# Patient Record
Sex: Male | Born: 1955 | Race: Black or African American | Hispanic: No | Marital: Single | Smoking: Never smoker
Health system: Southern US, Community
[De-identification: ages and names within clinical notes are randomized; demographics above are authoritative.]

## PROBLEM LIST (undated history)

## (undated) DIAGNOSIS — I10 Essential (primary) hypertension: Secondary | ICD-10-CM

## (undated) DIAGNOSIS — I499 Cardiac arrhythmia, unspecified: Secondary | ICD-10-CM

## (undated) DIAGNOSIS — K922 Gastrointestinal hemorrhage, unspecified: Secondary | ICD-10-CM

## (undated) DIAGNOSIS — G4730 Sleep apnea, unspecified: Secondary | ICD-10-CM

## (undated) DIAGNOSIS — M109 Gout, unspecified: Secondary | ICD-10-CM

## (undated) DIAGNOSIS — I509 Heart failure, unspecified: Secondary | ICD-10-CM

## (undated) DIAGNOSIS — M1990 Unspecified osteoarthritis, unspecified site: Secondary | ICD-10-CM

## (undated) DIAGNOSIS — M75101 Unspecified rotator cuff tear or rupture of right shoulder, not specified as traumatic: Secondary | ICD-10-CM

## (undated) HISTORY — PX: JOINT REPLACEMENT: SHX530

## (undated) HISTORY — PX: ORIF ANKLE FRACTURE: SHX5408

## (undated) HISTORY — PX: FRACTURE SURGERY: SHX138

## (undated) HISTORY — PX: FOOT SURGERY: SHX648

---

## 2007-02-09 ENCOUNTER — Emergency Department (HOSPITAL_COMMUNITY): Admission: EM | Admit: 2007-02-09 | Discharge: 2007-02-09 | Payer: Self-pay | Admitting: Emergency Medicine

## 2007-02-13 ENCOUNTER — Emergency Department (HOSPITAL_COMMUNITY): Admission: EM | Admit: 2007-02-13 | Discharge: 2007-02-13 | Payer: Self-pay | Admitting: Emergency Medicine

## 2009-12-12 ENCOUNTER — Emergency Department (HOSPITAL_COMMUNITY): Admission: EM | Admit: 2009-12-12 | Discharge: 2009-12-12 | Payer: Self-pay | Admitting: Emergency Medicine

## 2010-10-16 LAB — DIFFERENTIAL
Basophils Absolute: 0 10*3/uL (ref 0.0–0.1)
Eosinophils Absolute: 0.1 10*3/uL (ref 0.0–0.7)
Lymphocytes Relative: 17 % (ref 12–46)
Lymphs Abs: 1 10*3/uL (ref 0.7–4.0)
Monocytes Absolute: 0.5 10*3/uL (ref 0.1–1.0)
Monocytes Relative: 9 % (ref 3–12)

## 2010-10-16 LAB — POCT I-STAT, CHEM 8
BUN: 16 mg/dL (ref 6–23)
Calcium, Ion: 1.1 mmol/L — ABNORMAL LOW (ref 1.12–1.32)
Chloride: 101 mEq/L (ref 96–112)
Creatinine, Ser: 1.3 mg/dL (ref 0.4–1.5)
Hemoglobin: 12.6 g/dL — ABNORMAL LOW (ref 13.0–17.0)
Potassium: 3.4 mEq/L — ABNORMAL LOW (ref 3.5–5.1)
Sodium: 138 mEq/L (ref 135–145)

## 2010-10-16 LAB — CBC: WBC: 6.1 10*3/uL (ref 4.0–10.5)

## 2010-10-18 ENCOUNTER — Emergency Department (HOSPITAL_COMMUNITY)
Admission: EM | Admit: 2010-10-18 | Discharge: 2010-10-18 | Disposition: A | Discharge status: Home / Self Care | Payer: Medicare Other | Attending: Emergency Medicine | Admitting: Emergency Medicine

## 2010-10-18 ENCOUNTER — Emergency Department (HOSPITAL_COMMUNITY): Payer: Medicare Other

## 2010-10-18 DIAGNOSIS — S93409A Sprain of unspecified ligament of unspecified ankle, initial encounter: Secondary | ICD-10-CM | POA: Insufficient documentation

## 2010-10-18 DIAGNOSIS — M7989 Other specified soft tissue disorders: Secondary | ICD-10-CM | POA: Insufficient documentation

## 2010-10-18 DIAGNOSIS — M25579 Pain in unspecified ankle and joints of unspecified foot: Secondary | ICD-10-CM | POA: Insufficient documentation

## 2010-10-18 DIAGNOSIS — I10 Essential (primary) hypertension: Secondary | ICD-10-CM | POA: Insufficient documentation

## 2010-10-18 DIAGNOSIS — X500XXA Overexertion from strenuous movement or load, initial encounter: Secondary | ICD-10-CM | POA: Insufficient documentation

## 2010-11-24 ENCOUNTER — Other Ambulatory Visit: Payer: Self-pay | Admitting: Orthopedic Surgery

## 2010-11-24 DIAGNOSIS — M25571 Pain in right ankle and joints of right foot: Secondary | ICD-10-CM

## 2010-11-26 ENCOUNTER — Ambulatory Visit
Admission: RE | Admit: 2010-11-26 | Discharge: 2010-11-26 | Disposition: A | Discharge status: Home / Self Care | Payer: Medicaid Other | Source: Ambulatory Visit | Attending: Orthopedic Surgery | Admitting: Orthopedic Surgery

## 2010-11-26 DIAGNOSIS — M25571 Pain in right ankle and joints of right foot: Secondary | ICD-10-CM

## 2010-11-29 ENCOUNTER — Observation Stay (HOSPITAL_COMMUNITY)
Admission: RE | Admit: 2010-11-29 | Discharge: 2010-11-30 | Disposition: A | Discharge status: Home / Self Care | Payer: Medicare Other | Source: Ambulatory Visit | Attending: Orthopedic Surgery | Admitting: Orthopedic Surgery

## 2010-11-29 ENCOUNTER — Ambulatory Visit (HOSPITAL_COMMUNITY): Payer: Medicare Other

## 2010-11-29 DIAGNOSIS — Z01818 Encounter for other preprocedural examination: Secondary | ICD-10-CM | POA: Insufficient documentation

## 2010-11-29 DIAGNOSIS — Z79899 Other long term (current) drug therapy: Secondary | ICD-10-CM | POA: Insufficient documentation

## 2010-11-29 DIAGNOSIS — M109 Gout, unspecified: Secondary | ICD-10-CM | POA: Insufficient documentation

## 2010-11-29 DIAGNOSIS — Z01812 Encounter for preprocedural laboratory examination: Secondary | ICD-10-CM | POA: Insufficient documentation

## 2010-11-29 DIAGNOSIS — I10 Essential (primary) hypertension: Secondary | ICD-10-CM | POA: Insufficient documentation

## 2010-11-29 DIAGNOSIS — E669 Obesity, unspecified: Secondary | ICD-10-CM | POA: Insufficient documentation

## 2010-11-29 DIAGNOSIS — G4733 Obstructive sleep apnea (adult) (pediatric): Secondary | ICD-10-CM | POA: Insufficient documentation

## 2010-11-29 DIAGNOSIS — M129 Arthropathy, unspecified: Secondary | ICD-10-CM | POA: Insufficient documentation

## 2010-11-29 DIAGNOSIS — M66369 Spontaneous rupture of flexor tendons, unspecified lower leg: Principal | ICD-10-CM | POA: Insufficient documentation

## 2010-11-29 DIAGNOSIS — Z01810 Encounter for preprocedural cardiovascular examination: Secondary | ICD-10-CM | POA: Insufficient documentation

## 2010-11-29 LAB — CBC
Hemoglobin: 14.6 g/dL (ref 13.0–17.0)
MCHC: 34.8 g/dL (ref 30.0–36.0)
Platelets: 158 10*3/uL (ref 150–400)
RBC: 4.88 MIL/uL (ref 4.22–5.81)
WBC: 4.7 10*3/uL (ref 4.0–10.5)

## 2010-11-29 LAB — COMPREHENSIVE METABOLIC PANEL
AST: 18 U/L (ref 0–37)
Albumin: 3.6 g/dL (ref 3.5–5.2)
CO2: 28 mEq/L (ref 19–32)
Calcium: 9.6 mg/dL (ref 8.4–10.5)
Glucose, Bld: 104 mg/dL — ABNORMAL HIGH (ref 70–99)
Sodium: 138 mEq/L (ref 135–145)
Total Bilirubin: 1.1 mg/dL (ref 0.3–1.2)

## 2010-11-29 LAB — PROTIME-INR
INR: 1.03 (ref 0.00–1.49)
Prothrombin Time: 13.7 seconds (ref 11.6–15.2)

## 2010-11-29 LAB — APTT: aPTT: 32 seconds (ref 24–37)

## 2010-12-12 NOTE — Op Note (Signed)
[name], Leon Thomas                  ACCOUNT NO.:  [id number]  MEDICAL RECORD NO.:  [id number]           PATIENT TYPE:  O  LOCATION:  5004                         FACILITY:  [facility]  PHYSICIAN:  Nadara Mustard, MD     DATE OF BIRTH:  [birth year]  DATE OF PROCEDURE:  11/29/2010 DATE OF DISCHARGE:                              OPERATIVE REPORT   PREOPERATIVE DIAGNOSIS:  Chronic right Achilles tendon rupture.  POSTOPERATIVE DIAGNOSIS:  Chronic right Achilles tendon rupture.  PROCEDURE:  Right Achilles tendon reconstruction with reinforcement with ACell Xenograft tissue.  SURGEON.:  Nadara Mustard, MD  ANESTHESIA:  General plus popliteal block.  ESTIMATED BLOOD LOSS:  Minimal.  ANTIBIOTICS:  2 g of Kefzol.  DRAINS:  None.  COMPLICATIONS:  None.  TOURNIQUET TIME:  None.  DISPOSITION:  To PACU in stable condition.  INDICATIONS FOR PROCEDURE:  The patient is a 55 year old gentleman who has had a chronic Achilles tendon rupture.  MRI scan confirms complete rupture of the tendon.  Due to his pain with activities of daily living, the patient wished to proceed with surgical intervention.  Risks and benefits were discussed including infection, neurovascular injury, nonhealing of the wound, need for additional surgery.  The patient states he understands and wished to proceed at this time.  DESCRIPTION OF PROCEDURE:  The patient was brought to OR room 10 after undergone a popliteal block.  He then underwent a general anesthetic. After adequate level of anesthesia was obtained, the patient's right lower extremity was prepped using DuraPrep and draped into a sterile field.  A posterior medial incision was made.  This was carried down to the peritenon which was incised, reflected.  The patient had a chronic tear of the Achilles.  He underwent debridement and excision of the chronic fibrinous tissue.  After debridement, the wound edges and tissue edges were debrided.  The patient had good  apposition of the tendon using a crack out suture technique with #2 FiberWire.  Two FiberWires were sutured using crack out technique in the proximal and distal stumps.  This provided all 4 sutures exiting the proximal and distal stumps.  The foot was placed in relaxed plantar flexion and the Achilles was repaired with no tension on the Achilles with the foot in relaxed position.  The ACell graft was soaked on the back table.  This was fenestrated and then was wrapped around the tendon reconstruction and sutured in place with 3-0 Monocryl.  Subcu was closed using 2-0 Vicryl and the skin was closed using 2-0 nylon with a modified Allgower-Donati suture technique with no sutures crossing the skin.  The wound edges closed well.  The patient did have a significant edema in his leg.  A compressive wrap was applied with Adaptic, 4x4s, Webril, Coban and then a posterior and sugar-tong splint was applied.  The ACell graft which was a 7 x 10 cm was also utilized with the 500 g of the ACell powder and this was placed against the repair beneath the ACell tissue graft.  The patient was extubated and taken to PACU in stable condition.  Plan  for overnight observation due to his sleep apnea and use of the CPAP machine.  Plan to follow up in the office in 2 weeks.     Nadara Mustard, MD     MVD/MEDQ  D:  11/29/2010  T:  11/30/2010  Job:  [id number]  Electronically Signed by Aldean Baker MD on 12/12/2010 10:14:23 AM

## 2011-05-15 LAB — URINALYSIS, ROUTINE W REFLEX MICROSCOPIC
Bilirubin Urine: NEGATIVE
Glucose, UA: NEGATIVE
Hgb urine dipstick: NEGATIVE
Specific Gravity, Urine: 1.019
pH: 5.5

## 2011-05-15 LAB — DIFFERENTIAL
Basophils Absolute: 0
Basophils Relative: 0
Eosinophils Absolute: 0.1
Eosinophils Relative: 1
Lymphocytes Relative: 11 — ABNORMAL LOW
Lymphs Abs: 1
Neutrophils Relative %: 84 — ABNORMAL HIGH

## 2011-05-15 LAB — CBC
HCT: 40.1
MCHC: 35.2
MCV: 84.7
Platelets: 171
WBC: 9.2

## 2011-05-15 LAB — BASIC METABOLIC PANEL
GFR calc non Af Amer: >60
Glucose, Bld: 129 — ABNORMAL HIGH
Potassium: 3.4 — ABNORMAL LOW
Sodium: 140

## 2011-05-15 LAB — URINE MICROSCOPIC-ADD ON

## 2012-10-09 ENCOUNTER — Encounter (HOSPITAL_COMMUNITY): Payer: Self-pay | Admitting: Cardiology

## 2012-10-09 ENCOUNTER — Emergency Department (HOSPITAL_COMMUNITY)
Admission: EM | Admit: 2012-10-09 | Discharge: 2012-10-09 | Disposition: A | Discharge status: Home / Self Care | Payer: Medicare Other | Attending: Emergency Medicine | Admitting: Emergency Medicine

## 2012-10-09 DIAGNOSIS — Z79899 Other long term (current) drug therapy: Secondary | ICD-10-CM | POA: Insufficient documentation

## 2012-10-09 DIAGNOSIS — H539 Unspecified visual disturbance: Secondary | ICD-10-CM | POA: Insufficient documentation

## 2012-10-09 DIAGNOSIS — Z7982 Long term (current) use of aspirin: Secondary | ICD-10-CM | POA: Insufficient documentation

## 2012-10-09 DIAGNOSIS — I10 Essential (primary) hypertension: Secondary | ICD-10-CM

## 2012-10-09 DIAGNOSIS — M109 Gout, unspecified: Secondary | ICD-10-CM | POA: Insufficient documentation

## 2012-10-09 DIAGNOSIS — M129 Arthropathy, unspecified: Secondary | ICD-10-CM | POA: Insufficient documentation

## 2012-10-09 HISTORY — DX: Unspecified osteoarthritis, unspecified site: M19.90

## 2012-10-09 HISTORY — DX: Essential (primary) hypertension: I10

## 2012-10-09 HISTORY — DX: Gout, unspecified: M10.9

## 2012-10-09 NOTE — ED Notes (Signed)
Pt reports his BP has been high since Monday night. States he has been taking his medication but seems to not be working. States he has had some spots in his vision over the past couple of days. No neuro deficits noted. Pt A&Ox4. No distress noted. Also reports left knee pain and swelling.

## 2012-10-09 NOTE — ED Notes (Signed)
Patient states he has not taken his BP he just knows his pressure has been running high. States even with taking his medication his BP at the DR. Office last visit was 190's.

## 2012-10-15 NOTE — ED Provider Notes (Signed)
History     CSN: [id number]  Arrival date & time 10/09/12  1509   First MD Initiated Contact with Patient 10/09/12 1906      Chief Complaint  Patient presents with  . Hypertension    (Consider location/radiation/quality/duration/timing/severity/associated sxs/prior treatment) Patient is a 57 y.o. male presenting with hypertension. The history is provided by the patient.  Hypertension Pertinent negatives include no chest pain, no abdominal pain, no headaches and no shortness of breath.   patient with long-standing history of hypertension. Patient blood pressure that high since Monday night. Has been taking his medications as directed. Blood pressure does remain high. No cystic symptoms. Patient denies headache chest pain shortness of breath or any strokelike symptoms. He has had some 2 visual spots for the past couple days but none now.  Past Medical History  Diagnosis Date  . Hypertension   . Gout   . Arthritis     History reviewed. No pertinent past surgical history.  History reviewed. No pertinent family history.  History  Substance Use Topics  . Smoking status: Never Smoker   . Smokeless tobacco: Not on file  . Alcohol Use: Yes      Review of Systems  Constitutional: Negative for fever.  HENT: Negative for congestion.   Eyes: Positive for visual disturbance.  Respiratory: Negative for shortness of breath.   Cardiovascular: Negative for chest pain.  Gastrointestinal: Negative for nausea, vomiting and abdominal pain.  Genitourinary: Negative for dysuria.  Musculoskeletal: Negative for back pain.  Skin: Negative for rash.  Neurological: Negative for syncope, weakness, numbness and headaches.  Psychiatric/Behavioral: Negative for confusion.    Allergies  Review of patient's allergies indicates no known allergies.  Home Medications   Current Outpatient Rx  Name  Route  Sig  Dispense  Refill  . allopurinol (ZYLOPRIM) 300 MG tablet   Oral   Take 300 mg by  mouth daily.         Marland Kitchen aspirin EC 325 MG tablet   Oral   Take 325 mg by mouth daily.         Marland Kitchen atenolol (TENORMIN) 100 MG tablet   Oral   Take 100 mg by mouth daily.         . furosemide (LASIX) 80 MG tablet   Oral   Take 80 mg by mouth daily.         . hydrALAZINE (APRESOLINE) 100 MG tablet   Oral   Take 100 mg by mouth 3 (three) times daily.         Marland Kitchen HYDROcodone-acetaminophen (NORCO) 10-325 MG per tablet   Oral   Take 1 tablet by mouth every 6 (six) hours as needed for pain. For pain         . polyethylene glycol (MIRALAX / GLYCOLAX) packet   Oral   Take 17 g by mouth daily as needed. For constipation           BP 158/108  Pulse 70  Temp(Src) 98 F (36.7 C) (Oral)  Resp 18  SpO2 99%  Physical Exam  Nursing note and vitals reviewed. Constitutional: He is oriented to person, place, and time. He appears well-developed and well-nourished. No distress.  HENT:  Head: Normocephalic and atraumatic.  Mouth/Throat: Oropharynx is clear and moist.  Eyes: Conjunctivae and EOM are normal. Pupils are equal, round, and reactive to light. Right eye exhibits no discharge. Left eye exhibits no discharge. No scleral icterus.  Neck: Normal range of motion. Neck supple.  Cardiovascular:  Normal rate, regular rhythm, normal heart sounds and intact distal pulses.   No murmur heard. Pulmonary/Chest: Effort normal and breath sounds normal.  Abdominal: Soft. Bowel sounds are normal. There is no tenderness.  Musculoskeletal: Normal range of motion.  Neurological: He is alert and oriented to person, place, and time. No cranial nerve deficit. He exhibits normal muscle tone. Coordination normal.  Skin: Skin is warm. No rash noted.    ED Course  Procedures (including critical care time)  Labs Reviewed - No data to display No results found. Results for orders placed during the hospital encounter of 11/29/10  SURGICAL PCR SCREEN      Result Value Range   MRSA, PCR POSITIVE  (*) NEGATIVE   Staphylococcus aureus   (*) NEGATIVE   Value: POSITIVE            The Xpert SA Assay (FDA     approved for NASAL specimens     only), is one component of     a comprehensive surveillance     program.  It is not intended     to diagnose infection nor to     guide or monitor treatment.  CBC      Result Value Range   WBC 4.7  4.0 - 10.5 K/uL   RBC 4.88  4.22 - 5.81 MIL/uL   Hemoglobin 14.6  13.0 - 17.0 g/dL   HCT 40.9  81.1 - 91.4 %   MCV 85.9  78.0 - 100.0 fL   MCH 29.9  26.0 - 34.0 pg   MCHC 34.8  30.0 - 36.0 g/dL   RDW 78.2  95.6 - 21.3 %   Platelets 158  150 - 400 K/uL  PROTIME-INR      Result Value Range   Prothrombin Time 13.7  11.6 - 15.2 seconds   INR 1.03  0.00 - 1.49  APTT      Result Value Range   aPTT 32  24 - 37 seconds  COMPREHENSIVE METABOLIC PANEL      Result Value Range   Sodium 138  135 - 145 mEq/L   Potassium 3.9  3.5 - 5.1 mEq/L   Chloride 102  96 - 112 mEq/L   CO2 28  19 - 32 mEq/L   Glucose, Bld 104 (*) 70 - 99 mg/dL   BUN 13  6 - 23 mg/dL   Creatinine, Ser 0.86  0.4 - 1.5 mg/dL   Calcium 9.6  8.4 - 57.8 mg/dL   Total Protein 7.0  6.0 - 8.3 g/dL   Albumin 3.6  3.5 - 5.2 g/dL   AST 18  0 - 37 U/L   ALT 12  0 - 53 U/L   Alkaline Phosphatase 57  39 - 117 U/L   Total Bilirubin 1.1  0.3 - 1.2 mg/dL   GFR calc non Af Amer >60  >60 mL/min   GFR calc Af Amer    >60 mL/min   Value: >60            The eGFR has been calculated     using the MDRD equation.     This calculation has not been     validated in all clinical     situations.     eGFR's persistently     <60 mL/min signify     possible Chronic Kidney Disease.     1. Hypertension       MDM   Patient with a history of hypertension. He has been  taking his hypertensive medication which includes Lasix atenolol and hydralazine. Patient does have a primary care Dr. to followup with. Patient's blood pressures been high since Monday night. Patient not having any endorgan type  symptoms. No chest pain no headache no shortness of breath. No focal deficits consistent with a stroke. Patient did mention a few spots in his vision over the past couple days but there is none now. Patient can be discharged home with followup with her primary care Dr.        Shelda Jakes, MD 10/15/12 0900

## 2013-02-21 IMAGING — CR DG FOOT COMPLETE 3+V*R*
3 series · 3 of 3 positions shown · non-contrast
Comparison: Right ankle radiographs 10/18/2010

CLINICAL DATA: Trauma.  Fell and slipped on [REDACTED].  Swelling.

RIGHT FOOT COMPLETE - 3+ VIEW

[t foot ap right]
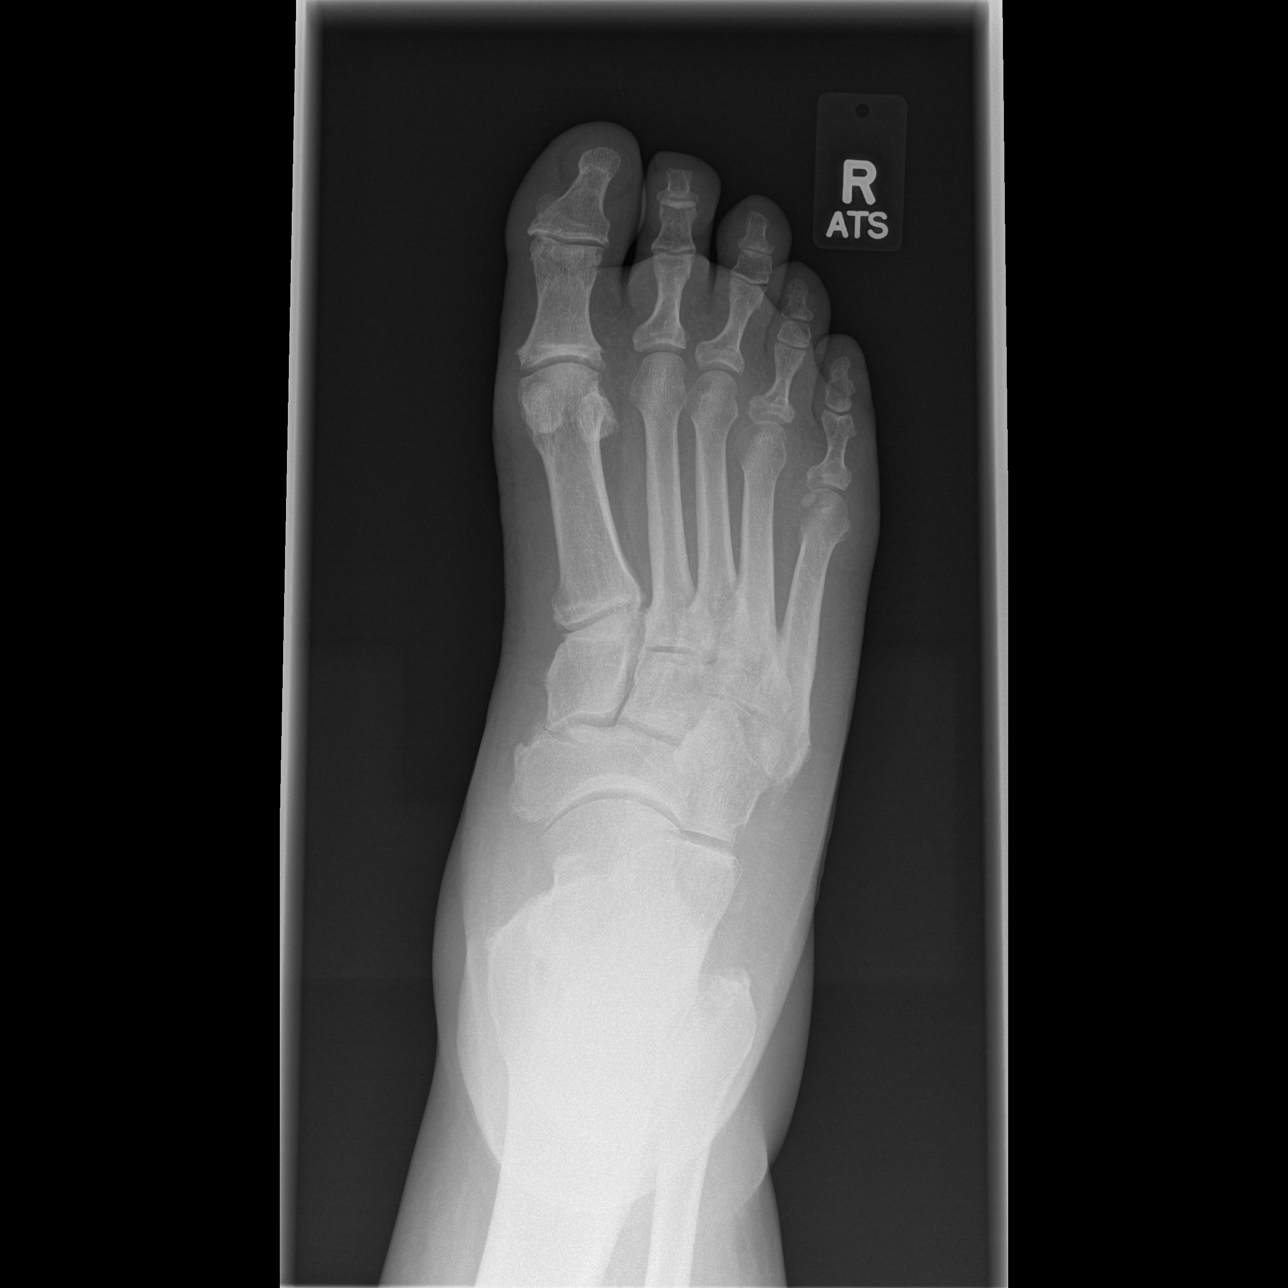

[t foot oblique right]
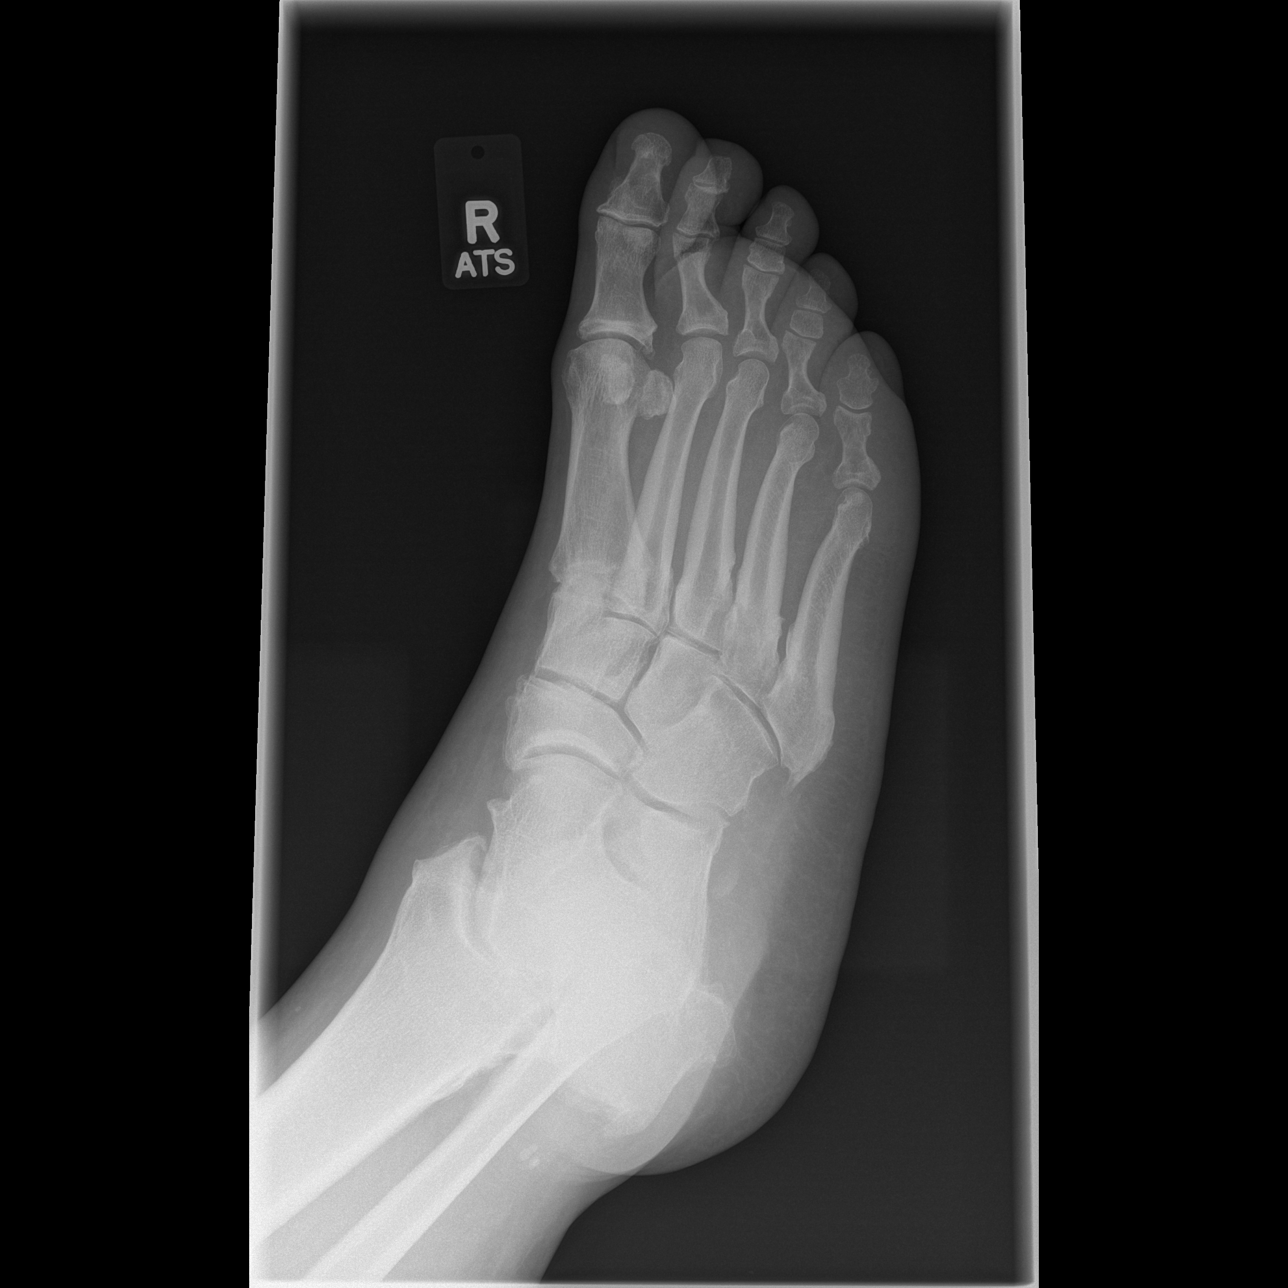

[t foot lat right]
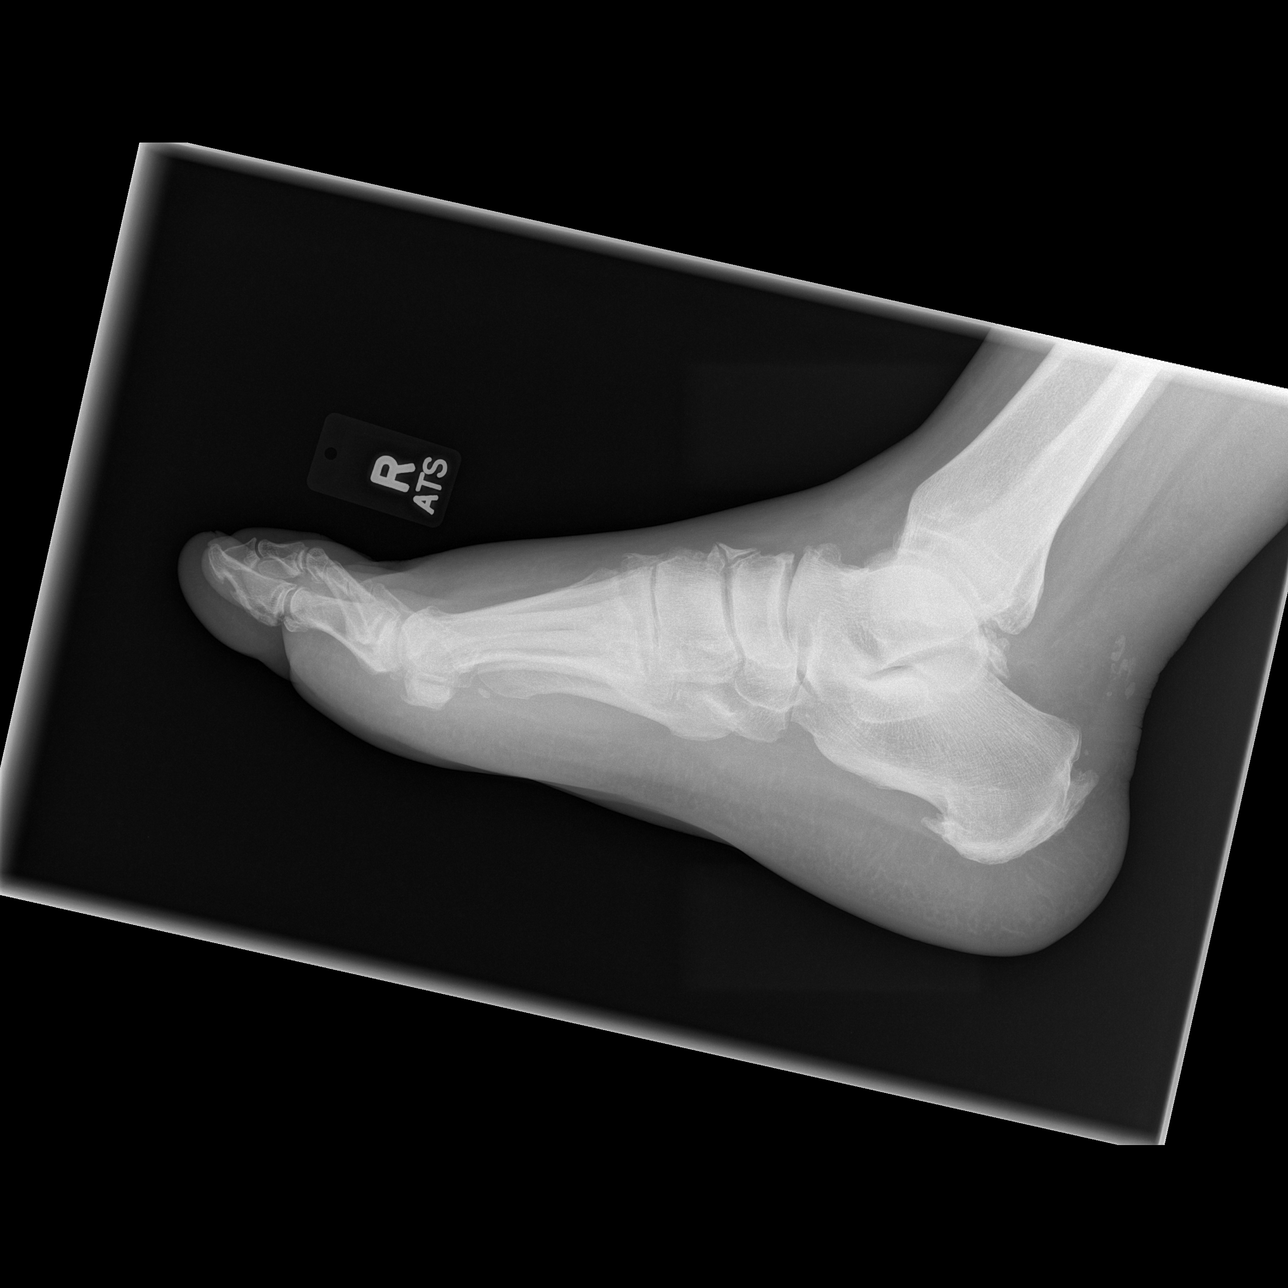

[3 of 3 positions shown; findings below may reference images not displayed]

FINDINGS: Joints of the foot are aligned.  There are degenerative
changes of the midfoot.  Small plantar calcaneal spur noted.
Enthesopathic changes at the Achilles tendon insertion site on the
calcaneus.  No fracture or suspicious bony abnormality.

Mild diffuse soft tissue of tissue swelling about the proximal
foot.
IMPRESSION: No acute bony abnormality identified. Midfoot degenerative change.

Soft tissue swelling about the proximal foot.

## 2013-04-21 ENCOUNTER — Ambulatory Visit (HOSPITAL_COMMUNITY)
Admission: RE | Admit: 2013-04-21 | Discharge: 2013-04-21 | Disposition: A | Discharge status: Home / Self Care | Payer: Medicare Other | Source: Ambulatory Visit | Attending: Cardiology | Admitting: Cardiology

## 2013-04-21 ENCOUNTER — Ambulatory Visit (HOSPITAL_COMMUNITY): Payer: Medicare Other | Admitting: *Deleted

## 2013-04-21 ENCOUNTER — Encounter (HOSPITAL_COMMUNITY): Payer: Self-pay | Admitting: *Deleted

## 2013-04-21 ENCOUNTER — Encounter (HOSPITAL_COMMUNITY)
Admission: RE | Disposition: A | Discharge status: Home / Self Care | Payer: Medicare Other | Source: Ambulatory Visit | Attending: Cardiology

## 2013-04-21 DIAGNOSIS — I4891 Unspecified atrial fibrillation: Secondary | ICD-10-CM | POA: Insufficient documentation

## 2013-04-21 HISTORY — PX: CARDIOVERSION: SHX1299

## 2013-04-21 SURGERY — CARDIOVERSION
Anesthesia: General

## 2013-04-21 MED ORDER — SODIUM CHLORIDE 0.9 % IV SOLN: INTRAVENOUS | Status: DC

## 2013-04-21 MED ORDER — SODIUM CHLORIDE 0.9 % IV SOLN: 250.0000 mL | INTRAVENOUS | Status: DC | PRN

## 2013-04-21 MED ORDER — LIDOCAINE HCL (CARDIAC) 20 MG/ML IV SOLN
INTRAVENOUS | Status: DC | PRN
  Administered 2013-04-21: 40 mg via INTRAVENOUS

## 2013-04-21 MED ORDER — SODIUM CHLORIDE 0.9 % IJ SOLN: 3.0000 mL | Freq: Two times a day (BID) | INTRAMUSCULAR | Status: DC

## 2013-04-21 MED ORDER — ASPIRIN 81 MG PO CHEW: 324.0000 mg | CHEWABLE_TABLET | ORAL | Status: DC

## 2013-04-21 MED ORDER — PROPOFOL 10 MG/ML IV BOLUS
INTRAVENOUS | Status: DC | PRN
  Administered 2013-04-21: 60 mg via INTRAVENOUS

## 2013-04-21 MED ORDER — SODIUM CHLORIDE 0.9 % IJ SOLN: 3.0000 mL | INTRAMUSCULAR | Status: DC | PRN

## 2013-04-21 NOTE — Interval H&P Note (Signed)
History and Physical Interval Note:  04/21/2013 9:02 AM  Leon Thomas  has presented today for surgery, with the diagnosis of AFIB  The various methods of treatment have been discussed with the patient and family. After consideration of risks, benefits and other options for treatment, the patient has consented to  Procedure(s) with comments: CARDIOVERSION (N/A) - h&p in file- HW as a surgical intervention .  The patient's history has been reviewed, patient examined, no change in status, stable for surgery.  I have reviewed the patient's chart and labs.  Questions were answered to the patient's satisfaction.     [name]

## 2013-04-21 NOTE — H&P (Signed)
  Please see office visit notes for complete details of HPI.  

## 2013-04-21 NOTE — Anesthesia Preprocedure Evaluation (Addendum)
Anesthesia Evaluation  Patient identified by MRN, date of birth, ID band Patient awake    Reviewed: Allergy & Precautions, H&P , NPO status , Patient's Chart, lab work & pertinent test results, reviewed documented beta blocker date and time   Airway Mallampati: II TM Distance: >3 FB Neck ROM: Full    Dental  (+) Teeth Intact and Dental Advisory Given   Pulmonary          Cardiovascular hypertension, Pt. on medications + dysrhythmias Atrial Fibrillation     Neuro/Psych    GI/Hepatic   Endo/Other    Renal/GU      Musculoskeletal   Abdominal   Peds  Hematology   Anesthesia Other Findings   Reproductive/Obstetrics                          Anesthesia Physical Anesthesia Plan  ASA: II  Anesthesia Plan: General   Post-op Pain Management:    Induction: Intravenous  Airway Management Planned: Mask  Additional Equipment:   Intra-op Plan:   Post-operative Plan:   Informed Consent: I have reviewed the patients History and Physical, chart, labs and discussed the procedure including the risks, benefits and alternatives for the proposed anesthesia with the patient or authorized representative who has indicated his/her understanding and acceptance.   Dental advisory given  Plan Discussed with: CRNA, Anesthesiologist and Surgeon  Anesthesia Plan Comments:        Anesthesia Quick Evaluation

## 2013-04-21 NOTE — Anesthesia Postprocedure Evaluation (Signed)
  Anesthesia Post-op Note  Patient: Leon Thomas  Procedure(s) Performed: Procedure(s) with comments: CARDIOVERSION (N/A) - h&p in file- HW  Patient Location: Endoscopy Unit  Anesthesia Type:MAC  Level of Consciousness: awake and alert   Airway and Oxygen Therapy: Patient Spontanous Breathing and Patient connected to nasal cannula oxygen  Post-op Pain: none  Post-op Assessment: Post-op Vital signs reviewed and Patient's Cardiovascular Status Stable  Post-op Vital Signs: Reviewed and stable  Complications: No apparent anesthesia complications

## 2013-04-21 NOTE — Preoperative (Signed)
Beta Blockers   Reason not to administer Beta Blockers:Not Applicable 

## 2013-04-21 NOTE — Transfer of Care (Signed)
Immediate Anesthesia Transfer of Care Note  Patient: Leon Thomas  Procedure(s) Performed: Procedure(s) with comments: CARDIOVERSION (N/A) - h&p in file- HW  Patient Location: PACU  Anesthesia Type:MAC  Level of Consciousness: awake  Airway & Oxygen Therapy: Patient Spontanous Breathing and Patient connected to nasal cannula oxygen  Post-op Assessment: Report given to PACU RN and Post -op Vital signs reviewed and stable  Post vital signs: Reviewed  Complications: No apparent anesthesia complications

## 2013-04-21 NOTE — CV Procedure (Signed)
Direct current cardioversion:  Indication symptomatic A. Fibrillation.  Procedure: Using 100 mg of IV Propofol and 40 mg lidocaine for achieving deep (Moderate sedation), synchronized direct current cardioversion performed. Patient was delivered with 200 Joules of electricity X 1 with success to NSR. Patient tolerated the procedure well. No immediate complication noted.

## 2013-04-22 ENCOUNTER — Encounter (HOSPITAL_COMMUNITY): Payer: Self-pay | Admitting: Cardiology

## 2013-08-21 ENCOUNTER — Encounter (HOSPITAL_COMMUNITY): Payer: Self-pay | Admitting: *Deleted

## 2013-08-24 ENCOUNTER — Encounter (HOSPITAL_COMMUNITY): Payer: Self-pay | Admitting: Pharmacy Technician

## 2013-09-02 ENCOUNTER — Other Ambulatory Visit: Payer: Self-pay | Admitting: Gastroenterology

## 2013-09-02 NOTE — Addendum Note (Signed)
Addended by: Samuel GermanyEDWARDS, Mikena Masoner JR. on: 09/02/2013 09:45 AM   Modules accepted: Orders

## 2013-09-04 ENCOUNTER — Encounter (HOSPITAL_COMMUNITY): Payer: Self-pay | Admitting: Gastroenterology

## 2013-09-04 ENCOUNTER — Encounter (HOSPITAL_COMMUNITY)
Admission: RE | Disposition: A | Discharge status: Home / Self Care | Payer: Self-pay | Source: Ambulatory Visit | Attending: Gastroenterology

## 2013-09-04 ENCOUNTER — Ambulatory Visit (HOSPITAL_COMMUNITY)
Admission: RE | Admit: 2013-09-04 | Discharge: 2013-09-04 | Disposition: A | Discharge status: Home / Self Care | Payer: Medicare Other | Source: Ambulatory Visit | Attending: Gastroenterology | Admitting: Gastroenterology

## 2013-09-04 ENCOUNTER — Ambulatory Visit (HOSPITAL_COMMUNITY): Payer: Medicare Other | Admitting: Anesthesiology

## 2013-09-04 ENCOUNTER — Encounter (HOSPITAL_COMMUNITY): Payer: Medicare Other | Admitting: Anesthesiology

## 2013-09-04 DIAGNOSIS — Z7901 Long term (current) use of anticoagulants: Secondary | ICD-10-CM | POA: Insufficient documentation

## 2013-09-04 DIAGNOSIS — Z860100 Personal history of colon polyps, unspecified: Secondary | ICD-10-CM | POA: Insufficient documentation

## 2013-09-04 DIAGNOSIS — G4730 Sleep apnea, unspecified: Secondary | ICD-10-CM | POA: Insufficient documentation

## 2013-09-04 DIAGNOSIS — E119 Type 2 diabetes mellitus without complications: Secondary | ICD-10-CM | POA: Insufficient documentation

## 2013-09-04 DIAGNOSIS — Z862 Personal history of diseases of the blood and blood-forming organs and certain disorders involving the immune mechanism: Secondary | ICD-10-CM | POA: Insufficient documentation

## 2013-09-04 DIAGNOSIS — Z09 Encounter for follow-up examination after completed treatment for conditions other than malignant neoplasm: Secondary | ICD-10-CM | POA: Insufficient documentation

## 2013-09-04 DIAGNOSIS — I10 Essential (primary) hypertension: Secondary | ICD-10-CM | POA: Insufficient documentation

## 2013-09-04 DIAGNOSIS — K5730 Diverticulosis of large intestine without perforation or abscess without bleeding: Secondary | ICD-10-CM | POA: Insufficient documentation

## 2013-09-04 DIAGNOSIS — M1710 Unilateral primary osteoarthritis, unspecified knee: Secondary | ICD-10-CM | POA: Insufficient documentation

## 2013-09-04 DIAGNOSIS — Z8639 Personal history of other endocrine, nutritional and metabolic disease: Secondary | ICD-10-CM | POA: Insufficient documentation

## 2013-09-04 DIAGNOSIS — Z79899 Other long term (current) drug therapy: Secondary | ICD-10-CM | POA: Insufficient documentation

## 2013-09-04 DIAGNOSIS — I4891 Unspecified atrial fibrillation: Secondary | ICD-10-CM | POA: Insufficient documentation

## 2013-09-04 DIAGNOSIS — K648 Other hemorrhoids: Secondary | ICD-10-CM | POA: Insufficient documentation

## 2013-09-04 HISTORY — DX: Sleep apnea, unspecified: G47.30

## 2013-09-04 HISTORY — PX: COLONOSCOPY WITH PROPOFOL: SHX5780

## 2013-09-04 HISTORY — DX: Cardiac arrhythmia, unspecified: I49.9

## 2013-09-04 SURGERY — COLONOSCOPY WITH PROPOFOL
Anesthesia: Monitor Anesthesia Care

## 2013-09-04 MED ORDER — FENTANYL CITRATE 0.05 MG/ML IJ SOLN: 25.0000 ug | INTRAMUSCULAR | Status: DC | PRN

## 2013-09-04 MED ORDER — PROPOFOL 10 MG/ML IV EMUL
INTRAVENOUS | Status: DC | PRN
  Administered 2013-09-04: 40 mg via INTRAVENOUS

## 2013-09-04 MED ORDER — ONDANSETRON HCL 4 MG/2ML IJ SOLN
INTRAMUSCULAR | Status: DC | PRN
  Administered 2013-09-04: 4 mg via INTRAVENOUS

## 2013-09-04 MED ORDER — PROPOFOL 10 MG/ML IV BOLUS
INTRAVENOUS | Status: AC
  Filled 2013-09-04: qty 20

## 2013-09-04 MED ORDER — ONDANSETRON HCL 4 MG/2ML IJ SOLN
INTRAMUSCULAR | Status: AC
  Filled 2013-09-04: qty 2

## 2013-09-04 MED ORDER — PROPOFOL INFUSION 10 MG/ML OPTIME
INTRAVENOUS | Status: DC | PRN
  Administered 2013-09-04: 100 ug/kg/min via INTRAVENOUS

## 2013-09-04 MED ORDER — LACTATED RINGERS IV SOLN
INTRAVENOUS | Status: DC | PRN
  Administered 2013-09-04: 11:00:00 via INTRAVENOUS

## 2013-09-04 MED ORDER — LACTATED RINGERS IV SOLN: INTRAVENOUS | Status: DC

## 2013-09-04 MED ORDER — SODIUM CHLORIDE 0.9 % IV SOLN: INTRAVENOUS | Status: DC

## 2013-09-04 MED ORDER — KETAMINE HCL 10 MG/ML IJ SOLN
INTRAMUSCULAR | Status: DC | PRN
  Administered 2013-09-04: 20 mg via INTRAVENOUS

## 2013-09-04 MED ORDER — LIDOCAINE HCL (CARDIAC) 20 MG/ML IV SOLN
INTRAVENOUS | Status: AC
  Filled 2013-09-04: qty 5

## 2013-09-04 SURGICAL SUPPLY — 21 items

## 2013-09-04 NOTE — Anesthesia Preprocedure Evaluation (Addendum)
Anesthesia Evaluation  Patient identified by MRN, date of birth, ID band Patient awake    Reviewed: Allergy & Precautions, H&P , NPO status , Patient's Chart, lab work & pertinent test results  Airway Mallampati: II TM Distance: >3 FB Neck ROM: full    Dental no notable dental hx. (+) Teeth Intact and Dental Advisory Given   Pulmonary neg pulmonary ROS, sleep apnea and Continuous Positive Airway Pressure Ventilation ,  breath sounds clear to auscultation  Pulmonary exam normal       Cardiovascular Exercise Tolerance: Good hypertension, Pt. on medications negative cardio ROS  + dysrhythmias Rhythm:regular Rate:Normal     Neuro/Psych negative neurological ROS  negative psych ROS   GI/Hepatic negative GI ROS, Neg liver ROS,   Endo/Other  negative endocrine ROSMorbid obesity  Renal/GU negative Renal ROS  negative genitourinary   Musculoskeletal   Abdominal (+) + obese,   Peds  Hematology negative hematology ROS (+)   Anesthesia Other Findings   Reproductive/Obstetrics negative OB ROS                         Anesthesia Physical Anesthesia Plan  ASA: III  Anesthesia Plan: MAC   Post-op Pain Management:    Induction:   Airway Management Planned: Nasal Cannula  Additional Equipment:   Intra-op Plan:   Post-operative Plan:   Informed Consent: I have reviewed the patients History and Physical, chart, labs and discussed the procedure including the risks, benefits and alternatives for the proposed anesthesia with the patient or authorized representative who has indicated his/her understanding and acceptance.   Dental Advisory Given  Plan Discussed with: CRNA and Surgeon  Anesthesia Plan Comments:         Anesthesia Quick Evaluation

## 2013-09-04 NOTE — Discharge Instructions (Signed)
You should repeat the colonoscopy in the next few years.  Our office will notify you by mail when it is time.  Please call at [phone] for any problems  Resume Xarelto today

## 2013-09-04 NOTE — Op Note (Signed)
[facility] [address] [place], [id number]   COLONOSCOPY PROCEDURE REPORT  PATIENT: Leon Thomas, Emran R.  MR#: [id number] BIRTHDATE: 1955-10-24 , 57  yrs. old GENDER: Male ENDOSCOPIST: Carman ChingJames Nikky Duba, MD REFERRED BY:   Dr. Elinor ParkinsonKunesh PROCEDURE DATE:  09/04/2013 PROCEDURE:     Colonoscopy ASA CLASS:    class III INDICATIONS:  history of previous: polyp removed 5 years ago in [place] MEDICATIONS:   propofol with MAC  DESCRIPTION OF PROCEDURE: The Pentax developed: the scope inserted following digital rectal exam in advance. Prep adequate. Advanced to the cecum. I CV and appendiceal orifice identified. Few diverticula were seen and the sigmoid colon. Minimal internal hemorrhoids and retroflex view. No polyps or masses seen.     COMPLICATIONS: None  ENDOSCOPIC IMPRESSION: 1. History of Colon Polyps. Negative colonoscopy at this time 2. Minimal diverticulosis  RECOMMENDATIONS: recommend repeating procedure in 5 years    _______________________________ eSigned:  Carman ChingJames Nicolet Griffy, MD 09/04/2013 11:48 AM  CC: Dr. Doyce ParaBenjamin Kunesh

## 2013-09-04 NOTE — Transfer of Care (Signed)
Immediate Anesthesia Transfer of Care Note  Patient: Leon HalonJimmy R Thomas  Procedure(s) Performed: Procedure(s) (LRB): COLONOSCOPY WITH PROPOFOL (N/A)  Patient Location: PACU  Anesthesia Type: MAC  Level of Consciousness: sedated, patient cooperative and responds to stimulation  Airway & Oxygen Therapy: Patient Spontanous Breathing and Patient connected to face mask oxgen  Post-op Assessment: Report given to PACU RN and Post -op Vital signs reviewed and stable  Post vital signs: Reviewed and stable  Complications: No apparent anesthesia complications

## 2013-09-04 NOTE — H&P (Signed)
  Subjective:   Patient is a 58 y.o. male presents with a history of previous: polyp removed 5 years ago in another location to for 5 year follow-up colonoscopy. As additional problems of atrial fibrillation and anticoagulation, sleep apnea, diabetes and morbid obesity. His Xarelto has been on hold for 4 days.. Procedure including risks and benefits discussed in office.  There are no active problems to display for this patient.  Past Medical History  Diagnosis Date  . Hypertension   . Gout   . Arthritis     knees  . Sleep apnea     cpap nightly, settings 3  . Dysrhythmia     04[facility] Jacinto HalimGanji. No further problems    Past Surgical History  Procedure Laterality Date  . Cardioversion N/A 04/21/2013    Procedure: CARDIOVERSION;  Surgeon: Pamella PertJagadeesh R Ganji, MD;  [facility];  Service: Cardiovascular;  Laterality: N/A;  h&p in file- HW  . Joint replacement Left   . Foot surgery Left   . Orif ankle fracture Right     Prescriptions prior to admission  Medication Sig Dispense Refill  . allopurinol (ZYLOPRIM) 300 MG tablet Take 300 mg by mouth daily.      . carvedilol (COREG) 12.5 MG tablet Take 12.5 mg by mouth 2 (two) times daily with a meal.      . cloNIDine (CATAPRES - DOSED IN MG/24 HR) 0.2 mg/24hr patch Place 0.2 mg onto the skin every other day.      Marland Kitchen. HYDROcodone-acetaminophen (NORCO) 10-325 MG per tablet Take 1 tablet by mouth every 6 (six) hours as needed for pain. For pain      . losartan (COZAAR) 100 MG tablet Take 100 mg by mouth every morning.       . polyethylene glycol (MIRALAX / GLYCOLAX) packet Take 17 g by mouth daily as needed. For constipation      . Rivaroxaban (XARELTO) 20 MG TABS tablet Take 20 mg by mouth daily with supper.      Marland Kitchen. spironolactone (ALDACTONE) 50 MG tablet Take 50 mg by mouth every morning.       No Known Allergies  History  Substance Use Topics  . Smoking status: Never Smoker   . Smokeless  tobacco: Not on file  . Alcohol Use: 0.6 oz/week    1 Cans of beer per week     Comment: daily 1 beer    History reviewed. No pertinent family history.   Objective:   No data found.        See MD Preop evaluation      Assessment:   1. History of prior colon polyps  Plan:   Will proceed with surveillance colonoscopy. Has been discussed in detail with the patient in the office.

## 2013-09-04 NOTE — Preoperative (Signed)
Beta Blockers   Reason not to administer Beta Blockers:Not Applicable 

## 2013-09-04 NOTE — Anesthesia Postprocedure Evaluation (Signed)
  Anesthesia Post-op Note  Patient: Leon Thomas  Procedure(s) Performed: Procedure(s) (LRB): COLONOSCOPY WITH PROPOFOL (N/A)  Patient Location: PACU  Anesthesia Type: MAC  Level of Consciousness: awake and alert   Airway and Oxygen Therapy: Patient Spontanous Breathing  Post-op Pain: mild  Post-op Assessment: Post-op Vital signs reviewed, Patient's Cardiovascular Status Stable, Respiratory Function Stable, Patent Airway and No signs of Nausea or vomiting  Last Vitals:  Filed Vitals:   09/04/13 1200  BP: 186/113  Pulse:   Temp:   Resp: 14    Post-op Vital Signs: stable   Complications: No apparent anesthesia complications

## 2013-09-07 ENCOUNTER — Encounter (HOSPITAL_COMMUNITY): Payer: Self-pay | Admitting: Gastroenterology

## 2013-11-05 ENCOUNTER — Ambulatory Visit (INDEPENDENT_AMBULATORY_CARE_PROVIDER_SITE_OTHER): Payer: Medicare Other | Admitting: Otolaryngology

## 2013-11-05 ENCOUNTER — Other Ambulatory Visit (INDEPENDENT_AMBULATORY_CARE_PROVIDER_SITE_OTHER): Payer: Self-pay | Admitting: Otolaryngology

## 2013-11-05 DIAGNOSIS — R430 Anosmia: Secondary | ICD-10-CM

## 2013-11-16 ENCOUNTER — Ambulatory Visit
Admission: RE | Admit: 2013-11-16 | Discharge: 2013-11-16 | Disposition: A | Discharge status: Home / Self Care | Payer: Medicare Other | Source: Ambulatory Visit | Attending: Otolaryngology | Admitting: Otolaryngology

## 2013-11-16 DIAGNOSIS — R430 Anosmia: Secondary | ICD-10-CM

## 2016-06-20 DIAGNOSIS — G4733 Obstructive sleep apnea (adult) (pediatric): Secondary | ICD-10-CM | POA: Insufficient documentation

## 2016-06-20 DIAGNOSIS — R430 Anosmia: Secondary | ICD-10-CM | POA: Insufficient documentation

## 2016-08-23 ENCOUNTER — Other Ambulatory Visit: Payer: Self-pay | Admitting: Internal Medicine

## 2016-08-23 DIAGNOSIS — R42 Dizziness and giddiness: Secondary | ICD-10-CM

## 2016-08-27 ENCOUNTER — Ambulatory Visit
Admission: RE | Admit: 2016-08-27 | Discharge: 2016-08-27 | Disposition: A | Discharge status: Home / Self Care | Payer: Medicare Other | Source: Ambulatory Visit | Attending: Internal Medicine | Admitting: Internal Medicine

## 2016-08-27 DIAGNOSIS — R42 Dizziness and giddiness: Secondary | ICD-10-CM

## 2016-08-27 MED ORDER — IOPAMIDOL (ISOVUE-300) INJECTION 61%
75.0000 mL | Freq: Once | INTRAVENOUS | Status: AC | PRN
  Administered 2016-08-27: 75 mL via INTRAVENOUS

## 2016-08-28 ENCOUNTER — Encounter (INDEPENDENT_AMBULATORY_CARE_PROVIDER_SITE_OTHER): Payer: Self-pay | Admitting: Orthopaedic Surgery

## 2016-08-28 ENCOUNTER — Ambulatory Visit (INDEPENDENT_AMBULATORY_CARE_PROVIDER_SITE_OTHER): Payer: Medicare Other | Admitting: Orthopaedic Surgery

## 2016-08-28 ENCOUNTER — Ambulatory Visit (INDEPENDENT_AMBULATORY_CARE_PROVIDER_SITE_OTHER): Payer: Medicare Other

## 2016-08-28 DIAGNOSIS — M25511 Pain in right shoulder: Secondary | ICD-10-CM

## 2016-08-28 DIAGNOSIS — M25522 Pain in left elbow: Secondary | ICD-10-CM

## 2016-08-28 NOTE — Progress Notes (Signed)
Office Visit Note   Patient: Leon Thomas           Date of Birth: [birth year]           MRN: [id number] Visit Date: 08/28/2016              Requested by: No referring provider defined for this encounter. PCP: Default, Provider, MD   Assessment & Plan: Visit Diagnoses:  1. Acute pain of right shoulder   2. Pain in left elbow     Plan: Patient has right shoulder contusion and possible avulsion fracture of left elbow enthesophyte. He has no focal deficits and I do not appreciate any pathology with the triceps tendon itself for the insertion. Recommend symptomatically treatment. Follow-up with me as needed.  Follow-Up Instructions: Return if symptoms worsen or fail to improve.   Orders:  Orders Placed This Encounter  Procedures  . XR Shoulder Right  . XR Elbow 2 Views Left   No orders of the defined types were placed in this encounter.     Procedures: No procedures performed   Clinical Data: No additional findings.   Subjective: Chief Complaint  Patient presents with  . Right Shoulder - Pain  . Left Elbow - Pain    Leon Thomas is a 61 yo male who presents with a chief complaint of right shoulder pain and left elbow pain. He fell off his porch on 08/03/16, landing on his elbow then shoulder. He describes his elbow pain as very tender to the touch and his shoulder pain he describes as sore. Both are 10/10 constant pain. He has associated swelling of both his elbow and his shoulder, but this has improved. His shoulder pain radiates to his mid-humerus. He has no relief with hydrocodone and minimal relief with pain relief cream. Elbow sleeve helps the swelling. Denies numbness, tingling, and weakness.    Review of Systems Complete review of systems negative except for history of present illness  Objective: Vital Signs: There were no vitals taken for this visit.  Physical Exam  Constitutional: He is oriented to person, place, and time. He appears well-developed and  well-nourished.  HENT:  Head: Normocephalic and atraumatic.  Eyes: Pupils are equal, round, and reactive to light.  Neck: Neck supple.  Pulmonary/Chest: Effort normal.  Abdominal: Soft.  Musculoskeletal: Normal range of motion.  Neurological: He is alert and oriented to person, place, and time.  Skin: Skin is warm.  Psychiatric: He has a normal mood and affect. His behavior is normal. Judgment and thought content normal.  Nursing note and vitals reviewed.   Ortho Exam Exam of the right shoulder shows intact rotator cuff testing. No significant impingement signs. Full range of motion. Exam of the left elbow shows good triceps strength. He does have tenderness palpation over the distal triceps insertion. Elbow joint is stable. Full range of motion of the elbow. Specialty Comments:  No specialty comments available.  Imaging: Ct Head W & Wo Contrast  Result Date: 08/27/2016 CLINICAL DATA:  61 y/o M; head injury on 08/02/2016 with confusion and persistent dizziness. EXAM: CT HEAD WITHOUT AND WITH CONTRAST TECHNIQUE: Contiguous axial images were obtained from the base of the skull through the vertex without and with intravenous contrast CONTRAST:  75mL ISOVUE-300 IOPAMIDOL (ISOVUE-300) INJECTION 61% COMPARISON:  02/13/2007 CT head. FINDINGS: Brain: No evidence of acute infarction, hemorrhage, hydrocephalus, extra-axial collection or mass lesion/mass effect. Right parietal lobe wedge of hypoattenuation involving gray and white matter new from the prior study.  No abnormal enhancement of the brain after administration of intravenous contrast. Vascular: Moderate calcific atherosclerosis of the cavernous and paraclinoid internal carotid arteries. Skull: No displaced calvarial fracture. Sinuses/Orbits: Chronic left lamina papyracea fracture. Moderate diffuse paranasal sinus mucosal thickening and trace right mastoid effusion. Other: None. IMPRESSION: 1. No acute intracranial abnormality identified. No  abnormal enhancement of the brain. 2. 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 3. Moderate paranasal sinus disease and trace right mastoid effusion. Electronically Signed   By: Mitzi HansenLance  Furusawa-Stratton M.D.   On: 08/27/2016 19:45   Xr Elbow 2 Views Left  Result Date: 08/28/2016 Olecranon enthesophyte possible avulsion fracture of the part of the enthesophyte  Xr Shoulder Right  Result Date: 08/28/2016 Acromioclavicular joint arthropathy. No acute findings.    PMFS History: There are no active problems to display for this patient.  Past Medical History:  Diagnosis Date  . Arthritis    knees  . Dysrhythmia    04[facility]21-13 [facility]Dr. Jacinto HalimGanji. No further problems  . Gout   . Hypertension   . Sleep apnea    cpap nightly, settings 3    No family history on file.  Past Surgical History:  Procedure Laterality Date  . CARDIOVERSION N/A 04/21/2013   Procedure: CARDIOVERSION;  Surgeon: Pamella PertJagadeesh R Ganji, MD;  [facility];  Service: Cardiovascular;  Laterality: N/A;  h&p in file- HW  . COLONOSCOPY WITH PROPOFOL N/A 09/04/2013   Procedure: COLONOSCOPY WITH PROPOFOL;  Surgeon: Vertell NovakJames L Edwards Jr., MD;  Location: [facility] ENDOSCOPY;  Service: Endoscopy;  Laterality: N/A;  . FOOT SURGERY Left   . JOINT REPLACEMENT Left   . ORIF ANKLE FRACTURE Right    Social History   Occupational History  . Not on file.   Social History Main Topics  . Smoking status: Never Smoker  . Smokeless tobacco: Never Used  . Alcohol use 0.6 oz/week    1 Cans of beer per week     Comment: daily 1 beer  . Drug use: No  . Sexual activity: Not on file

## 2016-09-14 ENCOUNTER — Telehealth (INDEPENDENT_AMBULATORY_CARE_PROVIDER_SITE_OTHER): Payer: Self-pay | Admitting: Orthopaedic Surgery

## 2016-09-14 NOTE — Telephone Encounter (Signed)
[facility] [phone], CALLBACK # [phone]

## 2016-10-31 ENCOUNTER — Ambulatory Visit (INDEPENDENT_AMBULATORY_CARE_PROVIDER_SITE_OTHER): Payer: Medicare Other

## 2016-10-31 ENCOUNTER — Ambulatory Visit (INDEPENDENT_AMBULATORY_CARE_PROVIDER_SITE_OTHER): Payer: Medicare Other | Admitting: Family

## 2016-10-31 ENCOUNTER — Encounter (INDEPENDENT_AMBULATORY_CARE_PROVIDER_SITE_OTHER): Payer: Self-pay | Admitting: Orthopedic Surgery

## 2016-10-31 VITALS — Ht 74.0 in | Wt 351.0 lb

## 2016-10-31 DIAGNOSIS — M25552 Pain in left hip: Secondary | ICD-10-CM

## 2016-10-31 DIAGNOSIS — M5416 Radiculopathy, lumbar region: Secondary | ICD-10-CM

## 2016-10-31 MED ORDER — PREDNISONE 10 MG (21) PO TBPK: ORAL_TABLET | ORAL | 0 refills | Status: DC

## 2016-11-01 NOTE — Progress Notes (Signed)
Office Visit Note   Patient: Leon Thomas           Date of Birth: Feb 12, 1956           MRN: [id number] Visit Date: 10/31/2016              Requested by: No referring provider defined for this encounter. PCP: Default, Provider, MD  Chief Complaint  Patient presents with  . Left Hip - Pain      HPI: The patient is a 61 year old gentleman who presents today for evaluation of left hip/leg pain. Does endorse some groin pain. Points to low back on left. States the pain radiates down his left thigh. The pain has been ongoing since March 16. Feels heaviness in his lower extremity when trying to raise the thigh from a seated position also feels heavy with ambulation. Denies numbness or tingling.   Assessment & Plan: Visit Diagnoses:  1. Lumbar radiculopathy   2. Pain in left hip     Plan: will have him complete a pred taper. Follow up in 4 weeks.   Follow-Up Instructions: Return in about 4 weeks (around 11/28/2016).   Left Hip Exam   Tenderness  The patient is experiencing no tenderness.     Range of Motion  The patient has normal left hip ROM.  Tests  FABER: negative   Back Exam   Tenderness  The patient is experiencing tenderness in the lumbar.  Range of Motion  The patient has normal back ROM.  Muscle Strength  The patient has normal back strength.  Tests  Straight leg raise right: negative Straight leg raise left: negative      Patient is alert, oriented, no adenopathy, well-dressed, normal affect, normal respiratory effort. Antalgic gait. Using a cane for ambulation.   Imaging: No results found.  Labs: No results found for: HGBA1C, ESRSEDRATE, CRP, LABURIC, REPTSTATUS, GRAMSTAIN, CULT, LABORGA  Orders:  Orders Placed This Encounter  Procedures  . XR HIP UNILAT W OR W/O PELVIS 2-3 VIEWS LEFT  . XR Lumbar Spine 2-3 Views   Meds ordered this encounter  Medications  . predniSONE (STERAPRED UNI-PAK 21 TAB) 10 MG (21) TBPK tablet    Sig: Per  packet instructions    Dispense:  21 tablet    Refill:  0     Procedures: No procedures performed  Clinical Data: No additional findings.  ROS:  All other systems negative, except as noted in the HPI. Review of Systems  Constitutional: Negative for chills and fever.  Cardiovascular: Negative for leg swelling.  Musculoskeletal: Positive for back pain and gait problem. Negative for arthralgias.    Objective: Vital Signs: Ht  (1.88 m)   Wt (!) 351 lb (159.2 kg)   BMI 45.07 kg/m   Specialty Comments:  No specialty comments available.  PMFS History: There are no active problems to display for this patient.  Past Medical History:  Diagnosis Date  . Arthritis    knees  . Dysrhythmia    04-21-13 Cardioverted at [facility] for A. Fib-Dr. Jacinto Halim. No further problems  . Gout   . Hypertension   . Sleep apnea    cpap nightly, settings 3    No family history on file.  Past Surgical History:  Procedure Laterality Date  . CARDIOVERSION N/A 04/21/2013   Procedure: CARDIOVERSION;  Surgeon: [name], MD;  Location[facility] ENDOSCOPY;  Service: Cardiovascular;  Laterality: N/A;  h&p in file- HW  . COLONOSCOPY WITH PROPOFOL N/A 09/04/2013  Procedure: COLONOSCOPY WITH PROPOFOL;  Surgeon: Vertell Novak., MD;  Location: [facility] ENDOSCOPY;  Service: Endoscopy;  Laterality: N/A;  . FOOT SURGERY Left   . JOINT REPLACEMENT Left   . ORIF ANKLE FRACTURE Right    Social History   Occupational History  . Not on file.   Social History Main Topics  . Smoking status: Never Smoker  . Smokeless tobacco: Never Used  . Alcohol use 0.6 oz/week    1 Cans of beer per week     Comment: daily 1 beer  . Drug use: No  . Sexual activity: Not on file

## 2016-11-30 ENCOUNTER — Ambulatory Visit (INDEPENDENT_AMBULATORY_CARE_PROVIDER_SITE_OTHER): Payer: Medicare Other | Admitting: Family

## 2016-11-30 ENCOUNTER — Ambulatory Visit (INDEPENDENT_AMBULATORY_CARE_PROVIDER_SITE_OTHER): Payer: Medicare Other | Admitting: Orthopedic Surgery

## 2016-12-03 ENCOUNTER — Encounter (INDEPENDENT_AMBULATORY_CARE_PROVIDER_SITE_OTHER): Payer: Self-pay | Admitting: Orthopedic Surgery

## 2016-12-03 ENCOUNTER — Ambulatory Visit (INDEPENDENT_AMBULATORY_CARE_PROVIDER_SITE_OTHER): Payer: Medicare Other | Admitting: Orthopedic Surgery

## 2016-12-03 VITALS — Ht 74.0 in | Wt 351.0 lb

## 2016-12-03 DIAGNOSIS — G8929 Other chronic pain: Secondary | ICD-10-CM

## 2016-12-03 DIAGNOSIS — M5442 Lumbago with sciatica, left side: Secondary | ICD-10-CM | POA: Diagnosis not present

## 2016-12-03 NOTE — Progress Notes (Signed)
Office Visit Note   Patient: Leon Thomas           Date of Birth: 1956-04-24           MRN: [id number] Visit Date: 12/03/2016              Requested by: No referring provider defined for this encounter. PCP: Default, Provider, MD  Chief Complaint  Patient presents with  . Lower Back - Pain  . Left Hip - Pain      HPI: The patient is a 61 year old gentleman who presents today for evaluation of left hip and left sided low back pain. Does endorse some groin pain. Points to low back on left. States the pain radiates down his left thigh and around hip to groin. The pain has been ongoing since March 16. Feels heaviness in his lower extremity when trying to raise the thigh from a seated position also feels heavy with ambulation. Denies numbness or tingling.  Has completed a prednisone taper with moderate relief. Continued symptoms.  Assessment & Plan: Visit Diagnoses:  1. Chronic left-sided low back pain with left-sided sciatica     Plan: Will proceed with MRI. States needs open MRI due to claustriphobia. Will follow up post mri.   Follow-Up Instructions: Return for p mri.   Left Hip Exam   Tenderness  The patient is experiencing no tenderness.     Range of Motion  The patient has normal left hip ROM.  Tests  FABER: negative   Back Exam   Tenderness  The patient is experiencing tenderness in the lumbar.  Range of Motion  The patient has normal back ROM.  Muscle Strength  The patient has normal back strength.  Tests  Straight leg raise right: negative Straight leg raise left: negative      Patient is alert, oriented, no adenopathy, well-dressed, normal affect, normal respiratory effort. Antalgic gait. No assistive devices today.   Imaging: No results found.  Labs: No results found for: HGBA1C, ESRSEDRATE, CRP, LABURIC, REPTSTATUS, GRAMSTAIN, CULT, LABORGA  Orders:  Orders Placed This Encounter  Procedures  . MR LUMBAR SPINE WO CONTRAST   No  orders of the defined types were placed in this encounter.    Procedures: No procedures performed  Clinical Data: No additional findings.  ROS:  All other systems negative, except as noted in the HPI. Review of Systems  Constitutional: Negative for chills and fever.  Cardiovascular: Negative for leg swelling.  Musculoskeletal: Positive for back pain. Negative for arthralgias.  Neurological: Negative for weakness and numbness.    Objective: Vital Signs: Ht 6\' 2"  (1.88 m)   Wt (!) 351 lb (159.2 kg)   BMI 45.07 kg/m   Specialty Comments:  No specialty comments available.  PMFS History: There are no active problems to display for this patient.  Past Medical History:  Diagnosis Date  . Arthritis    knees  . Dysrhythmia    04-21-13 [facility]Dr. Jacinto HalimGanji. No further problems  . Gout   . Hypertension   . Sleep apnea    cpap nightly, settings 3    No family history on file.  Past Surgical History:  Procedure Laterality Date  . CARDIOVERSION N/A 04/21/2013   Procedure: CARDIOVERSION;  Surgeon: Pamella PertJagadeesh R Ganji, MD;  [facility];  Service: Cardiovascular;  Laterality: N/A;  h&p in file- HW  . COLONOSCOPY WITH PROPOFOL N/A 09/04/2013   Procedure: COLONOSCOPY WITH PROPOFOL;  Surgeon: Tresea MallJames L Edwards  Montez Hageman., MD;  Location: [facility] ENDOSCOPY;  Service: Endoscopy;  Laterality: N/A;  . FOOT SURGERY Left   . JOINT REPLACEMENT Left   . ORIF ANKLE FRACTURE Right    Social History   Occupational History  . Not on file.   Social History Main Topics  . Smoking status: Never Smoker  . Smokeless tobacco: Never Used  . Alcohol use 0.6 oz/week    1 Cans of beer per week     Comment: daily 1 beer  . Drug use: No  . Sexual activity: Not on file

## 2017-01-03 ENCOUNTER — Telehealth (INDEPENDENT_AMBULATORY_CARE_PROVIDER_SITE_OTHER): Payer: Self-pay

## 2017-01-03 NOTE — Telephone Encounter (Signed)
Talked with patient and scheduled appointment with Dr. Lajoyce Cornersuda for MRI results.

## 2017-01-10 ENCOUNTER — Ambulatory Visit (INDEPENDENT_AMBULATORY_CARE_PROVIDER_SITE_OTHER): Payer: Medicare Other | Admitting: Orthopedic Surgery

## 2017-01-10 DIAGNOSIS — M25552 Pain in left hip: Secondary | ICD-10-CM

## 2017-01-10 DIAGNOSIS — G8929 Other chronic pain: Secondary | ICD-10-CM | POA: Diagnosis not present

## 2017-01-10 DIAGNOSIS — M5442 Lumbago with sciatica, left side: Secondary | ICD-10-CM | POA: Diagnosis not present

## 2017-01-10 NOTE — Progress Notes (Signed)
Office Visit Note   Patient: Leon Thomas           Date of Birth: 1956-07-13           MRN: [id number] Visit Date: 01/10/2017              Requested by: No referring provider defined for this encounter. PCP: Default, Provider, MD  Chief Complaint  Patient presents with  . Results    MRI results      HPI: Patient is a 61 year old gentleman who presents in follow-up for left hip pain and radicular pain from his left lumbar spine down the lateral aspect the left leg and into the groin. Patient states that the pain has gotten much better now after his prednisone dose pack. Of note patient states he is previously on prednisone about 4-5 years. Most recently he was placed on Xarelto for A. fib.  Assessment & Plan: Visit Diagnoses:  1. Chronic left-sided low back pain with left-sided sciatica   2. Pain in left hip     Plan: Recommended continue attempts for weight loss. Use heat on his back do not feel that he needs any further prednisone. Discussed that the symptoms get worse we could consider consultation with Dr. Alvester MorinNewton for epidural steroid injection. Recommended weight loss for his hip arthritis.  Follow-Up Instructions: No Follow-up on file.   Ortho Exam  Patient is alert, oriented, no adenopathy, well-dressed, normal affect, normal respiratory effort. Patient has an abductor lurch on the left consistent with arthritis of the left hip. He has a negative straight leg raise bilaterally. He has internal rotation of the left hip of about 20 external rotation about 20. He has a negative straight leg raise in the left with no focal motor weakness. Review of the MRI scan shows multilevel degenerative disc disease of the lumbar spine including small disc bulge at L2-3 With degenerative bulging disc at L3-4  a broad central protrusion to the left at L4-5 and a posterior lateral protrusion L5-S1.  Imaging: No results found.  Labs: No results found for: HGBA1C, ESRSEDRATE, CRP,  LABURIC, REPTSTATUS, GRAMSTAIN, CULT, LABORGA  Orders:  No orders of the defined types were placed in this encounter.  No orders of the defined types were placed in this encounter.    Procedures: No procedures performed  Clinical Data: No additional findings.  ROS:  All other systems negative, except as noted in the HPI. Review of Systems  Objective: Vital Signs: There were no vitals taken for this visit.  Specialty Comments:  No specialty comments available.  PMFS History: There are no active problems to display for this patient.  Past Medical History:  Diagnosis Date  . Arthritis    knees  . Dysrhythmia    04-21-13 [facility]-Dr. Jacinto HalimGanji. No further problems  . Gout   . Hypertension   . Sleep apnea    cpap nightly, settings 3    No family history on file.  Past Surgical History:  Procedure Laterality Date  . CARDIOVERSION N/A 04/21/2013   Procedure: CARDIOVERSION;  Surgeon: Pamella PertJagadeesh R Ganji, MD;  [facility];  Service: Cardiovascular;  Laterality: N/A;  h&p in file- HW  . COLONOSCOPY WITH PROPOFOL N/A 09/04/2013   Procedure: COLONOSCOPY WITH PROPOFOL;  Surgeon: Vertell NovakJames L Edwards Jr., MD;  Location: [facility] ENDOSCOPY;  Service: Endoscopy;  Laterality: N/A;  . FOOT SURGERY Left   . JOINT REPLACEMENT Left   . ORIF ANKLE FRACTURE Right  Social History   Occupational History  . Not on file.   Social History Main Topics  . Smoking status: Never Smoker  . Smokeless tobacco: Never Used  . Alcohol use 0.6 oz/week    1 Cans of beer per week     Comment: daily 1 beer  . Drug use: No  . Sexual activity: Not on file

## 2017-05-13 ENCOUNTER — Encounter: Payer: Self-pay | Admitting: Family Medicine

## 2017-05-13 ENCOUNTER — Ambulatory Visit (INDEPENDENT_AMBULATORY_CARE_PROVIDER_SITE_OTHER): Payer: Medicare Other | Admitting: Family Medicine

## 2017-05-13 VITALS — BP 151/84 | HR 82 | Temp 98.3°F | Resp 16 | Ht 74.0 in | Wt 370.6 lb

## 2017-05-13 DIAGNOSIS — I4891 Unspecified atrial fibrillation: Secondary | ICD-10-CM

## 2017-05-13 DIAGNOSIS — M109 Gout, unspecified: Secondary | ICD-10-CM | POA: Diagnosis not present

## 2017-05-13 DIAGNOSIS — Z5181 Encounter for therapeutic drug level monitoring: Secondary | ICD-10-CM

## 2017-05-13 DIAGNOSIS — Z125 Encounter for screening for malignant neoplasm of prostate: Secondary | ICD-10-CM

## 2017-05-13 DIAGNOSIS — Z96659 Presence of unspecified artificial knee joint: Secondary | ICD-10-CM

## 2017-05-13 DIAGNOSIS — Z7901 Long term (current) use of anticoagulants: Secondary | ICD-10-CM

## 2017-05-13 DIAGNOSIS — Z23 Encounter for immunization: Secondary | ICD-10-CM | POA: Diagnosis not present

## 2017-05-13 DIAGNOSIS — M1712 Unilateral primary osteoarthritis, left knee: Secondary | ICD-10-CM

## 2017-05-13 DIAGNOSIS — Z79891 Long term (current) use of opiate analgesic: Secondary | ICD-10-CM | POA: Insufficient documentation

## 2017-05-13 NOTE — Patient Instructions (Addendum)
IF you received an x-ray today, you will receive an invoice from [facility]. Please contact [facility] at [phone] with questions or concerns regarding your invoice.   IF you received labwork today, you will receive an invoice from [facility]. Please contact [facility] at [phone] with questions or concerns regarding your invoice.   Our billing staff will not be able to assist you with questions regarding bills from these companies.  You will be contacted with the lab results as soon as they are available. The fastest way to get your results is to activate your My Chart account. Instructions are located on the last page of this paperwork. If you have not heard from [place] regarding the results in 2 weeks, please contact this office.     Cancer Screening for Men A cancer screening is a test or exam that checks for cancer. Your health care provider will recommend specific cancer screenings based on your age, personal history, and family history of cancer. Work with your health care provider to create a cancer screening schedule that protects your health. Why is cancer screening done? Cancer screening is done to look for cancer in the very early stages, before it spreads and becomes harder to treat and before you would start to notice symptoms. Finding cancer early improves the chances of successful treatment. It may save your life. Who should be screened for cancer? All men should be screened for colorectal cancer and skin cancer. Your health care provider may recommend screenings for other types of cancer if:  You had cancer before.  You have a family member with cancer.  You have abnormal genes that could increase the risk of cancer.  You have risk factors for certain cancers, such as smoking.  When you should be screened for cancer depends on:  Your age.  Your medical history and your family's medical history.  Certain lifestyle factors, such as  smoking.  Environmental exposure, such as to asbestos.  What are some common cancer screenings? Lung cancer Lung cancer screening is done with a CT scan that looks for abnormal cells in the lungs. Discuss lung cancer screening with your health care provider if you are 75-29 years old and if any of the following apply to you:  You currently smoke.  You used to smoke heavily.  You have had at least a 30-pack-year smoking history.  You have quit smoking within the past 15 years.  If you smoke heavily or if you used to smoke, you may need to be screened every year. Prostate cancer Prostate cancer screening is done with blood tests and an exam in which a health care provider uses a gloved finger to check prostate size (digital rectal exam). You may need to be screened for prostate cancer if:  You have risk factors of prostate cancer, such as being African American or having a close family member with prostate cancer.  You have inherited gene changes or a genetic condition, including BRCA1 or BRCA2 gene mutations or Lynch syndrome.  You have symptoms of prostate cancer, such as problems urinating or erectile dysfunction.  Prostate cancer screening for men with average risk may start at age 30. Men with risk factors may need to be screened earlier at age 48-45. Once you have been screened for prostate cancer, future screening may be recommended based on the results of your blood tests. Colorectal cancer Screening for colorectal cancer is recommended starting at age 36 for most men. If you have a family  history of colon or rectal cancer or other risk factors, you may need to start having screenings earlier. Talk with your health care provider about which screening test is right for you and how often you should be screened. Colorectal cancer screening looks for cancer or for growths called polyps that often form before cancer starts. Tests to look for cancer or polyps include:  Colonoscopy or  flexible sigmoidoscopy. For these procedures, a flexible tube with a small camera is inserted into the rectum.  CT colonography. This test uses X-rays and a contrast dye to check the colon for polyps. If a polyp is found, you may need to have a colonoscopy so the polyp can be located and removed.  Tests to look for cancer in the stool (feces) include:  Guaiac-based fecal occult blood test (FOBT). This test detects blood in stool. It can be done at home with a kit.  Fecal immunochemical test (FIT). This test detects blood in stool. For this test, you will need to collect stool samples at home.  Stool DNA test. This test looks for blood in stool and any changes in DNA that can lead to colon cancer. For this test, you will need to collect a stool sample at home and send it to a lab.  Skin cancer Skin cancer screening is done by checking the skin for unusual moles or spots and any changes in existing moles. Your health care provider should check your skin for signs of skin cancer at every physical exam. You should check your skin every month and tell your health care provider right away if anything looks unusual. Men with a higher-than-normal risk for skin cancer may want to see a skin specialist (dermatologist) for an annual body check. Where to find more information:  [place]: SkinPromotion.no  Centers for Disease Control and Prevention: http://knight-sullivan.biz/  American Cancer Society: https://www.cancer.org/latest-news/4-cancer-screening-tests-for-men.html Contact a health care provider if:  You have concerns about any signs or symptoms of cancer, such as: ? Moles that have an unusual shape or color. ? Changes in existing moles. ? A sore on your skin that does not heal. ? Blood in your urine or stool. ? Fatigue that does not go away. ? Frequent pain or cramping in your abdomen. ? Coughing or trouble  breathing that does not go away. ? Coughing up blood. ? Losing weight without trying. ? Changes in urination habits. ? Painful urination or ejaculation. Summary  Be aware of and watch for signs and symptoms of cancer, especially symptoms of lung cancer, prostate cancer, colorectal cancer, and skin cancer.  Early detection of cancer with cancer screening may save your life.  Talk with your health care provider about your specific cancer risks.  Work together with your health care provider to create a cancer screening plan that is right for you. This information is not intended to replace advice given to you by your health care provider. Make sure you discuss any questions you have with your health care provider. Document Released: 04/12/2016 Document Revised: 04/12/2016 Document Reviewed: 04/12/2016 Elsevier Interactive Patient Education  [employer].

## 2017-05-13 NOTE — Progress Notes (Signed)
Chief Complaint  Patient presents with  . New Patient (Initial Visit)    establish care, concerns about right knee pain, see [facility], hx arthritis    HPI   A. Fib Pt is seeing Dr. Jacinto Halim, Cardiology, for his hypertension and irregular heart beat and had to have his heart shocked back in rhythm He required cardioversion in 04/21/2013 He denies any palpitations now He ate pork at a wedding over the weekend and it raised his bp BP Readings from Last 3 Encounters:  05/13/17 (!) 151/84  09/04/13 (!) 183/102  04/21/13 140/88   Arthritis He reports that he has been on norco since 2002 for his arthritis He is s/p knee replacement  On the left He reports that he has arthritis throughout his shoulders and his toes as well as his left knee He states that he goes to [facility] in [place]  Morbid obesity Wt Readings from Last 3 Encounters:  05/13/17 (!) 370 lb 9.6 oz (168.1 kg)  12/03/16 (!) 351 lb (159.2 kg)  10/31/16 (!) 351 lb (159.2 kg)   He reports that he exercises at the [facility] but has not been there in over six months He denies any history of diabetes He states that Dr. Elinor Parkinson has checked him and he has a strong history of diabetes in his mother, father, brother. He denies any history of kidney disease  Hypertension  Pt reports that he has been taking his bp meds as instructed except the coreg which he only takes 2 tablets a day INSTEAD of the 1.5 tabs in the AM and 2 tabs in the PM. Cardiovascular ROS: negative for - chest pain, dyspnea on exertion, edema, irregular heartbeat, loss of consciousness, murmur, palpitations, rapid heart rate or shortness of breath He has OSA and is compliant with his cpap   Past Medical History:  Diagnosis Date  . Arthritis    knees  . Dysrhythmia    04-21-13 Cardioverted at [facility] for A. Fib-Dr. Jacinto Halim. No further problems  . Gout   . Hypertension   . Sleep apnea    cpap nightly, settings 3    Current Outpatient  Prescriptions  Medication Sig Dispense Refill  . allopurinol (ZYLOPRIM) 300 MG tablet Take 300 mg by mouth daily.    . cloNIDine (CATAPRES - DOSED IN MG/24 HR) 0.2 mg/24hr patch Place 0.2 mg onto the skin every other day.    Marland Kitchen eplerenone (INSPRA) 25 MG tablet     . furosemide (LASIX) 40 MG tablet Take 40 mg by mouth daily.    Marland Kitchen HYDROcodone-acetaminophen (NORCO) 10-325 MG per tablet Take 1 tablet by mouth every 6 (six) hours as needed for pain. For pain    . mometasone (NASONEX) 50 MCG/ACT nasal spray     . olmesartan (BENICAR) 40 MG tablet Take 40 mg by mouth daily.    Marland Kitchen omega-3 acid ethyl esters (LOVAZA) 1 g capsule Take by mouth daily.    . polyethylene glycol (MIRALAX / GLYCOLAX) packet Take 17 g by mouth daily as needed. For constipation    . Rivaroxaban (XARELTO) 20 MG TABS tablet Take 20 mg by mouth daily with supper.    . carvedilol (COREG) 25 MG tablet TAKE 1 1/2 TABLET BY MOUTH TWO TIMES DAILY  2  . predniSONE (STERAPRED UNI-PAK 21 TAB) 10 MG (21) TBPK tablet Per packet instructions (Patient not taking: Reported on 05/13/2017) 21 tablet 0   No current facility-administered medications for this visit.     Allergies: No  Known Allergies  Past Surgical History:  Procedure Laterality Date  . CARDIOVERSION N/A 04/21/2013   Procedure: CARDIOVERSION;  Surgeon: [name], MD;  Location: [facility] ENDOSCOPY;  Service: Cardiovascular;  Laterality: N/A;  h&p in file- HW  . COLONOSCOPY WITH PROPOFOL N/A 09/04/2013   Procedure: COLONOSCOPY WITH PROPOFOL;  Surgeon: Vertell Novak., MD;  Location: [facility] ENDOSCOPY;  Service: Endoscopy;  Laterality: N/A;  . FOOT SURGERY Left   . JOINT REPLACEMENT Left   . ORIF ANKLE FRACTURE Right     Social History   Social History  . Marital status: Single    Spouse name: N/A  . Number of children: N/A  . Years of education: N/A   Social History Main Topics  . Smoking status: Never Smoker  . Smokeless tobacco: Never Used  . Alcohol use 0.6 oz/week      1 Cans of beer per week     Comment: daily 1 beer  . Drug use: No  . Sexual activity: Not Asked   Other Topics Concern  . None   Social History Narrative  . None    Review of Systems  Constitutional: Negative for chills and fever.  Eyes: Negative for blurred vision and double vision.  Respiratory: Negative for cough, hemoptysis, shortness of breath and wheezing.   Cardiovascular: Negative for chest pain, palpitations and leg swelling.  Gastrointestinal: Negative for abdominal pain, nausea and vomiting.  Genitourinary: Negative for dysuria and urgency.  Skin: Negative for itching and rash.  Neurological: Negative for dizziness and headaches.  Psychiatric/Behavioral: Negative for depression. The patient is not nervous/anxious.     Objective: Vitals:   05/13/17 1113  BP: (!) 151/84  Pulse: 82  Resp: 16  Temp: 98.3 F (36.8 C)  TempSrc: Oral  SpO2: 98%  Weight: (!) 370 lb 9.6 oz (168.1 kg)  Height:  (1.88 m)    Physical Exam  Constitutional: He is oriented to person, place, and time. He appears well-developed and well-nourished.  HENT:  Head: Normocephalic and atraumatic.  Eyes: Conjunctivae and EOM are normal.  Cardiovascular: Normal rate, regular rhythm and normal heart sounds.   No murmur heard. Pulmonary/Chest: Effort normal and breath sounds normal. No respiratory distress. He has no wheezes.  Musculoskeletal:  Left knee with scar and OA changes. Right knee with crepitus and tightness that is restricting range of motion  Neurological: He is alert and oriented to person, place, and time.    Assessment and Plan Bashar was seen today for new patient (initial visit).  Diagnoses and all orders for this visit:  Screening for malignant neoplasm of prostate- discussed screening for prostate cancer in African American Male  -     PSA  Atrial fibrillation, unspecified type [facility])- continue Cardiology recommendation -     CBC -     TSH  Gout, arthritis-  usually affects the feet and hands -     Comprehensive metabolic panel  Primary osteoarthritis of left knee- s/p replacement -     Comprehensive metabolic panel  Morbid obesity (HCC)- will screen for diabetes Pt should return to clinic in one month -     Lipid panel -     Hemoglobin A1c  Chronic anticoagulation- continue xarelto  History of knee joint replacement- follow with Orthopedics  Chronic use of opiate drugs therapeutic purposes- pt to get a drug screen for pain mgmt  Encounter for medication monitoring- will check liver and kidney function  -     Comprehensive metabolic  panel  Other orders -     Cancel: Tdap vaccine greater than or equal to 7yo IM -     Cancel: Flu Vaccine QUAD 36+ mos IM   A total of 45 minutes were spent face-to-face with the patient during this encounter and over half of that time was spent on counseling and coordination of care.   Anikin Prosser A Delanee Xin

## 2017-05-14 LAB — COMPREHENSIVE METABOLIC PANEL
ALBUMIN: 4.4 g/dL (ref 3.5–5.5)
ALK PHOS: 50 IU/L (ref 39–117)
ALT: 12 IU/L (ref 0–44)
AST: 17 IU/L (ref 0–40)
Albumin/Globulin Ratio: 1.5 (ref 1.2–2.2)
BILIRUBIN TOTAL: 1.1 mg/dL (ref 0.0–1.2)
BUN/Creatinine Ratio: 16 (ref 9–20)
BUN: 22 mg/dL (ref 6–24)
CHLORIDE: 104 mmol/L (ref 96–106)
CO2: 26 mmol/L (ref 20–29)
CREATININE: 1.41 mg/dL — AB (ref 0.76–1.27)
Calcium: 9.7 mg/dL (ref 8.7–10.2)
GFR calc Af Amer: 63 mL/min/{1.73_m2} (ref >59)
GFR calc non Af Amer: 54 mL/min/{1.73_m2} — ABNORMAL LOW (ref >59)
GLUCOSE: 99 mg/dL (ref 65–99)
Globulin, Total: 2.9 g/dL (ref 1.5–4.5)
Potassium: 4.4 mmol/L (ref 3.5–5.2)
Sodium: 145 mmol/L — ABNORMAL HIGH (ref 134–144)
Total Protein: 7.3 g/dL (ref 6.0–8.5)

## 2017-05-14 LAB — CBC
Hematocrit: 42.8 % (ref 37.5–51.0)
Hemoglobin: 14 g/dL (ref 13.0–17.7)
MCH: 30.4 pg (ref 26.6–33.0)
MCHC: 32.7 g/dL (ref 31.5–35.7)
MCV: 93 fL (ref 79–97)
PLATELETS: 151 10*3/uL (ref 150–379)
RBC: 4.61 x10E6/uL (ref 4.14–5.80)
RDW: 14.3 % (ref 12.3–15.4)
WBC: 4 10*3/uL (ref 3.4–10.8)

## 2017-05-14 LAB — LIPID PANEL
CHOL/HDL RATIO: 1.8 ratio (ref 0.0–5.0)
Cholesterol, Total: 196 mg/dL (ref 100–199)
HDL: 107 mg/dL (ref >39)
LDL Calculated: 80 mg/dL (ref 0–99)
TRIGLYCERIDES: 46 mg/dL (ref 0–149)
VLDL Cholesterol Cal: 9 mg/dL (ref 5–40)

## 2017-05-14 LAB — PSA: PROSTATE SPECIFIC AG, SERUM: 0.7 ng/mL (ref 0.0–4.0)

## 2017-05-14 LAB — TSH: TSH: 4.27 u[IU]/mL (ref 0.450–4.500)

## 2017-05-14 LAB — HEMOGLOBIN A1C
ESTIMATED AVERAGE GLUCOSE: 103 mg/dL
HEMOGLOBIN A1C: 5.2 % (ref 4.8–5.6)

## 2017-06-10 ENCOUNTER — Ambulatory Visit: Payer: Medicare Other

## 2017-06-10 ENCOUNTER — Ambulatory Visit (INDEPENDENT_AMBULATORY_CARE_PROVIDER_SITE_OTHER): Payer: Medicare Other | Admitting: Family Medicine

## 2017-06-10 ENCOUNTER — Encounter: Payer: Self-pay | Admitting: Family Medicine

## 2017-06-10 ENCOUNTER — Other Ambulatory Visit: Payer: Self-pay

## 2017-06-10 VITALS — BP 122/62 | HR 70 | Temp 97.6°F | Resp 18 | Ht 74.0 in | Wt 371.4 lb

## 2017-06-10 DIAGNOSIS — Z0289 Encounter for other administrative examinations: Secondary | ICD-10-CM

## 2017-06-10 DIAGNOSIS — Z5181 Encounter for therapeutic drug level monitoring: Secondary | ICD-10-CM

## 2017-06-10 DIAGNOSIS — N289 Disorder of kidney and ureter, unspecified: Secondary | ICD-10-CM | POA: Diagnosis not present

## 2017-06-10 DIAGNOSIS — Z79891 Long term (current) use of opiate analgesic: Secondary | ICD-10-CM | POA: Diagnosis not present

## 2017-06-10 MED ORDER — HYDROCODONE-ACETAMINOPHEN 10-325 MG PO TABS: 1.0000 | ORAL_TABLET | ORAL | 0 refills | Status: DC | PRN

## 2017-06-10 NOTE — Patient Instructions (Addendum)
Continue follow up with Cardiology recommendations  You are being referred to Pain Management to handle your future Norco refills  You have been referred to Nutrition to discuss ways to lose weight  Follow up with Orthopedics regarding severity of knee pain  Return to clinic in 3 months for repeat labs    IF you received an x-ray today, you will receive an invoice from [facility]. Please contact [facility] at [phone] with questions or concerns regarding your invoice.   IF you received labwork today, you will receive an invoice from [facility]. Please contact [facility] at [phone] with questions or concerns regarding your invoice.   Our billing staff will not be able to assist you with questions regarding bills from these companies.  You will be contacted with the lab results as soon as they are available. The fastest way to get your results is to activate your My Chart account. Instructions are located on the last page of this paperwork. If you have not heard from us regarding the results in 2 weeks, please contact this office.

## 2017-06-10 NOTE — Progress Notes (Signed)
Chief Complaint  Patient presents with  . Follow-up    4 week f/u gout, afib    HPI  Chronic Pain Medication Pt with soreness of the foot in the ball of the foot He reports that the gout has gotten better He also needs a refill of his pain medication for pain  He states that he is taking 180 Norco 10-325 per month and continues to have pain He states that he has severe pain in his knees and was told he had to lose weight before getting surgery. Wt Readings from Last 3 Encounters:  06/10/17 (!) 371 lb 6.4 oz (168.5 kg)  05/13/17 (!) 370 lb 9.6 oz (168.1 kg)  12/03/16 (!) 351 lb (159.2 kg)   Morbid obesity Pt reports that he struggles with weight loss He only eats once meal a day He states that he does not have much of an appetite Due to his knee and joint pain he cannot exercise For dinner yesterday he had crock pot stew made with chicken  He has chronic renal impairment He states that he talked to his Cardiologist, Dr. Jacinto Halim, who told him to use the lasix only for fluid retention as this was depleting his kidneys He reports that he tries to drink more water now He has not required any lasix     Past Medical History:  Diagnosis Date  . Arthritis    knees  . Dysrhythmia    04-21-13 Cardioverted at [facility] for A. Fib-Dr. Jacinto Halim. No further problems  . Gout   . Hypertension   . Sleep apnea    cpap nightly, settings 3    Current Outpatient Medications  Medication Sig Dispense Refill  . allopurinol (ZYLOPRIM) 300 MG tablet Take 300 mg by mouth daily.    . carvedilol (COREG) 25 MG tablet TAKE 1 1/2 TABLET BY MOUTH TWO TIMES DAILY  2  . cloNIDine (CATAPRES - DOSED IN MG/24 HR) 0.2 mg/24hr patch Place 0.2 mg onto the skin every other day.    . furosemide (LASIX) 40 MG tablet Take 40 mg by mouth daily.    Marland Kitchen HYDROcodone-acetaminophen (NORCO) 10-325 MG tablet Take 1 tablet every 4 (four) hours as needed by mouth for severe pain. For pain 180 tablet 0  . mometasone  (NASONEX) 50 MCG/ACT nasal spray     . olmesartan (BENICAR) 40 MG tablet Take 40 mg by mouth daily.    Marland Kitchen omega-3 acid ethyl esters (LOVAZA) 1 g capsule Take by mouth daily.    . polyethylene glycol (MIRALAX / GLYCOLAX) packet Take 17 g by mouth daily as needed. For constipation    . Rivaroxaban (XARELTO) 20 MG TABS tablet Take 20 mg by mouth daily with supper.    Marland Kitchen eplerenone (INSPRA) 25 MG tablet     . predniSONE (STERAPRED UNI-PAK 21 TAB) 10 MG (21) TBPK tablet Per packet instructions (Patient not taking: Reported on 05/13/2017) 21 tablet 0   No current facility-administered medications for this visit.     Allergies: No Known Allergies  Past Surgical History:  Procedure Laterality Date  . FOOT SURGERY Left   . JOINT REPLACEMENT Left   . ORIF ANKLE FRACTURE Right     Social History   Socioeconomic History  . Marital status: Single    Spouse name: None  . Number of children: None  . Years of education: None  . Highest education level: None  Social Needs  . Financial resource strain: None  . Food insecurity - worry: None  .  Food insecurity - inability: None  . Transportation needs - medical: None  . Transportation needs - non-medical: None  Occupational History  . None  Tobacco Use  . Smoking status: Never Smoker  . Smokeless tobacco: Never Used  Substance and Sexual Activity  . Alcohol use: Yes    Alcohol/week: 0.6 oz    Types: 1 Cans of beer per week    Comment: daily 1 beer  . Drug use: No  . Sexual activity: None  Other Topics Concern  . None  Social History Narrative  . None    Family History  Problem Relation Age of Onset  . Diabetes Mother   . Alzheimer's disease Mother   . Diabetes Father   . Diabetes Brother   . Stroke Sister      ROS Review of Systems See HPI Constitution: No fevers or chills No malaise No diaphoresis Skin: No rash or itching Eyes: no blurry vision, no double vision GU: no dysuria or hematuria Neuro: no dizziness or  headaches * all others reviewed and negative   Objective: Vitals:   06/10/17 1139  BP: 122/62  Pulse: 70  Resp: 18  Temp: 97.6 F (36.4 C)  TempSrc: Oral  SpO2: 94%  Weight: (!) 371 lb 6.4 oz (168.5 kg)  Height: 6\' 2"  (1.88 m)    Physical Exam  Constitutional: He is oriented to person, place, and time. He appears well-developed and well-nourished.  Morbidly obese  HENT:  Head: Normocephalic and atraumatic.  Neck: Normal range of motion. Neck supple. No JVD present.  Cardiovascular: Normal rate, regular rhythm and normal heart sounds.  Pulmonary/Chest: Effort normal and breath sounds normal. No stridor. No respiratory distress.  Musculoskeletal:  Osteoarthritis of the right knee, tenderness to palpation of the great toe on the left  Neurological: He is alert and oriented to person, place, and time.  Psychiatric: He has a normal mood and affect. His behavior is normal. Judgment and thought content normal.    Assessment and Plan Chanetta MarshallJimmy was seen today for follow-up.  Diagnoses and all orders for this visit:  Chronic prescription opiate use -     Drug Screen, Urine -     Ambulatory referral to Pain Clinic  Pain management contract agreement- due to EXCESSIVE amounts of Norco per month and patient concurrent obesity and cardiac issues will refer to Pain Clinic ASAP He may need methadone or suboxone or to be weaned Reviewed NCCSRS was checked and his refills have been consistent and appropriate Ordered drug screen to verify that there is no diversion of meds Pain contract completed and scanned Urine drug screen was appropriate with Opiates present -     Drug Screen, Urine -     Ambulatory referral to Pain Clinic  Encounter for medication monitoring -     Comprehensive metabolic panel -     Ambulatory referral to Pain Clinic  Impaired renal function- will continue to monitor Creatinine improved slightly -     Comprehensive metabolic panel  Morbid obesity (HCC)-  referred pt to Nutrition for both advice on gout and weight loss -     Amb ref to Medical Nutrition Therapy-MNT  Other orders -     Cancel: Tdap vaccine greater than or equal to 7yo IM -     Cancel: Flu Vaccine QUAD 36+ mos IM -     HYDROcodone-acetaminophen (NORCO) 10-325 MG tablet; Take 1 tablet every 4 (four) hours as needed by mouth for severe pain. For pain  A total of 28 minutes were spent face-to-face with the patient during this encounter and over half of that time was spent on counseling and coordination of care.   Ran Tullis A Niza Soderholm

## 2017-06-11 LAB — COMPREHENSIVE METABOLIC PANEL
ALBUMIN: 4.2 g/dL (ref 3.5–5.5)
ALK PHOS: 51 IU/L (ref 39–117)
ALT: 12 IU/L (ref 0–44)
AST: 14 IU/L (ref 0–40)
Albumin/Globulin Ratio: 1.5 (ref 1.2–2.2)
BUN / CREAT RATIO: 12 (ref 9–20)
BUN: 16 mg/dL (ref 6–24)
Bilirubin Total: 0.5 mg/dL (ref 0.0–1.2)
CALCIUM: 9 mg/dL (ref 8.7–10.2)
CHLORIDE: 101 mmol/L (ref 96–106)
CO2: 23 mmol/L (ref 20–29)
Creatinine, Ser: 1.3 mg/dL — ABNORMAL HIGH (ref 0.76–1.27)
GFR calc Af Amer: 69 mL/min/{1.73_m2} (ref >59)
GFR calc non Af Amer: 60 mL/min/{1.73_m2} (ref >59)
GLUCOSE: 88 mg/dL (ref 65–99)
Globulin, Total: 2.8 g/dL (ref 1.5–4.5)
Potassium: 4.7 mmol/L (ref 3.5–5.2)
SODIUM: 141 mmol/L (ref 134–144)
TOTAL PROTEIN: 7 g/dL (ref 6.0–8.5)

## 2017-06-11 LAB — DRUG SCREEN, URINE
AMPHETAMINES, URINE: NEGATIVE ng/mL
BARBITURATE SCREEN URINE: NEGATIVE ng/mL
BENZODIAZEPINE QUANT UR: NEGATIVE ng/mL
Cannabinoid Quant, Ur: NEGATIVE ng/mL
Cocaine (Metab.): NEGATIVE ng/mL
Opiate Quant, Ur: POSITIVE ng/mL — AB
PCP Quant, Ur: NEGATIVE ng/mL

## 2017-06-24 NOTE — Telephone Encounter (Signed)
Error   This encounter was created in error - please disregard. 

## 2017-07-04 ENCOUNTER — Ambulatory Visit: Payer: Medicare Other | Admitting: Registered"

## 2017-09-11 ENCOUNTER — Ambulatory Visit: Payer: Medicare Other | Admitting: Family Medicine

## 2017-09-11 ENCOUNTER — Ambulatory Visit: Payer: Self-pay

## 2017-09-12 ENCOUNTER — Ambulatory Visit: Payer: Medicare Other | Admitting: Family Medicine

## 2017-09-12 ENCOUNTER — Other Ambulatory Visit: Payer: Self-pay

## 2017-09-12 ENCOUNTER — Encounter: Payer: Self-pay | Admitting: Family Medicine

## 2017-09-12 ENCOUNTER — Ambulatory Visit (INDEPENDENT_AMBULATORY_CARE_PROVIDER_SITE_OTHER): Payer: Medicare Other

## 2017-09-12 ENCOUNTER — Telehealth: Payer: Self-pay | Admitting: *Deleted

## 2017-09-12 VITALS — BP 146/82 | HR 82 | Ht 74.0 in | Wt 375.5 lb

## 2017-09-12 VITALS — BP 119/78 | HR 60 | Temp 98.3°F | Resp 16 | Ht 74.02 in | Wt 375.4 lb

## 2017-09-12 DIAGNOSIS — N289 Disorder of kidney and ureter, unspecified: Secondary | ICD-10-CM | POA: Diagnosis not present

## 2017-09-12 DIAGNOSIS — Z0000 Encounter for general adult medical examination without abnormal findings: Secondary | ICD-10-CM

## 2017-09-12 DIAGNOSIS — G894 Chronic pain syndrome: Secondary | ICD-10-CM

## 2017-09-12 DIAGNOSIS — Z79891 Long term (current) use of opiate analgesic: Secondary | ICD-10-CM

## 2017-09-12 DIAGNOSIS — Z5948 Other specified lack of adequate food: Secondary | ICD-10-CM

## 2017-09-12 DIAGNOSIS — Z5181 Encounter for therapeutic drug level monitoring: Secondary | ICD-10-CM

## 2017-09-12 DIAGNOSIS — Z594 Lack of adequate food and safe drinking water: Secondary | ICD-10-CM

## 2017-09-12 DIAGNOSIS — T730XXA Starvation, initial encounter: Secondary | ICD-10-CM

## 2017-09-12 DIAGNOSIS — N182 Chronic kidney disease, stage 2 (mild): Secondary | ICD-10-CM

## 2017-09-12 MED ORDER — HYDROCODONE-ACETAMINOPHEN 10-325 MG PO TABS: 1.0000 | ORAL_TABLET | ORAL | 0 refills | Status: DC | PRN

## 2017-09-12 MED ORDER — FUROSEMIDE 40 MG PO TABS: 40.0000 mg | ORAL_TABLET | Freq: Every day | ORAL | 0 refills | Status: DC

## 2017-09-12 MED ORDER — ALLOPURINOL 300 MG PO TABS: 300.0000 mg | ORAL_TABLET | Freq: Every day | ORAL | 1 refills | Status: DC

## 2017-09-12 NOTE — Progress Notes (Signed)
Subjective:   Leon Thomas is a 62 y.o. male who presents for an Initial Medicare Annual Wellness Visit.  Review of Systems  N/A  Cardiac Risk Factors include: advanced age (>2455men, 86>65 women);male gender;obesity (BMI >30kg/m2);sedentary lifestyle    Objective:    Today's Vitals   09/12/17 0953 09/12/17 1001  BP: (!) 146/82   Pulse: 82   SpO2: 94%   Weight: (!) 375 lb 8 oz (170.3 kg)   Height: 6\' 2"  (1.88 m)   PainSc:  9    Body mass index is 48.21 kg/m.  Advanced Directives 09/12/2017 09/04/2013 04/21/2013  Does Patient Have a Medical Advance Directive? No Patient does not have advance directive Patient does not have advance directive  Would patient like information on creating a medical advance directive? No - Patient declined - -    Current Medications (verified) Outpatient Encounter Medications as of 09/12/2017  Medication Sig  . allopurinol (ZYLOPRIM) 300 MG tablet Take 300 mg by mouth daily.  . carvedilol (COREG) 25 MG tablet TAKE 1 1/2 TABLET BY MOUTH TWO TIMES DAILY  . cloNIDine (CATAPRES - DOSED IN MG/24 HR) 0.2 mg/24hr patch Place 0.2 mg onto the skin every other day.  Marland Kitchen. eplerenone (INSPRA) 25 MG tablet   . furosemide (LASIX) 40 MG tablet Take 40 mg by mouth daily.  Marland Kitchen. HYDROcodone-acetaminophen (NORCO) 10-325 MG tablet Take 1 tablet every 4 (four) hours as needed by mouth for severe pain. For pain  . mometasone (NASONEX) 50 MCG/ACT nasal spray   . olmesartan (BENICAR) 40 MG tablet Take 40 mg by mouth daily.  Marland Kitchen. omega-3 acid ethyl esters (LOVAZA) 1 g capsule Take by mouth daily.  . polyethylene glycol (MIRALAX / GLYCOLAX) packet Take 17 g by mouth daily as needed. For constipation  . Rivaroxaban (XARELTO) 20 MG TABS tablet Take 20 mg by mouth daily with supper.  . [DISCONTINUED] predniSONE (STERAPRED UNI-PAK 21 TAB) 10 MG (21) TBPK tablet Per packet instructions (Patient not taking: Reported on 05/13/2017)   No facility-administered encounter medications on file as of  09/12/2017.     Allergies (verified) Patient has no known allergies.   History: Past Medical History:  Diagnosis Date  . Arthritis    knees  . Dysrhythmia    04-21-13 [facility] A. Fib-Dr. Jacinto HalimGanji. No further problems  . Gout   . Hypertension   . Sleep apnea    cpap nightly, settings 3   Past Surgical History:  Procedure Laterality Date  . CARDIOVERSION N/A 04/21/2013   Procedure: CARDIOVERSION;  Surgeon: Pamella PertJagadeesh R Ganji, MD;  [facility];  Service: Cardiovascular;  Laterality: N/A;  h&p in file- HW  . COLONOSCOPY WITH PROPOFOL N/A 09/04/2013   Procedure: COLONOSCOPY WITH PROPOFOL;  Surgeon: Vertell NovakJames L Edwards Jr., MD;  Location: [facility] ENDOSCOPY;  Service: Endoscopy;  Laterality: N/A;  . FOOT SURGERY Left   . JOINT REPLACEMENT Left   . ORIF ANKLE FRACTURE Right    Family History  Problem Relation Age of Onset  . Diabetes Mother   . Alzheimer's disease Mother   . Diabetes Father   . Diabetes Brother   . Stroke Sister    Social History   Socioeconomic History  . Marital status: Single    Spouse name: None  . Number of children: None  . Years of education: None  . Highest education level: 11th grade  Social Needs  . Financial resource strain: Very hard  . Food insecurity - worry: Often true  .  Food insecurity - inability: Often true  . Transportation needs - medical: No  . Transportation needs - non-medical: No  Occupational History  . None  Tobacco Use  . Smoking status: Never Smoker  . Smokeless tobacco: Never Used  Substance and Sexual Activity  . Alcohol use: Yes    Alcohol/week: 0.6 oz    Types: 1 Cans of beer per week    Comment: daily 1 beer  . Drug use: No  . Sexual activity: None  Other Topics Concern  . None  Social History Narrative  . None   Tobacco Counseling Counseling given: Not Answered   Clinical Intake:  Pre-visit preparation completed: Yes  Pain : 0-10 Pain Score: 9  Pain Type: Chronic pain Pain  Location: Knee Pain Orientation: Right Pain Descriptors / Indicators: Aching Pain Onset: More than a month ago Pain Frequency: Constant     Nutritional Status: BMI > 30  Obese Nutritional Risks: None Diabetes: No  How often do you need to have someone help you when you read instructions, pamphlets, or other written materials from your doctor or pharmacy?: 1 - Never What is the last grade level you completed in school?: 11th grade   Interpreter Needed?: No  Information entered by :: Janalyn Shy, LPN  Activities of Daily Living In your present state of health, do you have any difficulty performing the following activities: 09/12/2017  Hearing? N  Vision? N  Difficulty concentrating or making decisions? N  Walking or climbing stairs? Y  Comment Patient has issues climbing stairs due to knee pain   Dressing or bathing? Y  Doing errands, shopping? N  Preparing Food and eating ? N  Using the Toilet? N  In the past six months, have you accidently leaked urine? N  Do you have problems with loss of bowel control? N  Managing your Medications? N  Managing your Finances? N  Housekeeping or managing your Housekeeping? N  Some recent data might be hidden     Immunizations and Health Maintenance Immunization History  Administered Date(s) Administered  . Pneumococcal Polysaccharide-23 05/13/2017   There are no preventive care reminders to display for this patient.  Patient Care Team: Doristine Bosworth, MD as PCP - General (Internal Medicine) Yates Decamp, MD as Consulting Physician (Cardiology)  Indicate any recent Medical Services you may have received from other than [facility] providers in the past year (date may be approximate).    Assessment:   This is a routine wellness examination for Leon Thomas.  Hearing/Vision screen Hearing Screening Comments: Patient had a hearing exam completed about 2-3 years ago Vision Screening Comments: Patient states that he sees his eye doctor at  least one a year for his routine eye exams.   Dietary issues and exercise activities discussed: Current Exercise Habits: The patient does not participate in regular exercise at present, Exercise limited by: None identified  Goals    . Exercise 3x per week (30 min per time)     Patient wants to try to start back exercising on a consistent basis.       Depression Screen PHQ 2/9 Scores 09/12/2017 06/10/2017 05/13/2017  PHQ - 2 Score 1 0 0    Fall Risk Fall Risk  09/12/2017 06/10/2017 05/13/2017  Falls in the past year? Yes No Yes  Number falls in past yr: 2 or more - 1  Injury with Fall? No - Yes  Comment - - right shoulder-see [facility]  Risk for fall due to : Impaired balance/gait - -  Follow up Falls prevention discussed - -    Is the patient's home free of loose throw rugs in walkways, pet beds, electrical cords, etc?   yes      Grab bars in the bathroom? no      Handrails on the stairs?   yes      Adequate lighting?   yes  Timed Get Up and Go performed: yes, completed within 30 seconds   Cognitive Function:     6CIT Screen 09/12/2017  What Year? 0 points  What month? 0 points  What time? 0 points  Count back from 20 0 points  Months in reverse 2 points  Repeat phrase 4 points  Total Score 6    Screening Tests Health Maintenance  Topic Date Due  . INFLUENZA VACCINE  05/15/2018 (Originally 02/27/2017)  . TETANUS/TDAP  09/12/2018 (Originally 10/12/1976)  . Hepatitis C Screening  09/12/2018 (Originally 07/13/1956)  . HIV Screening  09/12/2018 (Originally 10/12/1972)  . COLONOSCOPY  09/05/2023    Qualifies for Shingles Vaccine? Patient declined shingles vaccine   Cancer Screenings: Lung: Low Dose CT Chest recommended if Age 35-80 years, 30 pack-year currently smoking OR have quit w/in 15years. Patient does not qualify. Colorectal: colonoscopy completed 09/04/2013  Additional Screenings:  Hepatitis B/HIV/Syphillis:Patient declined HIV screening, Hep B and  Syphillis not indicated  Hepatitis C Screening: Patient declined screening     Patient declined flu and tetanus vaccines  Plan:   I have personally reviewed and noted the following in the patient's chart:   . Medical and social history . Use of alcohol, tobacco or illicit drugs  . Current medications and supplements . Functional ability and status . Nutritional status . Physical activity . Advanced directives . List of other physicians . Hospitalizations, surgeries, and ER visits in previous 12 months . Vitals . Screenings to include cognitive, depression, and falls . Referrals and appointments  In addition, I have reviewed and discussed with patient certain preventive protocols, quality metrics, and best practice recommendations. A written personalized care plan for preventive services as well as general preventive health recommendations were provided to patient.   1. Lack of adequate food - Ambulatory referral to C3 Care Team  2. Encounter for Medicare annual wellness exam    Janalyn Shy, LPN   9/60/4540

## 2017-09-12 NOTE — Progress Notes (Signed)
Chief Complaint  Patient presents with  . Follow up on medications    Furosemide, Hydrocodone refills    HPI   Chronic opiate use Pt reports that he fell due to his knee giving out He has chronic knee pain His last use of his Norco 10-325 was 07/26/17 He admits that he has been "eating ibuprofen" to help with his pain in the right knee Orthopedics wants him to lose weight before any surgery Wt Readings from Last 3 Encounters:  09/12/17 (!) 375 lb 7.1 oz (170.3 kg)  09/12/17 (!) 375 lb 8 oz (170.3 kg)  06/10/17 (!) 371 lb 6.4 oz (168.5 kg)    Morbid obesity He eats lot of beans and puts bacon or fat back to season it [facility] so bad that he cannot exercise He states that he tries to eat salads and vegetables He would like to get down to 350 He states that he gets what he can for dinner and tries to eat before 7:30pm  Chronic renal impairment. He is not exercising and is not adherent to low salt diet.  Blood pressure is not well controlled at home. Cardiac symptoms none. Patient denies chest pain, fatigue, irregular heart beat, lower extremity edema and palpitations.  Cardiovascular risk factors: advanced age (older than 15 for men, 72 for women), diabetes mellitus, hypertension, male gender and obesity (BMI >= 30 kg/m2). Use of agents associated with hypertension: none. History of target organ damage: none. BP Readings from Last 3 Encounters:  09/12/17 119/78  09/12/17 (!) 146/82  06/10/17 122/62    Past Medical History:  Diagnosis Date  . Arthritis    knees  . Dysrhythmia    04-21-13 Cardioverted at [facility] for Leon. Fib-Dr. Jacinto Halim. No further problems  . Gout   . Hypertension   . Sleep apnea    cpap nightly, settings 3    Current Outpatient Medications  Medication Sig Dispense Refill  . allopurinol (ZYLOPRIM) 300 MG tablet Take 1 tablet (300 mg total) by mouth daily. 90 tablet 1  . carvedilol (COREG) 25 MG tablet TAKE 1 1/2 TABLET BY MOUTH  TWO TIMES DAILY  2  . cloNIDine (CATAPRES - DOSED IN MG/24 HR) 0.2 mg/24hr patch Place 0.2 mg onto the skin every other day.    Marland Kitchen eplerenone (INSPRA) 25 MG tablet     . furosemide (LASIX) 40 MG tablet Take 1 tablet (40 mg total) by mouth daily. 90 tablet 0  . HYDROcodone-acetaminophen (NORCO) 10-325 MG tablet Take 1 tablet by mouth every 4 (four) hours as needed for severe pain. For pain 180 tablet 0  . olmesartan (BENICAR) 40 MG tablet Take 40 mg by mouth daily.    Marland Kitchen omega-3 acid ethyl esters (LOVAZA) 1 g capsule Take by mouth daily.    . polyethylene glycol (MIRALAX / GLYCOLAX) packet Take 17 g by mouth daily as needed. For constipation    . Rivaroxaban (XARELTO) 20 MG TABS tablet Take 20 mg by mouth daily with supper.    . mometasone (NASONEX) 50 MCG/ACT nasal spray      No current facility-administered medications for this visit.     Allergies: No Known Allergies  Past Surgical History:  Procedure Laterality Date  . CARDIOVERSION N/Leon 04/21/2013   Procedure: CARDIOVERSION;  Surgeon: [name], MD;  Location: [facility] ENDOSCOPY;  Service: Cardiovascular;  Laterality: N/Leon;  h&p in file- HW  . COLONOSCOPY WITH PROPOFOL N/Leon 09/04/2013   Procedure: COLONOSCOPY  WITH PROPOFOL;  Surgeon: Vertell Novak., MD;  Location: [facility] ENDOSCOPY;  Service: Endoscopy;  Laterality: N/Leon;  . FOOT SURGERY Left   . JOINT REPLACEMENT Left   . ORIF ANKLE FRACTURE Right     Social History   Socioeconomic History  . Marital status: Single    Spouse name: None  . Number of children: None  . Years of education: None  . Highest education level: 11th grade  Social Needs  . Financial resource strain: Very hard  . Food insecurity - worry: Often true  . Food insecurity - inability: Often true  . Transportation needs - medical: No  . Transportation needs - non-medical: No  Occupational History  . None  Tobacco Use  . Smoking status: Never Smoker  . Smokeless tobacco: Never Used  Substance and Sexual  Activity  . Alcohol use: Yes    Alcohol/week: 0.6 oz    Types: 1 Cans of beer per week    Comment: daily 1 beer  . Drug use: No  . Sexual activity: None  Other Topics Concern  . None  Social History Narrative  . None    Family History  Problem Relation Age of Onset  . Diabetes Mother   . Alzheimer's disease Mother   . Diabetes Father   . Diabetes Brother   . Stroke Sister      ROS Review of Systems See HPI Constitution: No fevers or chills No malaise No diaphoresis Skin: No rash or itching Eyes: no blurry vision, no double vision GU: no dysuria or hematuria Neuro: no dizziness or headaches all others reviewed and negative   Objective: Vitals:   09/12/17 1108  BP: 119/78  Pulse: 60  Resp: 16  Temp: 98.3 F (36.8 C)  SpO2: 93%  Weight: (!) 375 lb 7.1 oz (170.3 kg)  Height: 6' 2.02" (1.88 m)    Physical Exam  Constitutional: He is oriented to person, place, and time. He appears well-developed and well-nourished.  HENT:  Head: Atraumatic.  Cardiovascular: Normal rate, regular rhythm and normal heart sounds.  No murmur heard. Pulmonary/Chest: Effort normal and breath sounds normal. No stridor. No respiratory distress.  Neurological: He is alert and oriented to person, place, and time.  Skin: Skin is warm. Capillary refill takes less than 2 seconds.  Psychiatric: He has Leon normal mood and affect. His behavior is normal. Judgment and thought content normal.       Assessment and Plan Leon Thomas was seen today for follow up on medications.  Diagnoses and all orders for this visit:  Encounter for medication monitoring -     Basic metabolic panel  Chronic prescription opiate use -     HYDROcodone-acetaminophen (NORCO) 10-325 MG tablet; Take 1 tablet by mouth every 4 (four) hours as needed for severe pain. For pain Review of [place] showed that pt is consistent with his prescription He was dispense 180 tabs which is Leon 30 days supply 06/19/2017 He stretches  out his prescription to last him about 6 weeks  Chronic pain syndrome -     HYDROcodone-acetaminophen (NORCO) 10-325 MG tablet; Take 1 tablet by mouth every 4 (four) hours as needed for severe pain. For pain Patient needs weight loss to have knee surgery Pt cannot lose weight without proper diet and exercise  Chronic renal impairment, stage 2 (mild)-  Sent medical record release to Dr. Jacinto Halim Would like to know the exact diagnoses for the furosemide  Wondering if patient has heart failure -  furosemide (LASIX) 40 MG tablet; Take 1 tablet (40 mg total) by mouth daily.  Impaired renal function- will repeat labs to check creatinine today  Morbid obesity (HCC) -  Discussed referral to bariatric program Awaiting cardiology notes to see if pt has contraindication Also not sure if his current insurance will cover cost  Other orders -     allopurinol (ZYLOPRIM) 300 MG tablet; Take 1 tablet (300 mg total) by mouth daily.       Leon Thomas Leon Thomas

## 2017-09-12 NOTE — Telephone Encounter (Signed)
Entered in error

## 2017-09-12 NOTE — Patient Instructions (Addendum)
[name] , Thank you for taking time to come for your Medicare Wellness Visit. I appreciate your ongoing commitment to your health goals. Please review the following plan we discussed and let me know if I can assist you in the future.   Screening recommendations/referrals: Colonoscopy: up to date, next due 09/05/2023 Recommended yearly ophthalmology/optometry visit for glaucoma screening and checkup Recommended yearly dental visit for hygiene and checkup  Vaccinations: Influenza vaccine: declined  Pneumococcal vaccine: up to date Tdap vaccine: declined due to insurance Shingles vaccine: declined     Advanced directives: Advance directive discussed with you today. Even though you declined this today please call our office should you change your mind and we can give you the proper paperwork for you to fill out.   Conditions/risks identified:  Try to start back exercising on a consistent basis.    Next appointment: today @ 11 am with Dr. Creta Thomas, Next AWV in 1 year   Preventive Care 40-64 Years, Male Preventive care refers to lifestyle choices and visits with your health care provider that can promote health and wellness. What does preventive care include?  A yearly physical exam. This is also called an annual well check.  Dental exams once or twice a year.  Routine eye exams. Ask your health care provider how often you should have your eyes checked.  Personal lifestyle choices, including:  Daily care of your teeth and gums.  Regular physical activity.  Eating a healthy diet.  Avoiding tobacco and drug use.  Limiting alcohol use.  Practicing safe sex.  Taking low-dose aspirin every day starting at age 60. What happens during an annual well check? The services and screenings done by your health care provider during your annual well check will depend on your age, overall health, lifestyle risk factors, and family history of disease. Counseling  Your health care provider  may ask you questions about your:  Alcohol use.  Tobacco use.  Drug use.  Emotional well-being.  Home and relationship well-being.  Sexual activity.  Eating habits.  Work and work Astronomerenvironment. Screening  You may have the following tests or measurements:  Height, weight, and BMI.  Blood pressure.  Lipid and cholesterol levels. These may be checked every 5 years, or more frequently if you are over 60 years old.  Skin check.  Lung cancer screening. You may have this screening every year starting at age 60 if you have a 30-pack-year history of smoking and currently smoke or have quit within the past 15 years.  Fecal occult blood test (FOBT) of the stool. You may have this test every year starting at age 60.  Flexible sigmoidoscopy or colonoscopy. You may have a sigmoidoscopy every 5 years or a colonoscopy every 10 years starting at age 60.  Prostate cancer screening. Recommendations will vary depending on your family history and other risks.  Hepatitis C blood test.  Hepatitis B blood test.  Sexually transmitted disease (STD) testing.  Diabetes screening. This is done by checking your blood sugar (glucose) after you have not eaten for a while (fasting). You may have this done every 1-3 years. Discuss your test results, treatment options, and if necessary, the need for more tests with your health care provider. Vaccines  Your health care provider may recommend certain vaccines, such as:  Influenza vaccine. This is recommended every year.  Tetanus, diphtheria, and acellular pertussis (Tdap, Td) vaccine. You may need a Td booster every 10 years.  Zoster vaccine. You may need this after  age 36.  Pneumococcal 13-valent conjugate (PCV13) vaccine. You may need this if you have certain conditions and have not been vaccinated.  Pneumococcal polysaccharide (PPSV23) vaccine. You may need one or two doses if you smoke cigarettes or if you have certain conditions. Talk to your  health care provider about which screenings and vaccines you need and how often you need them. This information is not intended to replace advice given to you by your health care provider. Make sure you discuss any questions you have with your health care provider. Document Released: 08/12/2015 Document Revised: 04/04/2016 Document Reviewed: 05/17/2015 Elsevier Interactive Patient Education  2017 [employer].  Fall Prevention in the Home Falls can cause injuries. They can happen to people of all ages. There are many things you can do to make your home safe and to help prevent falls. What can I do on the outside of my home?  Regularly fix the edges of walkways and driveways and fix any cracks.  Remove anything that might make you trip as you walk through a door, such as a raised step or threshold.  Trim any bushes or trees on the path to your home.  Use bright outdoor lighting.  Clear any walking paths of anything that might make someone trip, such as rocks or tools.  Regularly check to see if handrails are loose or broken. Make sure that both sides of any steps have handrails.  Any raised decks and porches should have guardrails on the edges.  Have any leaves, snow, or ice cleared regularly.  Use sand or salt on walking paths during winter.  Clean up any spills in your garage right away. This includes oil or grease spills. What can I do in the bathroom?  Use night lights.  Install grab bars by the toilet and in the tub and shower. Do not use towel bars as grab bars.  Use non-skid mats or decals in the tub or shower.  If you need to sit down in the shower, use a plastic, non-slip stool.  Keep the floor dry. Clean up any water that spills on the floor as soon as it happens.  Remove soap buildup in the tub or shower regularly.  Attach bath mats securely with double-sided non-slip rug tape.  Do not have throw rugs and other things on the floor that can make you trip. What  can I do in the bedroom?  Use night lights.  Make sure that you have a light by your bed that is easy to reach.  Do not use any sheets or blankets that are too big for your bed. They should not hang down onto the floor.  Have a firm chair that has side arms. You can use this for support while you get dressed.  Do not have throw rugs and other things on the floor that can make you trip. What can I do in the kitchen?  Clean up any spills right away.  Avoid walking on wet floors.  Keep items that you use a lot in easy-to-reach places.  If you need to reach something above you, use a strong step stool that has a grab bar.  Keep electrical cords out of the way.  Do not use floor polish or wax that makes floors slippery. If you must use wax, use non-skid floor wax.  Do not have throw rugs and other things on the floor that can make you trip. What can I do with my stairs?  Do not leave any  items on the stairs.  Make sure that there are handrails on both sides of the stairs and use them. Fix handrails that are broken or loose. Make sure that handrails are as long as the stairways.  Check any carpeting to make sure that it is firmly attached to the stairs. Fix any carpet that is loose or worn.  Avoid having throw rugs at the top or bottom of the stairs. If you do have throw rugs, attach them to the floor with carpet tape.  Make sure that you have a light switch at the top of the stairs and the bottom of the stairs. If you do not have them, ask someone to add them for you. What else can I do to help prevent falls?  Wear shoes that:  Do not have high heels.  Have rubber bottoms.  Are comfortable and fit you well.  Are closed at the toe. Do not wear sandals.  If you use a stepladder:  Make sure that it is fully opened. Do not climb a closed stepladder.  Make sure that both sides of the stepladder are locked into place.  Ask someone to hold it for you, if  possible.  Clearly mark and make sure that you can see:  Any grab bars or handrails.  First and last steps.  Where the edge of each step is.  Use tools that help you move around (mobility aids) if they are needed. These include:  Canes.  Walkers.  Scooters.  Crutches.  Turn on the lights when you go into a dark area. Replace any light bulbs as soon as they burn out.  Set up your furniture so you have a clear path. Avoid moving your furniture around.  If any of your floors are uneven, fix them.  If there are any pets around you, be aware of where they are.  Review your medicines with your doctor. Some medicines can make you feel dizzy. This can increase your chance of falling. Ask your doctor what other things that you can do to help prevent falls. This information is not intended to replace advice given to you by your health care provider. Make sure you discuss any questions you have with your health care provider. Document Released: 05/12/2009 Document Revised: 12/22/2015 Document Reviewed: 08/20/2014 Elsevier Interactive Patient Education  2017 [employer].

## 2017-09-12 NOTE — Telephone Encounter (Signed)
Faxed signed authorization medical release form to Dr Jacinto [facility]anji at [facility] for last 2 office notes, per Dr Creta LevinStallings.

## 2017-09-13 LAB — BASIC METABOLIC PANEL
BUN/Creatinine Ratio: 16 (ref 9–20)
BUN: 16 mg/dL (ref 6–24)
CO2: 23 mmol/L (ref 20–29)
CREATININE: 1.02 mg/dL (ref 0.76–1.27)
Calcium: 9.7 mg/dL (ref 8.7–10.2)
Chloride: 101 mmol/L (ref 96–106)
GFR calc Af Amer: 93 mL/min/{1.73_m2} (ref >59)
GFR, EST NON AFRICAN AMERICAN: 80 mL/min/{1.73_m2} (ref >59)
GLUCOSE: 98 mg/dL (ref 65–99)
Potassium: 4.1 mmol/L (ref 3.5–5.2)
Sodium: 143 mmol/L (ref 134–144)

## 2017-09-16 ENCOUNTER — Telehealth: Payer: Self-pay

## 2017-09-16 NOTE — Telephone Encounter (Signed)
Called pt to discuss community resource referral received from nurse health advisor. Spoke with pt who agreed for me to mail him food resources and he also confirmed his mailing address.    Sherle PoeNicole Dayvian Blixt, B.A.  Care Guide - [facility] Care at [facility] [phone]

## 2017-09-17 ENCOUNTER — Encounter: Payer: Self-pay | Admitting: Family Medicine

## 2017-12-03 ENCOUNTER — Other Ambulatory Visit: Payer: Self-pay | Admitting: Family Medicine

## 2017-12-03 DIAGNOSIS — N182 Chronic kidney disease, stage 2 (mild): Secondary | ICD-10-CM

## 2017-12-10 NOTE — Progress Notes (Signed)
Chief Complaint  Patient presents with  . medication monitoring    3 month f/u    HPI   Chronic Prescription Opiate Use and Chronic Right Knee Pain He was dispensed 180 tabs on 09/12/17 which lasts 90 days because he stretches the medications to make them last His Orthopedic Surgeon advised Bariatric Surgery first He has to lose weight before he can get knee surgery His pain is caused by chronic right knee pain.  He is s/p left knee replacement in 2014.  He states that his left knee is much better. This has limited his ability to exercise due to his right knee pain.  His opiate use helps his pain He denies constipation due to taking miralax, denies sleep disturbance, denies hallucination There is a history of OSA and is compliant with CPAP He is not taking sedative hypnotic or benzos He consumes alcohol 8-9 beers per week He knows that if he loses his medications he has to report them stoles and is aware of his pain mgmt policy   Chronic Gout He states that the beers does not flare his gout His gout trigger is shellfish He states that his last gout attack was in March 2019 after visiting [place] and eating steak He normally gets affected in his fingers and his right foot   Hypertension: Patient here for follow-up of elevated blood pressure. He is not exercising and is not adherent to low salt diet.  Blood pressure is well controlled at home. Cardiac symptoms none. Patient denies chest pain, chest pressure/discomfort, claudication, dyspnea, irregular heart beat, lower extremity edema, near-syncope, orthopnea and palpitations.  Cardiovascular risk factors: dyslipidemia, hypertension, obesity (BMI >= 30 kg/m2) and sedentary lifestyle.   BP Readings from Last 3 Encounters:  12/12/17 (!) 155/89  09/12/17 119/78  09/12/17 (!) 146/82   Lab Results  Component Value Date   CREATININE 1.02 09/12/2017   Morbid Obesity Wt Readings from Last 3 Encounters:  12/12/17 (!) 383 lb 12.8 oz  (174.1 kg)  09/12/17 (!) 375 lb 7.1 oz (170.3 kg)  09/12/17 (!) 375 lb 8 oz (170.3 kg)   Pt reports that he does not eat that much food but has been gaining weight. He states that his exercise is limited.  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 He reports that he eats lots of greens which he enjoys.   Past Medical History:  Diagnosis Date  . Arthritis    knees  . Dysrhythmia    04-21-13 Cardioverted at [facility] for A. Fib-Dr. Jacinto Halim. No further problems  . Gout   . Hypertension   . Sleep apnea    cpap nightly, settings 3    Current Outpatient Medications  Medication Sig Dispense Refill  . allopurinol (ZYLOPRIM) 300 MG tablet Take 1 tablet (300 mg total) by mouth daily. 90 tablet 1  . carvedilol (COREG) 25 MG tablet TAKE 1 1/2 TABLET BY MOUTH TWO TIMES DAILY  2  . cloNIDine (CATAPRES - DOSED IN MG/24 HR) 0.2 mg/24hr patch Place 0.2 mg onto the skin every other day.    Marland Kitchen eplerenone (INSPRA) 25 MG tablet Take 1 tablet (25 mg total) by mouth daily. 90 tablet 1  . furosemide (LASIX) 40 MG tablet TAKE 1 TABLET BY MOUTH EVERY DAY 90 tablet 0  . HYDROcodone-acetaminophen (NORCO) 10-325 MG tablet Take 1 tablet by mouth every 4 (four) hours as needed for severe pain. For pain 180 tablet 0  .  olmesartan (BENICAR) 40 MG tablet Take 40 mg by mouth daily.    Marland Kitchen omega-3 acid ethyl esters (LOVAZA) 1 g capsule Take by mouth daily.    . polyethylene glycol (MIRALAX / GLYCOLAX) packet Take 17 g by mouth daily as needed. For constipation    . Rivaroxaban (XARELTO) 20 MG TABS tablet Take 20 mg by mouth daily with supper.    . cloNIDine (CATAPRES - DOSED IN MG/24 HR) 0.2 mg/24hr patch Place 1 patch (0.2 mg total) onto the skin once a week. 4 patch 3  . mometasone (NASONEX) 50 MCG/ACT nasal spray      No current facility-administered medications for this visit.     Allergies: No Known Allergies  Past Surgical History:  Procedure Laterality Date  .  CARDIOVERSION N/A 04/21/2013   Procedure: CARDIOVERSION;  Surgeon: [name], MD;  Location: [facility] ENDOSCOPY;  Service: Cardiovascular;  Laterality: N/A;  h&p in file- HW  . COLONOSCOPY WITH PROPOFOL N/A 09/04/2013   Procedure: COLONOSCOPY WITH PROPOFOL;  Surgeon: Vertell Novak., MD;  Location: [facility] ENDOSCOPY;  Service: Endoscopy;  Laterality: N/A;  . FOOT SURGERY Left   . JOINT REPLACEMENT Left   . ORIF ANKLE FRACTURE Right     Social History   Socioeconomic History  . Marital status: Single    Spouse name: Not on file  . Number of children: Not on file  . Years of education: Not on file  . Highest education level: 11th grade  Occupational History  . Not on file  Social Needs  . Financial resource strain: Very hard  . Food insecurity:    Worry: Often true    Inability: Often true  . Transportation needs:    Medical: No    Non-medical: No  Tobacco Use  . Smoking status: Never Smoker  . Smokeless tobacco: Never Used  Substance and Sexual Activity  . Alcohol use: Yes    Alcohol/week: 0.6 oz    Types: 1 Cans of beer per week    Comment: daily 1 beer  . Drug use: No  . Sexual activity: Not on file  Lifestyle  . Physical activity:    Days per week: 0 days    Minutes per session: 0 min  . Stress: Very much  Relationships  . Social connections:    Talks on phone: More than three times a week    Gets together: More than three times a week    Attends religious service: Never    Active member of club or organization: No    Attends meetings of clubs or organizations: Never    Relationship status: Never married  Other Topics Concern  . Not on file  Social History Narrative  . Not on file    Family History  Problem Relation Age of Onset  . Diabetes Mother   . Alzheimer's disease Mother   . Diabetes Father   . Diabetes Brother   . Stroke Sister      ROS Review of Systems See HPI Constitution: No fevers or chills No malaise No diaphoresis Skin: No rash or  itching Eyes: no blurry vision, no double vision GU: no dysuria or hematuria Neuro: no dizziness or headaches all others reviewed and negative   Objective: Vitals:   12/12/17 0807  BP: (!) 155/89  Pulse: 73  Resp: 17  Temp: 98.5 F (36.9 C)  TempSrc: Oral  SpO2: 96%  Weight: (!) 383 lb 12.8 oz (174.1 kg)  Height: 6' 2.02" (1.88 m)  Physical Exam  Constitutional: He is oriented to person, place, and time. He appears well-developed and well-nourished.  Morbidly obese  HENT:  Head: Normocephalic and atraumatic.  Eyes: Conjunctivae and EOM are normal.  Cardiovascular: Normal rate, regular rhythm and normal heart sounds.  No murmur heard. Pulmonary/Chest: Effort normal and breath sounds normal. No stridor. No respiratory distress. He has no wheezes. He has no rales. He exhibits no tenderness.  Musculoskeletal: Normal range of motion.  Neurological: He is alert and oriented to person, place, and time.  Skin: Skin is warm. Capillary refill takes less than 2 seconds.  Psychiatric: He has a normal mood and affect. His behavior is normal. Judgment and thought content normal.    Assessment and Plan  Problem List Items Addressed This Visit      Respiratory   OSA (obstructive sleep apnea)    Discussed compliance to CPAP and discussed that if surgery is necessary he will need Cardiac Clearance.        Musculoskeletal and Integument   Gout, arthritis    Discussed compliance to dietary modifications Compliance with allopurinol Will check renal function monitoring      Relevant Medications   allopurinol (ZYLOPRIM) 300 MG tablet   HYDROcodone-acetaminophen (NORCO) 10-325 MG tablet   Primary osteoarthritis of right knee    Referral placed for Orthopedics - Dr. Ernest Pine  Second opinion requested by patient Knee replacement would be advised but emphasized that he would need preop clearance for surgery      Relevant Medications   allopurinol (ZYLOPRIM) 300 MG tablet    HYDROcodone-acetaminophen (NORCO) 10-325 MG tablet   Other Relevant Orders   Ambulatory referral to Orthopedic Surgery     Other   Morbid obesity (HCC)    His morbid obesity is worsening. Discussed that because of his heart condition dietary supplements would not be an options. Bariatric Surgery was advised and continues to be suggested.      Chronic prescription opiate use   Relevant Medications   HYDROcodone-acetaminophen (NORCO) 10-325 MG tablet   Chronic pain syndrome    Refilled pain medications. PMP Aware is appropriate.        Relevant Medications   HYDROcodone-acetaminophen (NORCO) 10-325 MG tablet    Other Visit Diagnoses    Chronic pain of right knee    -  Primary   Relevant Orders   Ambulatory referral to Orthopedic Surgery   Encounter for medication monitoring       Relevant Orders   Comprehensive metabolic panel       Lashone Stauber A Creta Levin

## 2017-12-12 ENCOUNTER — Other Ambulatory Visit: Payer: Self-pay

## 2017-12-12 ENCOUNTER — Ambulatory Visit (INDEPENDENT_AMBULATORY_CARE_PROVIDER_SITE_OTHER): Payer: Medicare Other | Admitting: Family Medicine

## 2017-12-12 ENCOUNTER — Encounter: Payer: Self-pay | Admitting: Family Medicine

## 2017-12-12 VITALS — BP 155/89 | HR 73 | Temp 98.5°F | Resp 17 | Ht 74.02 in | Wt 383.8 lb

## 2017-12-12 DIAGNOSIS — G8929 Other chronic pain: Secondary | ICD-10-CM | POA: Diagnosis not present

## 2017-12-12 DIAGNOSIS — M109 Gout, unspecified: Secondary | ICD-10-CM

## 2017-12-12 DIAGNOSIS — G4733 Obstructive sleep apnea (adult) (pediatric): Secondary | ICD-10-CM | POA: Diagnosis not present

## 2017-12-12 DIAGNOSIS — Z5181 Encounter for therapeutic drug level monitoring: Secondary | ICD-10-CM | POA: Diagnosis not present

## 2017-12-12 DIAGNOSIS — M1711 Unilateral primary osteoarthritis, right knee: Secondary | ICD-10-CM | POA: Diagnosis not present

## 2017-12-12 DIAGNOSIS — Z79891 Long term (current) use of opiate analgesic: Secondary | ICD-10-CM

## 2017-12-12 DIAGNOSIS — M25561 Pain in right knee: Secondary | ICD-10-CM

## 2017-12-12 DIAGNOSIS — G894 Chronic pain syndrome: Secondary | ICD-10-CM | POA: Diagnosis not present

## 2017-12-12 LAB — COMPREHENSIVE METABOLIC PANEL WITH GFR
ALT: 9 [IU]/L (ref 0–44)
AST: 15 [IU]/L (ref 0–40)
Albumin/Globulin Ratio: 1.4 (ref 1.2–2.2)
Albumin: 3.9 g/dL (ref 3.6–4.8)
Alkaline Phosphatase: 46 [IU]/L (ref 39–117)
BUN/Creatinine Ratio: 11 (ref 10–24)
BUN: 13 mg/dL (ref 8–27)
Bilirubin Total: 0.5 mg/dL (ref 0.0–1.2)
CO2: 22 mmol/L (ref 20–29)
Calcium: 8.8 mg/dL (ref 8.6–10.2)
Chloride: 107 mmol/L — ABNORMAL HIGH (ref 96–106)
Creatinine, Ser: 1.17 mg/dL (ref 0.76–1.27)
GFR calc Af Amer: 78 mL/min/{1.73_m2}
GFR calc non Af Amer: 67 mL/min/{1.73_m2}
Globulin, Total: 2.7 g/dL (ref 1.5–4.5)
Glucose: 109 mg/dL — ABNORMAL HIGH (ref 65–99)
Potassium: 4.5 mmol/L (ref 3.5–5.2)
Sodium: 143 mmol/L (ref 134–144)
Total Protein: 6.6 g/dL (ref 6.0–8.5)

## 2017-12-12 MED ORDER — ALLOPURINOL 300 MG PO TABS: 300.0000 mg | ORAL_TABLET | Freq: Every day | ORAL | 1 refills | Status: DC

## 2017-12-12 MED ORDER — HYDROCODONE-ACETAMINOPHEN 10-325 MG PO TABS: 1.0000 | ORAL_TABLET | ORAL | 0 refills | Status: DC | PRN

## 2017-12-12 MED ORDER — CLONIDINE 0.2 MG/24HR TD PTWK: 0.2000 mg | MEDICATED_PATCH | TRANSDERMAL | 3 refills | Status: DC

## 2017-12-12 MED ORDER — EPLERENONE 25 MG PO TABS: 25.0000 mg | ORAL_TABLET | Freq: Every day | ORAL | 1 refills | Status: DC

## 2017-12-12 NOTE — Assessment & Plan Note (Signed)
Discussed compliance to dietary modifications Compliance with allopurinol Will check renal function monitoring

## 2017-12-12 NOTE — Assessment & Plan Note (Signed)
His morbid obesity is worsening. Discussed that because of his heart condition dietary supplements would not be an options. Bariatric Surgery was advised and continues to be suggested.

## 2017-12-12 NOTE — Patient Instructions (Signed)
     IF you received an x-ray today, you will receive an invoice from [facility]. Please contact [facility] at [phone] with questions or concerns regarding your invoice.   IF you received labwork today, you will receive an invoice from [facility]. Please contact [facility] at [phone] with questions or concerns regarding your invoice.   Our billing staff will not be able to assist you with questions regarding bills from these companies.  You will be contacted with the lab results as soon as they are available. The fastest way to get your results is to activate your My Chart account. Instructions are located on the last page of this paperwork. If you have not heard from us regarding the results in 2 weeks, please contact this office.     

## 2017-12-12 NOTE — Assessment & Plan Note (Signed)
Refilled pain medications. PMP Aware is appropriate.

## 2017-12-12 NOTE — Assessment & Plan Note (Signed)
Referral placed for Orthopedics - Dr. Ernest Pine  Second opinion requested by patient Knee replacement would be advised but emphasized that he would need preop clearance for surgery

## 2017-12-12 NOTE — Assessment & Plan Note (Signed)
Discussed compliance to CPAP and discussed that if surgery is necessary he will need Cardiac Clearance.

## 2018-01-07 ENCOUNTER — Encounter (HOSPITAL_COMMUNITY): Payer: Self-pay | Admitting: Family Medicine

## 2018-01-07 ENCOUNTER — Observation Stay (HOSPITAL_COMMUNITY)
Admission: EM | Admit: 2018-01-07 | Discharge: 2018-01-09 | Disposition: A | Discharge status: Home / Self Care | Payer: Medicare Other | Attending: Internal Medicine | Admitting: Internal Medicine

## 2018-01-07 DIAGNOSIS — G4733 Obstructive sleep apnea (adult) (pediatric): Secondary | ICD-10-CM | POA: Diagnosis present

## 2018-01-07 DIAGNOSIS — K269 Duodenal ulcer, unspecified as acute or chronic, without hemorrhage or perforation: Secondary | ICD-10-CM | POA: Insufficient documentation

## 2018-01-07 DIAGNOSIS — Z6841 Body Mass Index (BMI) 40.0 and over, adult: Secondary | ICD-10-CM | POA: Diagnosis not present

## 2018-01-07 DIAGNOSIS — B9681 Helicobacter pylori [H. pylori] as the cause of diseases classified elsewhere: Secondary | ICD-10-CM | POA: Diagnosis not present

## 2018-01-07 DIAGNOSIS — I480 Paroxysmal atrial fibrillation: Secondary | ICD-10-CM | POA: Insufficient documentation

## 2018-01-07 DIAGNOSIS — K2950 Unspecified chronic gastritis without bleeding: Secondary | ICD-10-CM | POA: Diagnosis not present

## 2018-01-07 DIAGNOSIS — Z86010 Personal history of colonic polyps: Secondary | ICD-10-CM | POA: Insufficient documentation

## 2018-01-07 DIAGNOSIS — Z7901 Long term (current) use of anticoagulants: Secondary | ICD-10-CM

## 2018-01-07 DIAGNOSIS — M170 Bilateral primary osteoarthritis of knee: Secondary | ICD-10-CM | POA: Diagnosis not present

## 2018-01-07 DIAGNOSIS — Z9989 Dependence on other enabling machines and devices: Secondary | ICD-10-CM | POA: Diagnosis not present

## 2018-01-07 DIAGNOSIS — I10 Essential (primary) hypertension: Secondary | ICD-10-CM | POA: Diagnosis not present

## 2018-01-07 DIAGNOSIS — K921 Melena: Principal | ICD-10-CM | POA: Insufficient documentation

## 2018-01-07 DIAGNOSIS — Z79899 Other long term (current) drug therapy: Secondary | ICD-10-CM | POA: Diagnosis not present

## 2018-01-07 DIAGNOSIS — G894 Chronic pain syndrome: Secondary | ICD-10-CM | POA: Insufficient documentation

## 2018-01-07 DIAGNOSIS — I4891 Unspecified atrial fibrillation: Secondary | ICD-10-CM | POA: Diagnosis not present

## 2018-01-07 DIAGNOSIS — M109 Gout, unspecified: Secondary | ICD-10-CM | POA: Diagnosis present

## 2018-01-07 DIAGNOSIS — R001 Bradycardia, unspecified: Secondary | ICD-10-CM | POA: Insufficient documentation

## 2018-01-07 DIAGNOSIS — K922 Gastrointestinal hemorrhage, unspecified: Secondary | ICD-10-CM | POA: Diagnosis present

## 2018-01-07 DIAGNOSIS — Z79891 Long term (current) use of opiate analgesic: Secondary | ICD-10-CM | POA: Insufficient documentation

## 2018-01-07 DIAGNOSIS — M1711 Unilateral primary osteoarthritis, right knee: Secondary | ICD-10-CM | POA: Diagnosis present

## 2018-01-07 LAB — CBC
HEMATOCRIT: 39.8 % (ref 39.0–52.0)
HEMOGLOBIN: 13.6 g/dL (ref 13.0–17.0)
MCH: 30.9 pg (ref 26.0–34.0)
MCHC: 34.2 g/dL (ref 30.0–36.0)
MCV: 90.5 fL (ref 78.0–100.0)
Platelets: 153 10*3/uL (ref 150–400)
RBC: 4.4 MIL/uL (ref 4.22–5.81)
RDW: 13.4 % (ref 11.5–15.5)
WBC: 5 10*3/uL (ref 4.0–10.5)

## 2018-01-07 LAB — COMPREHENSIVE METABOLIC PANEL
ALT: 13 U/L — ABNORMAL LOW (ref 17–63)
AST: 16 U/L (ref 15–41)
Albumin: 4 g/dL (ref 3.5–5.0)
Alkaline Phosphatase: 47 U/L (ref 38–126)
Anion gap: 7 (ref 5–15)
BILIRUBIN TOTAL: 1 mg/dL (ref 0.3–1.2)
BUN: 19 mg/dL (ref 6–20)
CO2: 28 mmol/L (ref 22–32)
Calcium: 9.3 mg/dL (ref 8.9–10.3)
Chloride: 103 mmol/L (ref 101–111)
Creatinine, Ser: 1.1 mg/dL (ref 0.61–1.24)
Glucose, Bld: 99 mg/dL (ref 65–99)
POTASSIUM: 4 mmol/L (ref 3.5–5.1)
Sodium: 138 mmol/L (ref 135–145)
TOTAL PROTEIN: 7.7 g/dL (ref 6.5–8.1)

## 2018-01-07 LAB — TYPE AND SCREEN
ABO/RH(D): B POS
ANTIBODY SCREEN: NEGATIVE

## 2018-01-07 LAB — ABO/RH: ABO/RH(D): B POS

## 2018-01-07 LAB — POC OCCULT BLOOD, ED: FECAL OCCULT BLD: POSITIVE — AB

## 2018-01-07 MED ORDER — SODIUM CHLORIDE 0.9 % IV SOLN
8.0000 mg/h | INTRAVENOUS | Status: DC
  Administered 2018-01-07: 8 mg/h via INTRAVENOUS
  Filled 2018-01-07 (×3): qty 80

## 2018-01-07 MED ORDER — SODIUM CHLORIDE 0.9 % IV SOLN
80.0000 mg | Freq: Once | INTRAVENOUS | Status: AC
  Administered 2018-01-07: 80 mg via INTRAVENOUS
  Filled 2018-01-07: qty 80

## 2018-01-07 NOTE — ED Notes (Signed)
Pt states BP needs to be done in right arm

## 2018-01-07 NOTE — ED Triage Notes (Signed)
Patient reports he started experiencing black, tarry stools since Sunday morning. Patient reports he is experiencing intermittent abd pain with no nausea, vomiting or diarrhea. Patient reports last Friday, he did have a episode of being dizzy and blurred vision for about 15 seconds while driving.

## 2018-01-07 NOTE — H&P (Signed)
History and Physical    Leon Thomas:[id number] DOB: 1956-05-08 DOA: 01/07/2018  PCP: Doristine Bosworth, MD   Patient coming from: Home  Chief Complaint: Black tarry stools  HPI: Leon Thomas is a 62 y.o. male with medical history significant for OSA, atrial fibrillation on anticoagulation, chronic pain right knee, HTN, gout, obesity. Patient presented with complaints of black tarry stools started 3 days ago Sunday. Patient reports intermittent abdominal pain mostly LUQ, over the past 3 weeks, described as a "gas pain". Reports almost daueikly NSAID use , 1-2 tablets over the past several weeks for chronic right knee pain which he says needs replacement.  He denies vomiting.  Patient is on Xarelto for atrial fibrillation and compliant.   ED Course: Blood pressure systolic 130s to 811, heart rates 50s to 60s, hemoglobin 13.6, platelets 153, unremarkable BMP creatinine.  EKG showed sinus rhythm.  [facility] was consulted in the ED, EDP awaiting recommendations.  Protonix GTT started in ED. hospitalist was called to admit for GI bleed.  Review of Systems: As per HPI otherwise 10 point review of systems negative.   Past Medical History:  Diagnosis Date  . Arthritis    knees  . Dysrhythmia    04-21-13 Cardioverted at [facility] for A. Fib-Dr. Jacinto Halim. No further problems  . Gout   . Hypertension   . Sleep apnea    cpap nightly, settings 3    Past Surgical History:  Procedure Laterality Date  . CARDIOVERSION N/A 04/21/2013   Procedure: CARDIOVERSION;  Surgeon: [name], MD;  Location: [facility] ENDOSCOPY;  Service: Cardiovascular;  Laterality: N/A;  h&p in file- HW  . COLONOSCOPY WITH PROPOFOL N/A 09/04/2013   Procedure: COLONOSCOPY WITH PROPOFOL;  Surgeon: Vertell Novak., MD;  Location: [facility] ENDOSCOPY;  Service: Endoscopy;  Laterality: N/A;  . FOOT SURGERY Left   . JOINT REPLACEMENT Left   . ORIF ANKLE FRACTURE Right      reports that he has never smoked. He has never used  smokeless tobacco. He reports that he drinks about 0.6 oz of alcohol per week. He reports that he does not use drugs.  No Known Allergies  Family History  Problem Relation Age of Onset  . Diabetes Mother   . Alzheimer's disease Mother   . Diabetes Father   . Diabetes Brother   . Stroke Sister     Prior to Admission medications   Medication Sig Start Date End Date Taking? Authorizing Provider  allopurinol (ZYLOPRIM) 300 MG tablet Take 1 tablet (300 mg total) by mouth daily. 12/12/17  Yes Collie Siad A, MD  carvedilol (COREG) 25 MG tablet TAKE 2 TABLETS BY MOUTH DAILY 03/19/17  Yes [provider]  cloNIDine (CATAPRES - DOSED IN MG/24 HR) 0.2 mg/24hr patch Place 1 patch (0.2 mg total) onto the skin once a week. Patient taking differently: Place 0.2 mg onto the skin as directed. As needed weekly for blood pressure 12/12/17  Yes Stallings, Zoe A, MD  eplerenone (INSPRA) 25 MG tablet Take 1 tablet (25 mg total) by mouth daily. 12/12/17  Yes Stallings, Zoe A, MD  furosemide (LASIX) 40 MG tablet TAKE 1 TABLET BY MOUTH EVERY DAY 12/03/17  Yes Stallings, Zoe A, MD  HYDROcodone-acetaminophen (NORCO) 10-325 MG tablet Take 1 tablet by mouth every 4 (four) hours as needed for severe pain. For pain 12/12/17  Yes Stallings, Zoe A, MD  olmesartan (BENICAR) 40 MG tablet Take 40 mg by mouth daily.   Yes  [provider]  omega-3 acid ethyl esters (LOVAZA) 1 g capsule Take 1 g by mouth daily.    Yes [provider]  Rivaroxaban (XARELTO) 20 MG TABS tablet Take 20 mg by mouth daily.    Yes [provider]  polyethylene glycol (MIRALAX / GLYCOLAX) packet Take 17 g by mouth daily as needed. For constipation    [provider]    Physical Exam: Vitals:   01/07/18 1734 01/07/18 1800 01/07/18 1830 01/07/18 1843  BP: 139/80 130/85 (!) 154/96 (!) 154/96  Pulse: (!) 55 (!) 59 72 72  Resp: 15   15  Temp:      TempSrc:      SpO2: 98% 99% 97% 97%  Weight:      Height:         Constitutional: NAD, calm, comfortable Vitals:   01/07/18 1734 01/07/18 1800 01/07/18 1830 01/07/18 1843  BP: 139/80 130/85 (!) 154/96 (!) 154/96  Pulse: (!) 55 (!) 59 72 72  Resp: 15   15  Temp:      TempSrc:      SpO2: 98% 99% 97% 97%  Weight:      Height:       Eyes: PERRL, lids and conjunctivae normal ENMT: Mucous membranes are moist. Posterior pharynx clear of any exudate or lesions.Normal dentition.  Neck: normal, supple, no masses, no thyromegaly Respiratory: clear to auscultation bilaterally, no wheezing, no crackles. Normal respiratory effort. No accessory muscle use.  Cardiovascular: Regular rate and rhythm, no murmurs / rubs / gallops. No extremity edema. 2+ pedal pulses. No carotid bruits.  Abdomen: obese, no tenderness, no masses palpated. No hepatosplenomegaly. Bowel sounds positive.  Musculoskeletal: no clubbing / cyanosis. No joint deformity upper and lower extremities. Good ROM, no contractures. Normal muscle tone.  Skin: no rashes, lesions, ulcers. No induration Neurologic: CN 2-12 grossly intact. Strength 5/5 in all 4.  Psychiatric: Normal judgment and insight. Alert and oriented x 3. Normal mood.   Labs on Admission: I have personally reviewed following labs and imaging studies  CBC: Recent Labs  Lab 01/07/18 1431  WBC 5.0  HGB 13.6  HCT 39.8  MCV 90.5  PLT 153   Basic Metabolic Panel: Recent Labs  Lab 01/07/18 1431  NA 138  K 4.0  CL 103  CO2 28  GLUCOSE 99  BUN 19  CREATININE 1.10  CALCIUM 9.3   GFR: Estimated Creatinine Clearance: 119.1 mL/min (by C-G formula based on SCr of 1.1 mg/dL). Liver Function Tests: Recent Labs  Lab 01/07/18 1431  AST 16  ALT 13*  ALKPHOS 47  BILITOT 1.0  PROT 7.7  ALBUMIN 4.0   Urine analysis:    Component Value Date/Time   COLORURINE YELLOW 02/09/2007 0341   APPEARANCEUR CLEAR 02/09/2007 0341   LABSPEC 1.019 02/09/2007 0341   PHURINE 5.5 02/09/2007 0341   GLUCOSEU NEGATIVE 02/09/2007 0341    HGBUR NEGATIVE 02/09/2007 0341   BILIRUBINUR NEGATIVE 02/09/2007 0341   KETONESUR NEGATIVE 02/09/2007 0341   PROTEINUR 30 (A) 02/09/2007 0341   UROBILINOGEN 0.2 02/09/2007 0341   NITRITE NEGATIVE 02/09/2007 0341   LEUKOCYTESUR NEGATIVE 02/09/2007 0341    Radiological Exams on Admission: No results found.  EKG: Independently reviewed.  Sinus rhythm.  T wave flattening V5 V6, new compared to last EKG 2014.   Assessment/Plan Active Problems:   GI bleed   GI bleed-likely upper.  Abdominal pain, black tarry stools.  NSAID use history. Hemoglobin-13.6 > 12, baseline ~14.  Colonoscopy 2015-minimal diverticulosis,  repeat recommended in 5 years. -Cont Protonix GTT -Will need to reconsult GI a.m. -CBC q. 12 hourly x 2 -NPO -Hold anticoag with xalrelto  HTN_-blood pressure reassuring, Systolic 130s to 161W170s.  -Continue home clonidine 0.2, patient has a patch, has been compliant, to avoid rebound hypertension. -Hold home Lasix eplerenone and carvedilol, in the setting of GI bleed  Atrial fibrillation-rate controlled.  Heart rates 50s to 60s. -Hold Coreg for now -Hold anticoagulation with Xarelto  OSA - CPAP  HIV as part of routine health screening  DVT prophylaxis: Scds Code Status: Full Family Communication: None at bedside Disposition Plan: Per rounding team Consults called: GI re-consult a.m. Admission status: Obs , tele   Onnie BoerEjiroghene E Zaryah Seckel MD [facility] Pager [phone] From 6PM-2AM.  Otherwise please contact night-coverage [email or web address] Password [email or web address]  01/07/2018, 9:27 PM

## 2018-01-07 NOTE — ED Provider Notes (Addendum)
[name]-EMERGENCY DEPT Provider Note   CSN: [id number] Arrival date & time: 01/07/18  1238     History   Chief Complaint Chief Complaint  Patient presents with  . Melena  . Abdominal Pain    HPI Leon HalonJimmy R Steffey is a 62 y.o. male.  HPI Patient states he has had black tarry stools for the past 2 days.  He has had episodic abdominal pain though currently denies any pain.  Patient is on Xarelto for atrial fibrillation.  He has taken no recent iron supplement or Pepto-Bismol.  No fever or chills.  Patient had one episode of dizziness described as spinning sensation 5 days ago.  Currently denies any dizziness. Past Medical History:  Diagnosis Date  . Arthritis    knees  . Dysrhythmia    04-21-13 [facility]-Dr. Jacinto HalimGanji. No further problems  . Gout   . Hypertension   . Sleep apnea    cpap nightly, settings 3    Patient Active Problem List   Diagnosis Date Noted  . Primary osteoarthritis of right knee 12/12/2017  . Chronic prescription opiate use 12/12/2017  . Chronic pain syndrome 12/12/2017  . Atrial fibrillation (HCC) 05/13/2017  . Gout, arthritis 05/13/2017  . Osteoarthritis of left knee 05/13/2017  . Morbid obesity (HCC) 05/13/2017  . Chronic anticoagulation 05/13/2017  . History of knee joint replacement 05/13/2017  . Chronic use of opiate drugs therapeutic purposes 05/13/2017  . Anosmia 06/20/2016  . OSA (obstructive sleep apnea) 06/20/2016    Past Surgical History:  Procedure Laterality Date  . CARDIOVERSION N/A 04/21/2013   Procedure: CARDIOVERSION;  Surgeon: Pamella PertJagadeesh R Ganji, MD;  [facility] ENDOSCOPY;  Service: Cardiovascular;  Laterality: N/A;  h&p in file- HW  . COLONOSCOPY WITH PROPOFOL N/A 09/04/2013   Procedure: COLONOSCOPY WITH PROPOFOL;  Surgeon: Vertell NovakJames L Edwards Jr., MD;  Location: [facility] ENDOSCOPY;  Service: Endoscopy;  Laterality: N/A;  . FOOT SURGERY Left   . JOINT REPLACEMENT Left   . ORIF ANKLE  FRACTURE Right         Home Medications    Prior to Admission medications   Medication Sig Start Date End Date Taking? Authorizing Provider  allopurinol (ZYLOPRIM) 300 MG tablet Take 1 tablet (300 mg total) by mouth daily. 12/12/17  Yes Collie SiadStallings, Zoe A, MD  carvedilol (COREG) 25 MG tablet TAKE 2 TABLETS BY MOUTH DAILY 03/19/17  Yes [provider]  cloNIDine (CATAPRES - DOSED IN MG/24 HR) 0.2 mg/24hr patch Place 1 patch (0.2 mg total) onto the skin once a week. Patient taking differently: Place 0.2 mg onto the skin as directed. As needed weekly for blood pressure 12/12/17  Yes Stallings, Zoe A, MD  eplerenone (INSPRA) 25 MG tablet Take 1 tablet (25 mg total) by mouth daily. 12/12/17  Yes Stallings, Zoe A, MD  furosemide (LASIX) 40 MG tablet TAKE 1 TABLET BY MOUTH EVERY DAY 12/03/17  Yes Stallings, Zoe A, MD  HYDROcodone-acetaminophen (NORCO) 10-325 MG tablet Take 1 tablet by mouth every 4 (four) hours as needed for severe pain. For pain 12/12/17  Yes Stallings, Zoe A, MD  olmesartan (BENICAR) 40 MG tablet Take 40 mg by mouth daily.   Yes [provider]  omega-3 acid ethyl esters (LOVAZA) 1 g capsule Take 1 g by mouth daily.    Yes [provider]  Rivaroxaban (XARELTO) 20 MG TABS tablet Take 20 mg by mouth daily.    Yes [provider]  polyethylene glycol (  MIRALAX / GLYCOLAX) packet Take 17 g by mouth daily as needed. For constipation    [provider]    Family History Family History  Problem Relation Age of Onset  . Diabetes Mother   . Alzheimer's disease Mother   . Diabetes Father   . Diabetes Brother   . Stroke Sister     Social History Social History   Tobacco Use  . Smoking status: Never Smoker  . Smokeless tobacco: Never Used  Substance Use Topics  . Alcohol use: Yes    Alcohol/week: 0.6 oz    Types: 1 Cans of beer per week    Comment: Daily about 4-5 12oz beers   . Drug use: No     Allergies   Patient has no known  allergies.   Review of Systems Review of Systems  Constitutional: Negative for chills and fever.  Eyes: Negative for visual disturbance.  Respiratory: Negative for shortness of breath.   Cardiovascular: Negative for chest pain.  Gastrointestinal: Positive for abdominal pain and blood in stool. Negative for constipation, diarrhea and nausea.  Genitourinary: Negative for dysuria and flank pain.  Musculoskeletal: Negative for back pain and myalgias.  Skin: Negative for rash and wound.  Neurological: Positive for dizziness. Negative for weakness, light-headedness, numbness and headaches.  All other systems reviewed and are negative.    Physical Exam Updated Vital Signs BP (!) 154/96   Pulse 72   Temp 98.5 F (36.9 C) (Oral)   Resp 15   Ht 6\' 2"  (1.88 m)   Wt (!) 171.5 kg (378 lb)   SpO2 97%   BMI 48.53 kg/m   Physical Exam  Constitutional: He is oriented to person, place, and time. He appears well-developed and well-nourished. No distress.  HENT:  Head: Normocephalic and atraumatic.  Mouth/Throat: Oropharynx is clear and moist.  Eyes: Pupils are equal, round, and reactive to light. EOM are normal.  Neck: Normal range of motion. Neck supple.  Cardiovascular: Normal rate and regular rhythm. Exam reveals no gallop and no friction rub.  No murmur heard. Pulmonary/Chest: Effort normal and breath sounds normal. No stridor. No respiratory distress. He has no wheezes. He has no rales. He exhibits no tenderness.  Abdominal: Soft. Bowel sounds are normal. He exhibits no distension. There is no tenderness. There is no rebound and no guarding. No hernia.  Genitourinary:  Genitourinary Comments: Moderate amount of soft black stool in rectum.  Guaiac positive.  Musculoskeletal: Normal range of motion. He exhibits no edema or tenderness.  Neurological: He is alert and oriented to person, place, and time.  Moves all extremities without focal deficit.  Sensation fully intact.  Skin: Skin is  warm and dry. No rash noted. He is not diaphoretic. No erythema.  Psychiatric: He has a normal mood and affect. His behavior is normal.  Nursing note and vitals reviewed.    ED Treatments / Results  Labs (all labs ordered are listed, but only abnormal results are displayed) Labs Reviewed  COMPREHENSIVE METABOLIC PANEL - Abnormal; Notable for the following components:      Result Value   ALT 13 (*)    All other components within normal limits  POC OCCULT BLOOD, ED - Abnormal; Notable for the following components:   Fecal Occult Bld POSITIVE (*)    All other components within normal limits  CBC  TYPE AND SCREEN  ABO/RH    EKG EKG Interpretation  Date/Time:  Tuesday January 07 2018 18:57:37 EDT Ventricular Rate:  55 PR  Interval:    QRS Duration: 91 QT Interval:  431 QTC Calculation: 413 R Axis:   59 Text Interpretation:  Sinus rhythm Prolonged PR interval Anteroseptal infarct, old Confirmed by Loren Racer ([id number]) on 01/07/2018 8:48:53 PM   Radiology No results found.  Procedures Procedures (including critical care time)  Medications Ordered in ED Medications  pantoprazole (PROTONIX) 80 mg in sodium chloride 0.9 % 100 mL IVPB (has no administration in time range)  pantoprazole (PROTONIX) 80 mg in sodium chloride 0.9 % 250 mL (0.32 mg/mL) infusion (has no administration in time range)   CRITICAL CARE Performed by: Loren Racer Total critical care time: 30 minutes Critical care time was exclusive of separately billable procedures and treating other patients. Critical care was necessary to treat or prevent imminent or life-threatening deterioration. Critical care was time spent personally by me on the following activities: development of treatment plan with patient and/or surrogate as well as nursing, discussions with consultants, evaluation of patient's response to treatment, examination of patient, obtaining history from patient or surrogate, ordering and performing  treatments and interventions, ordering and review of laboratory studies, ordering and review of radiographic studies, pulse oximetry and re-evaluation of patient's condition.  Initial Impression / Assessment and Plan / ED Course  I have reviewed the triage vital signs and the nursing notes.  Pertinent labs & imaging results that were available during my care of the patient were reviewed by me and considered in my medical decision making (see chart for details).    Hemoccult is positive.  Hemoglobin and vital signs are stable.  Will hold Xarelto.  Discussed with hospitalist will see patient in the emergency department.  Will consult gastroenterology.  [facility] to see patient in the morning.  Agree with Protonix drip.  Final Clinical Impressions(s) / ED Diagnoses   Final diagnoses:  Upper gastrointestinal bleed    ED Discharge Orders    None       Loren Racer, MD 01/07/18 2049    Loren Racer, MD 01/07/18 2138    Loren Racer, MD 01/25/18 2002

## 2018-01-08 ENCOUNTER — Other Ambulatory Visit: Payer: Self-pay

## 2018-01-08 DIAGNOSIS — I480 Paroxysmal atrial fibrillation: Secondary | ICD-10-CM

## 2018-01-08 DIAGNOSIS — G4733 Obstructive sleep apnea (adult) (pediatric): Secondary | ICD-10-CM | POA: Diagnosis not present

## 2018-01-08 DIAGNOSIS — M1711 Unilateral primary osteoarthritis, right knee: Secondary | ICD-10-CM | POA: Diagnosis not present

## 2018-01-08 DIAGNOSIS — K922 Gastrointestinal hemorrhage, unspecified: Secondary | ICD-10-CM | POA: Diagnosis not present

## 2018-01-08 DIAGNOSIS — Z7901 Long term (current) use of anticoagulants: Secondary | ICD-10-CM

## 2018-01-08 DIAGNOSIS — M109 Gout, unspecified: Secondary | ICD-10-CM | POA: Diagnosis not present

## 2018-01-08 DIAGNOSIS — K921 Melena: Secondary | ICD-10-CM | POA: Diagnosis not present

## 2018-01-08 LAB — HEMOGLOBIN AND HEMATOCRIT, BLOOD
HCT: 35.9 % — ABNORMAL LOW (ref 39.0–52.0)
Hemoglobin: 12 g/dL — ABNORMAL LOW (ref 13.0–17.0)

## 2018-01-08 LAB — CBC
HCT: 36.5 % — ABNORMAL LOW (ref 39.0–52.0)
HEMATOCRIT: 36 % — AB (ref 39.0–52.0)
HEMOGLOBIN: 12.2 g/dL — AB (ref 13.0–17.0)
HEMOGLOBIN: 12.6 g/dL — AB (ref 13.0–17.0)
MCH: 30.8 pg (ref 26.0–34.0)
MCH: 31.4 pg (ref 26.0–34.0)
MCHC: 33.9 g/dL (ref 30.0–36.0)
MCHC: 34.5 g/dL (ref 30.0–36.0)
MCV: 90.9 fL (ref 78.0–100.0)
MCV: 91 fL (ref 78.0–100.0)
Platelets: 139 10*3/uL — ABNORMAL LOW (ref 150–400)
Platelets: 143 10*3/uL — ABNORMAL LOW (ref 150–400)
RBC: 3.96 MIL/uL — ABNORMAL LOW (ref 4.22–5.81)
RBC: 4.01 MIL/uL — ABNORMAL LOW (ref 4.22–5.81)
RDW: 13.5 % (ref 11.5–15.5)
RDW: 13.5 % (ref 11.5–15.5)
WBC: 4.8 10*3/uL (ref 4.0–10.5)
WBC: 4.6 10*3/uL (ref 4.0–10.5)

## 2018-01-08 LAB — HIV ANTIBODY (ROUTINE TESTING W REFLEX): HIV SCREEN 4TH GENERATION: NONREACTIVE

## 2018-01-08 MED ORDER — PANTOPRAZOLE SODIUM 40 MG IV SOLR
40.0000 mg | Freq: Two times a day (BID) | INTRAVENOUS | Status: DC
  Administered 2018-01-08 (×2): 40 mg via INTRAVENOUS
  Filled 2018-01-08 (×4): qty 40

## 2018-01-08 MED ORDER — CLONIDINE HCL 0.2 MG/24HR TD PTWK
0.2000 mg | MEDICATED_PATCH | TRANSDERMAL | Status: DC
  Administered 2018-01-08: 0.2 mg via TRANSDERMAL
  Filled 2018-01-08: qty 1

## 2018-01-08 MED ORDER — ONDANSETRON HCL 4 MG PO TABS: 4.0000 mg | ORAL_TABLET | Freq: Four times a day (QID) | ORAL | Status: DC | PRN

## 2018-01-08 MED ORDER — ONDANSETRON HCL 4 MG/2ML IJ SOLN
4.0000 mg | Freq: Four times a day (QID) | INTRAMUSCULAR | Status: DC | PRN
  Administered 2018-01-09: 4 mg via INTRAVENOUS
  Filled 2018-01-08: qty 2

## 2018-01-08 MED ORDER — ALLOPURINOL 300 MG PO TABS
300.0000 mg | ORAL_TABLET | Freq: Every day | ORAL | Status: DC
  Administered 2018-01-08 – 2018-01-09 (×2): 300 mg via ORAL
  Filled 2018-01-08 (×2): qty 1

## 2018-01-08 MED ORDER — HYDROCODONE-ACETAMINOPHEN 5-325 MG PO TABS: 1.0000 | ORAL_TABLET | ORAL | Status: DC | PRN

## 2018-01-08 NOTE — Consult Note (Signed)
Referring Provider: ER Primary Care Physician:  Doristine Bosworth, MD Primary Gastroenterologist:  Dr. Randa Evens  Reason for Consultation:  melena  HPI: Leon Thomas is a 62 y.o. male with past medical history of atrial fibrillation currently on Zoraida to, history of obstructive sleep apnea and hypertension presented to the hospital with chief complaint of black tarry stool for 3 days.  Patient seen and examined while in the emergency room. He started noticing black tarry stool 3 days ago. Last bowel movement this morning while in the emergency room. Denied any associated nausea vomiting. Denied any abdominal pain but complaining of abdominal bloating that gets better with passing flatus. Denied diarrhea constipation. Denied bright red blood per rectum. Denied acid reflux, dysphagia or odynophagia.  Takes ibuprofen almost every other day for arthritis.   Last colonoscopy in February 2015 was normal. Repeat was recommended in 5 years because of personal history of colon polyps.  Past Medical History:  Diagnosis Date  . Arthritis    knees  . Dysrhythmia    04-21-13 Cardioverted at [facility] for A. Fib-Dr. Jacinto Halim. No further problems  . Gout   . Hypertension   . Sleep apnea    cpap nightly, settings 3    Past Surgical History:  Procedure Laterality Date  . CARDIOVERSION N/A 04/21/2013   Procedure: CARDIOVERSION;  Surgeon: [name], MD;  Location: [facility] ENDOSCOPY;  Service: Cardiovascular;  Laterality: N/A;  h&p in file- HW  . COLONOSCOPY WITH PROPOFOL N/A 09/04/2013   Procedure: COLONOSCOPY WITH PROPOFOL;  Surgeon: Vertell Novak., MD;  Location: [facility] ENDOSCOPY;  Service: Endoscopy;  Laterality: N/A;  . FOOT SURGERY Left   . JOINT REPLACEMENT Left   . ORIF ANKLE FRACTURE Right     Prior to Admission medications   Medication Sig Start Date End Date Taking? Authorizing Provider  allopurinol (ZYLOPRIM) 300 MG tablet Take 1 tablet (300 mg total) by mouth daily. 12/12/17  Yes  Collie Siad A, MD  carvedilol (COREG) 25 MG tablet TAKE 2 TABLETS BY MOUTH DAILY 03/19/17  Yes [provider]  cloNIDine (CATAPRES - DOSED IN MG/24 HR) 0.2 mg/24hr patch Place 1 patch (0.2 mg total) onto the skin once a week. Patient taking differently: Place 0.2 mg onto the skin as directed. As needed weekly for blood pressure 12/12/17  Yes Stallings, Zoe A, MD  eplerenone (INSPRA) 25 MG tablet Take 1 tablet (25 mg total) by mouth daily. 12/12/17  Yes Stallings, Zoe A, MD  furosemide (LASIX) 40 MG tablet TAKE 1 TABLET BY MOUTH EVERY DAY 12/03/17  Yes Stallings, Zoe A, MD  HYDROcodone-acetaminophen (NORCO) 10-325 MG tablet Take 1 tablet by mouth every 4 (four) hours as needed for severe pain. For pain 12/12/17  Yes Stallings, Zoe A, MD  olmesartan (BENICAR) 40 MG tablet Take 40 mg by mouth daily.   Yes [provider]  omega-3 acid ethyl esters (LOVAZA) 1 g capsule Take 1 g by mouth daily.    Yes [provider]  Rivaroxaban (XARELTO) 20 MG TABS tablet Take 20 mg by mouth daily.    Yes [provider]  polyethylene glycol (MIRALAX / GLYCOLAX) packet Take 17 g by mouth daily as needed. For constipation    [provider]    Scheduled Meds: . allopurinol  300 mg Oral Daily  . cloNIDine  0.2 mg Transdermal Weekly   Continuous Infusions: . pantoprozole (PROTONIX) infusion 8 mg/hr (01/07/18 2107)   PRN Meds:.HYDROcodone-acetaminophen, ondansetron **OR** ondansetron (ZOFRAN) IV  Allergies as of 01/07/2018  . (No Known Allergies)    Family History  Problem Relation Age of Onset  . Diabetes Mother   . Alzheimer's disease Mother   . Diabetes Father   . Diabetes Brother   . Stroke Sister     Social History   Socioeconomic History  . Marital status: Single    Spouse name: Not on file  . Number of children: Not on file  . Years of education: Not on file  . Highest education level: 11th grade  Occupational History  . Not on file  Social Needs   . Financial resource strain: Very hard  . Food insecurity:    Worry: Often true    Inability: Often true  . Transportation needs:    Medical: No    Non-medical: No  Tobacco Use  . Smoking status: Never Smoker  . Smokeless tobacco: Never Used  Substance and Sexual Activity  . Alcohol use: Yes    Alcohol/week: 0.6 oz    Types: 1 Cans of beer per week    Comment: Daily about 4-5 12oz beers   . Drug use: No  . Sexual activity: Not on file  Lifestyle  . Physical activity:    Days per week: 0 days    Minutes per session: 0 min  . Stress: Very much  Relationships  . Social connections:    Talks on phone: More than three times a week    Gets together: More than three times a week    Attends religious service: Never    Active member of club or organization: No    Attends meetings of clubs or organizations: Never    Relationship status: Never married  . Intimate partner violence:    Fear of current or ex partner: No    Emotionally abused: No    Physically abused: No    Forced sexual activity: No  Other Topics Concern  . Not on file  Social History Narrative  . Not on file    Review of Systems:Review of Systems  Constitutional: Negative for chills and fever.  HENT: Negative for hearing loss and tinnitus.   Eyes: Negative for blurred vision and double vision.  Respiratory: Negative for cough and hemoptysis.   Cardiovascular: Negative for chest pain and palpitations.  Gastrointestinal: Positive for melena. Negative for abdominal pain, constipation, diarrhea, heartburn, nausea and vomiting.  Genitourinary: Negative for dysuria and urgency.  Musculoskeletal: Positive for joint pain.  Skin: Negative for itching and rash.  Neurological: Negative for seizures and loss of consciousness.  Endo/Heme/Allergies: Does not bruise/bleed easily.  Psychiatric/Behavioral: Negative for hallucinations and suicidal ideas.    Physical Exam: Vital signs: Vitals:   01/08/18 0330 01/08/18  0430  BP: 130/86 (!) 142/79  Pulse: 62 (!) 55  Resp: 13 17  Temp:    SpO2: 96% 96%     Physical Exam  Constitutional: He is oriented to person, place, and time. No distress.  Morbidly obese  HENT:  Head: Normocephalic and atraumatic.  Mouth/Throat: Oropharynx is clear and moist. No oropharyngeal exudate.  Eyes: EOM are normal. No scleral icterus.  Neck: Normal range of motion. Neck supple.  Cardiovascular: Normal rate, regular rhythm and normal heart sounds.  Pulmonary/Chest: Effort normal and breath sounds normal. No respiratory distress.  Abdominal: Soft. Bowel sounds are normal. He exhibits no distension. There is no tenderness. There is no rebound and no guarding.  Musculoskeletal: Normal range of motion. He exhibits no edema.  Neurological: He is  alert and oriented to person, place, and time.  Skin: Skin is warm. No erythema.  Psychiatric: He has a normal mood and affect. Judgment normal.  Nursing note and vitals reviewed.  GI:  Lab Results: Recent Labs    01/07/18 1431 01/08/18 0100 01/08/18 0739  WBC 5.0  --  4.8  HGB 13.6 12.0* 12.2*  HCT 39.8 35.9* 36.0*  PLT 153  --  139*   BMET Recent Labs    01/07/18 1431  NA 138  K 4.0  CL 103  CO2 28  GLUCOSE 99  BUN 19  CREATININE 1.10  CALCIUM 9.3   LFT Recent Labs    01/07/18 1431  PROT 7.7  ALBUMIN 4.0  AST 16  ALT 13*  ALKPHOS 47  BILITOT 1.0   PT/INR No results for input(s): LABPROT, INR in the last 72 hours.   Studies/Results: No results found.  Impression/Plan: - melena in setting of ibuprofen use and Xarelto.  - Atrial fibrillation. Last dose of Xarelto 06/11 10 am  - morbid obesity  Recommendations ----------------------- - Mild drop in hemoglobin noted but hemoglobin relatively stable. - EGD tomorrow. - Monitor H&H. Continue PPI. Change to IV BID   Risks (bleeding, infection, bowel perforation that could require surgery, sedation-related changes in cardiopulmonary systems),  benefits (identification and possible treatment of source of symptoms, exclusion of certain causes of symptoms), and alternatives (watchful waiting, radiographic imaging studies, empiric medical treatment)  were explained to patient in detail and patient wishes to proceed.    LOS: 0 days   Kathi DerParag Aman Bonet  MD, FACP 01/08/2018, 8:08 AM  Contact #  [phone]

## 2018-01-08 NOTE — ED Notes (Signed)
ED TO INPATIENT HANDOFF REPORT  Name/Age/Gender Leon Thomas 62 y.o. male  Code Status    Code Status Orders  (From admission, onward)        Start     Ordered   01/08/18 0123  Full code  Continuous     01/08/18 0122    Code Status History    This patient has a current code status but no historical code status.      Home/SNF/Other Home  Chief Complaint Blood in stool   Level of Care/Admitting Diagnosis ED Disposition    ED Disposition Condition [facility] Area: [place] [100102]  Level of Care: Telemetry [5]  Admit to tele based on following criteria: Other see comments  Comments: GI bleed  Diagnosis: GI bleed [748270]  Admitting Physician: Bethena Roys [[id number]]  Attending Physician: Bethena Roys Nessa.Cuff  PT Class (Do Not Modify): Observation [104]  PT Acc Code (Do Not Modify): Observation [10022]       Medical History Past Medical History:  Diagnosis Date  . Arthritis    knees  . Dysrhythmia    04-21-13 Cardioverted at [facility] for A. Fib-Dr. Einar Gip. No further problems  . Gout   . Hypertension   . Sleep apnea    cpap nightly, settings 3    Allergies No Known Allergies  IV Location/Drains/Wounds Patient Lines/Drains/Airways Status   Active Line/Drains/Airways    Name:   Placement date:   Placement time:   Site:   Days:   Peripheral IV 01/07/18 Left Antecubital   01/07/18    1842    Antecubital   1          Labs/Imaging Results for orders placed or performed during the hospital encounter of 01/07/18 (from the past 48 hour(s))  Comprehensive metabolic panel     Status: Abnormal   Collection Time: 01/07/18  2:31 PM  Result Value Ref Range   Sodium 138 135 - 145 mmol/L   Potassium 4.0 3.5 - 5.1 mmol/L   Chloride 103 101 - 111 mmol/L   CO2 28 22 - 32 mmol/L   Glucose, Bld 99 65 - 99 mg/dL   BUN 19 6 - 20 mg/dL   Creatinine, Ser 1.10 0.61 - 1.24 mg/dL   Calcium 9.3 8.9 - 10.3 mg/dL   Total Protein 7.7 6.5 - 8.1 g/dL   Albumin 4.0 3.5 - 5.0 g/dL   AST 16 15 - 41 U/L   ALT 13 (L) 17 - 63 U/L   Alkaline Phosphatase 47 38 - 126 U/L   Total Bilirubin 1.0 0.3 - 1.2 mg/dL   GFR calc non Af Amer >60 >60 mL/min   GFR calc Af Amer >60 >60 mL/min    Comment: (NOTE) The eGFR has been calculated using the CKD EPI equation. This calculation has not been validated in all clinical situations. eGFR's persistently <60 mL/min signify possible Chronic Kidney Disease.    Anion gap 7 5 - 15    Comment: Performed at [facility], [place] [address] [place], [place] [address]  CBC     Status: None   Collection Time: 01/07/18  2:31 PM  Result Value Ref Range   WBC 5.0 4.0 - 10.5 K/uL   RBC 4.40 4.22 - 5.81 MIL/uL   Hemoglobin 13.6 13.0 - 17.0 g/dL   HCT 39.8 39.0 - 52.0 %   MCV 90.5 78.0 - 100.0 fL   MCH 30.9 26.0 - 34.0  pg   MCHC 34.2 30.0 - 36.0 g/dL   RDW 13.4 11.5 - 15.5 %   Platelets 153 150 - 400 K/uL    Comment: Performed at [facility], [place] [address], [place], [place] [address]  POC occult blood, ED     Status: Abnormal   Collection Time: 01/07/18  6:35 PM  Result Value Ref Range   Fecal Occult Bld POSITIVE (A) NEGATIVE  ABO/Rh     Status: None   Collection Time: 01/07/18  6:40 PM  Result Value Ref Range   ABO/RH(D)      B POS Performed at [facility], [place] [address], [place], [place] [address]   Type and screen [place]     Status: None   Collection Time: 01/07/18  6:41 PM  Result Value Ref Range   ABO/RH(D) B POS    Antibody Screen NEG    Sample Expiration      01/10/2018 Performed at [facility], [place] [address] [place], [place] [address]   Hemoglobin and hematocrit, blood     Status: Abnormal   Collection Time: 01/08/18  1:00 AM  Result Value Ref Range   Hemoglobin 12.0 (L) 13.0 - 17.0 g/dL   HCT 35.9 (L) 39.0 - 52.0 %    Comment: Performed at [facility], [place] [address] [place], [place] [address]  CBC     Status: Abnormal   Collection Time: 01/08/18  7:39 AM  Result Value Ref Range   WBC 4.8 4.0 - 10.5 K/uL   RBC 3.96 (L) 4.22 - 5.81 MIL/uL   Hemoglobin 12.2 (L) 13.0 - 17.0 g/dL   HCT 36.0 (L) 39.0 - 52.0 %   MCV 90.9 78.0 - 100.0 fL   MCH 30.8 26.0 - 34.0 pg   MCHC 33.9 30.0 - 36.0 g/dL   RDW 13.5 11.5 - 15.5 %   Platelets 139 (L) 150 - 400 K/uL    Comment: Performed at [facility], [place] [address] [place], [place] [address]   No results found.  Pending Labs Unresulted Labs (From admission, onward)   Start     Ordered   01/08/18 0600  CBC  Now then every 12 hours,   R     01/07/18 2214   01/08/18 0123  HIV antibody (Routine Testing)  Once,   R     01/08/18 0122      Vitals/Pain Today's Vitals   01/08/18 0230 01/08/18 0330 01/08/18 0430 01/08/18 0833  BP: 139/86 130/86 (!) 142/79 135/72  Pulse: (!) 58 62 (!) 55 (!) 58  Resp: '14 13 17 19  ' Temp:      TempSrc:      SpO2: 98% 96% 96% 99%  Weight:      Height:      PainSc:        Isolation Precautions No active isolations  Medications Medications  allopurinol (ZYLOPRIM) tablet 300 mg (300 mg Oral Given 01/08/18 0938)  cloNIDine (CATAPRES - Dosed in mg/24 hr) patch 0.2 mg (0.2 mg Transdermal Patch Applied 01/08/18 0203)  HYDROcodone-acetaminophen (NORCO/VICODIN) 5-325 MG per tablet 1-2 tablet (has no administration in time range)  ondansetron (ZOFRAN) tablet 4 mg (has no administration in time range)    Or  ondansetron (ZOFRAN) injection 4 mg (has no administration in time range)  pantoprazole (PROTONIX) injection 40 mg (40 mg Intravenous Given 01/08/18 0939)  pantoprazole (PROTONIX) 80 mg in sodium chloride 0.9 %  100 mL IVPB (0 mg Intravenous Stopped 01/07/18 2126)    Mobility walks

## 2018-01-08 NOTE — Progress Notes (Signed)
Patient is admitted to 1440 from ED. Patient is AO X4 and family is at the bedside

## 2018-01-08 NOTE — ED Notes (Signed)
Bed: [id number] Expected date:  Expected time:  Means of arrival:  Comments: ROOM 12

## 2018-01-08 NOTE — H&P (View-Only) (Signed)
Referring Provider: ER Primary Care Physician:  Doristine Bosworth, MD Primary Gastroenterologist:  Dr. Randa Evens  Reason for Consultation:  melena  HPI: Leon Thomas is a 62 y.o. male with past medical history of atrial fibrillation currently on Zoraida to, history of obstructive sleep apnea and hypertension presented to the hospital with chief complaint of black tarry stool for 3 days.  Patient seen and examined while in the emergency room. He started noticing black tarry stool 3 days ago. Last bowel movement this morning while in the emergency room. Denied any associated nausea vomiting. Denied any abdominal pain but complaining of abdominal bloating that gets better with passing flatus. Denied diarrhea constipation. Denied bright red blood per rectum. Denied acid reflux, dysphagia or odynophagia.  Takes ibuprofen almost every other day for arthritis.   Last colonoscopy in February 2015 was normal. Repeat was recommended in 5 years because of personal history of colon polyps.  Past Medical History:  Diagnosis Date  . Arthritis    knees  . Dysrhythmia    04-21-13 Cardioverted at [facility] for A. Fib-Dr. Jacinto Halim. No further problems  . Gout   . Hypertension   . Sleep apnea    cpap nightly, settings 3    Past Surgical History:  Procedure Laterality Date  . CARDIOVERSION N/A 04/21/2013   Procedure: CARDIOVERSION;  Surgeon: [name], MD;  Location: [facility] ENDOSCOPY;  Service: Cardiovascular;  Laterality: N/A;  h&p in file- HW  . COLONOSCOPY WITH PROPOFOL N/A 09/04/2013   Procedure: COLONOSCOPY WITH PROPOFOL;  Surgeon: Vertell Novak., MD;  Location: [facility] ENDOSCOPY;  Service: Endoscopy;  Laterality: N/A;  . FOOT SURGERY Left   . JOINT REPLACEMENT Left   . ORIF ANKLE FRACTURE Right     Prior to Admission medications   Medication Sig Start Date End Date Taking? Authorizing Provider  allopurinol (ZYLOPRIM) 300 MG tablet Take 1 tablet (300 mg total) by mouth daily. 12/12/17  Yes  Collie Siad A, MD  carvedilol (COREG) 25 MG tablet TAKE 2 TABLETS BY MOUTH DAILY 03/19/17  Yes [provider]  cloNIDine (CATAPRES - DOSED IN MG/24 HR) 0.2 mg/24hr patch Place 1 patch (0.2 mg total) onto the skin once a week. Patient taking differently: Place 0.2 mg onto the skin as directed. As needed weekly for blood pressure 12/12/17  Yes Stallings, Zoe A, MD  eplerenone (INSPRA) 25 MG tablet Take 1 tablet (25 mg total) by mouth daily. 12/12/17  Yes Stallings, Zoe A, MD  furosemide (LASIX) 40 MG tablet TAKE 1 TABLET BY MOUTH EVERY DAY 12/03/17  Yes Stallings, Zoe A, MD  HYDROcodone-acetaminophen (NORCO) 10-325 MG tablet Take 1 tablet by mouth every 4 (four) hours as needed for severe pain. For pain 12/12/17  Yes Stallings, Zoe A, MD  olmesartan (BENICAR) 40 MG tablet Take 40 mg by mouth daily.   Yes [provider]  omega-3 acid ethyl esters (LOVAZA) 1 g capsule Take 1 g by mouth daily.    Yes [provider]  Rivaroxaban (XARELTO) 20 MG TABS tablet Take 20 mg by mouth daily.    Yes [provider]  polyethylene glycol (MIRALAX / GLYCOLAX) packet Take 17 g by mouth daily as needed. For constipation    [provider]    Scheduled Meds: . allopurinol  300 mg Oral Daily  . cloNIDine  0.2 mg Transdermal Weekly   Continuous Infusions: . pantoprozole (PROTONIX) infusion 8 mg/hr (01/07/18 2107)   PRN Meds:.HYDROcodone-acetaminophen, ondansetron **OR** ondansetron (ZOFRAN) IV  Allergies as of 01/07/2018  . (No Known Allergies)    Family History  Problem Relation Age of Onset  . Diabetes Mother   . Alzheimer's disease Mother   . Diabetes Father   . Diabetes Brother   . Stroke Sister     Social History   Socioeconomic History  . Marital status: Single    Spouse name: Not on file  . Number of children: Not on file  . Years of education: Not on file  . Highest education level: 11th grade  Occupational History  . Not on file  Social Needs   . Financial resource strain: Very hard  . Food insecurity:    Worry: Often true    Inability: Often true  . Transportation needs:    Medical: No    Non-medical: No  Tobacco Use  . Smoking status: Never Smoker  . Smokeless tobacco: Never Used  Substance and Sexual Activity  . Alcohol use: Yes    Alcohol/week: 0.6 oz    Types: 1 Cans of beer per week    Comment: Daily about 4-5 12oz beers   . Drug use: No  . Sexual activity: Not on file  Lifestyle  . Physical activity:    Days per week: 0 days    Minutes per session: 0 min  . Stress: Very much  Relationships  . Social connections:    Talks on phone: More than three times a week    Gets together: More than three times a week    Attends religious service: Never    Active member of club or organization: No    Attends meetings of clubs or organizations: Never    Relationship status: Never married  . Intimate partner violence:    Fear of current or ex partner: No    Emotionally abused: No    Physically abused: No    Forced sexual activity: No  Other Topics Concern  . Not on file  Social History Narrative  . Not on file    Review of Systems:Review of Systems  Constitutional: Negative for chills and fever.  HENT: Negative for hearing loss and tinnitus.   Eyes: Negative for blurred vision and double vision.  Respiratory: Negative for cough and hemoptysis.   Cardiovascular: Negative for chest pain and palpitations.  Gastrointestinal: Positive for melena. Negative for abdominal pain, constipation, diarrhea, heartburn, nausea and vomiting.  Genitourinary: Negative for dysuria and urgency.  Musculoskeletal: Positive for joint pain.  Skin: Negative for itching and rash.  Neurological: Negative for seizures and loss of consciousness.  Endo/Heme/Allergies: Does not bruise/bleed easily.  Psychiatric/Behavioral: Negative for hallucinations and suicidal ideas.    Physical Exam: Vital signs: Vitals:   01/08/18 0330 01/08/18  0430  BP: 130/86 (!) 142/79  Pulse: 62 (!) 55  Resp: 13 17  Temp:    SpO2: 96% 96%     Physical Exam  Constitutional: He is oriented to person, place, and time. No distress.  Morbidly obese  HENT:  Head: Normocephalic and atraumatic.  Mouth/Throat: Oropharynx is clear and moist. No oropharyngeal exudate.  Eyes: EOM are normal. No scleral icterus.  Neck: Normal range of motion. Neck supple.  Cardiovascular: Normal rate, regular rhythm and normal heart sounds.  Pulmonary/Chest: Effort normal and breath sounds normal. No respiratory distress.  Abdominal: Soft. Bowel sounds are normal. He exhibits no distension. There is no tenderness. There is no rebound and no guarding.  Musculoskeletal: Normal range of motion. He exhibits no edema.  Neurological: He is  alert and oriented to person, place, and time.  Skin: Skin is warm. No erythema.  Psychiatric: He has a normal mood and affect. Judgment normal.  Nursing note and vitals reviewed.  GI:  Lab Results: Recent Labs    01/07/18 1431 01/08/18 0100 01/08/18 0739  WBC 5.0  --  4.8  HGB 13.6 12.0* 12.2*  HCT 39.8 35.9* 36.0*  PLT 153  --  139*   BMET Recent Labs    01/07/18 1431  NA 138  K 4.0  CL 103  CO2 28  GLUCOSE 99  BUN 19  CREATININE 1.10  CALCIUM 9.3   LFT Recent Labs    01/07/18 1431  PROT 7.7  ALBUMIN 4.0  AST 16  ALT 13*  ALKPHOS 47  BILITOT 1.0   PT/INR No results for input(s): LABPROT, INR in the last 72 hours.   Studies/Results: No results found.  Impression/Plan: - melena in setting of ibuprofen use and Xarelto.  - Atrial fibrillation. Last dose of Xarelto 06/11 10 am  - morbid obesity  Recommendations ----------------------- - Mild drop in hemoglobin noted but hemoglobin relatively stable. - EGD tomorrow. - Monitor H&H. Continue PPI. Change to IV BID   Risks (bleeding, infection, bowel perforation that could require surgery, sedation-related changes in cardiopulmonary systems),  benefits (identification and possible treatment of source of symptoms, exclusion of certain causes of symptoms), and alternatives (watchful waiting, radiographic imaging studies, empiric medical treatment)  were explained to patient in detail and patient wishes to proceed.    LOS: 0 days   Kathi DerParag Nelwyn Hebdon  MD, FACP 01/08/2018, 8:08 AM  Contact #  [phone]

## 2018-01-08 NOTE — Progress Notes (Signed)
PROGRESS NOTE  MOSI HANNOLD ZOX:[id number] DOB: [birth year] DOA: 01/07/2018 PCP: Doristine Bosworth, MD  HPI/Recap of past 24 hours: Mr. Ferrebee is a 62 year old male with past medical history significant for OSA, paroxysmal atrial fibrillation on Xarelto, chronic pain right knee, hypertension, gout, obesity who presented with complaints of black tarry stools with onset 3 days ago.  On presentation to the ED FOBT positive.  Hemoglobin trending down but stable at 12.2 from 13.6.  GI consulted and planning EGD in the morning 01/09/2018.  N.p.o. after midnight.  01/16/2018: Patient seen and examined at his bedside.  He has no new complaints.  He denies nausea or abdominal pain.  Assessment/Plan: Active Problems:   OSA (obstructive sleep apnea)   Atrial fibrillation (HCC)   Gout, arthritis   Morbid obesity (HCC)   Chronic anticoagulation   Primary osteoarthritis of right knee   GI bleed  Suspected upper GI bleed Self-reported black tarry stool FOBT positive Self-reported history of chronic NSAID use GI consulted and planning on EGD in the morning 01/09/2018 N.p.o. after midnight IV Protonix 40 mg twice daily Repeat CBC in the morning Continue to hold Xarelto  Paroxysmal A. Fib Rate controlled Continue to hold Xarelto due to possible EGD in the morning Hold carvedilol due to tachycardia  Sinus bradycardia Admission EKG revealed sinus rhythm with rate of 55 Continue to hold Coreg  Hypertension Blood pressures well controlled Continue clonidine patch Hold Lasix, eplerenone, and olmesartan due to suspected GI bleed  History of gout No acute issues Continue allopurinol  Obesity BMI 48 Weight loss outpatient   Code Status: Full code  Family Communication: None at bedside  Disposition Plan: Home when clinically stable.  Possibly tomorrow 01/09/2018 after EGD   Consultants:  GI  Procedures:  Possible EGD in the morning  Antimicrobials:  None  DVT  prophylaxis: SCDs   Objective: Vitals:   01/08/18 0330 01/08/18 0430 01/08/18 0833 01/08/18 1038  BP: 130/86 (!) 142/79 135/72 116/72  Pulse: 62 (!) 55 (!) 58 (!) 55  Resp: 13 17 19 20   Temp:      TempSrc:      SpO2: 96% 96% 99% 95%  Weight:      Height:        Intake/Output Summary (Last 24 hours) at 01/08/2018 1227 Last data filed at 01/07/2018 2240 Gross per 24 hour  Intake 100 ml  Output 200 ml  Net -100 ml   Filed Weights   01/07/18 1242  Weight: (!) 171.5 kg (378 lb)    Exam:  . General: 62 y.o. year-old male well developed well nourished in no acute distress.  Alert and oriented x3. . Cardiovascular: Regular rate and rhythm with no rubs or gallops.  No thyromegaly or JVD noted.   Marland Kitchen Respiratory: Clear to auscultation with no wheezes or rales. Good inspiratory effort. . Abdomen: Soft nontender nondistended with normal bowel sounds x4 quadrants. . Musculoskeletal: No lower extremity edema. 2/4 pulses in all 4 extremities. . Skin: No ulcerative lesions noted or rashes, . Psychiatry: Mood is appropriate for condition and setting   Data Reviewed: CBC: Recent Labs  Lab 01/07/18 1431 01/08/18 0100 01/08/18 0739  WBC 5.0  --  4.8  HGB 13.6 12.0* 12.2*  HCT 39.8 35.9* 36.0*  MCV 90.5  --  90.9  PLT 153  --  139*   Basic Metabolic Panel: Recent Labs  Lab 01/07/18 1431  NA 138  K 4.0  CL 103  CO2 28  GLUCOSE  99  BUN 19  CREATININE 1.10  CALCIUM 9.3   GFR: Estimated Creatinine Clearance: 119.1 mL/min (by C-G formula based on SCr of 1.1 mg/dL). Liver Function Tests: Recent Labs  Lab 01/07/18 1431  AST 16  ALT 13*  ALKPHOS 47  BILITOT 1.0  PROT 7.7  ALBUMIN 4.0   No results for input(s): LIPASE, AMYLASE in the last 168 hours. No results for input(s): AMMONIA in the last 168 hours. Coagulation Profile: No results for input(s): INR, PROTIME in the last 168 hours. Cardiac Enzymes: No results for input(s): CKTOTAL, CKMB, CKMBINDEX, TROPONINI in  the last 168 hours. BNP (last 3 results) No results for input(s): PROBNP in the last 8760 hours. HbA1C: No results for input(s): HGBA1C in the last 72 hours. CBG: No results for input(s): GLUCAP in the last 168 hours. Lipid Profile: No results for input(s): CHOL, HDL, LDLCALC, TRIG, CHOLHDL, LDLDIRECT in the last 72 hours. Thyroid Function Tests: No results for input(s): TSH, T4TOTAL, FREET4, T3FREE, THYROIDAB in the last 72 hours. Anemia Panel: No results for input(s): VITAMINB12, FOLATE, FERRITIN, TIBC, IRON, RETICCTPCT in the last 72 hours. Urine analysis:    Component Value Date/Time   COLORURINE YELLOW 02/09/2007 0341   APPEARANCEUR CLEAR 02/09/2007 0341   LABSPEC 1.019 02/09/2007 0341   PHURINE 5.5 02/09/2007 0341   GLUCOSEU NEGATIVE 02/09/2007 0341   HGBUR NEGATIVE 02/09/2007 0341   BILIRUBINUR NEGATIVE 02/09/2007 0341   KETONESUR NEGATIVE 02/09/2007 0341   PROTEINUR 30 (A) 02/09/2007 0341   UROBILINOGEN 0.2 02/09/2007 0341   NITRITE NEGATIVE 02/09/2007 0341   LEUKOCYTESUR NEGATIVE 02/09/2007 0341   Sepsis Labs: @LABRCNTIP (procalcitonin:4,lacticidven:4)  )No results found for this or any previous visit (from the past 240 hour(s)).    Studies: No results found.  Scheduled Meds: . allopurinol  300 mg Oral Daily  . cloNIDine  0.2 mg Transdermal Weekly  . pantoprazole (PROTONIX) IV  40 mg Intravenous Q12H    Continuous Infusions:   LOS: 0 days     Darlin Droparole N Kevontae Burgoon, MD [facility] Pager [phone]  If 7PM-7AM, please contact night-coverage [email or web address] Password [email or web address] 01/08/2018, 12:27 PM

## 2018-01-09 ENCOUNTER — Encounter (HOSPITAL_COMMUNITY): Payer: Self-pay | Admitting: Anesthesiology

## 2018-01-09 ENCOUNTER — Observation Stay (HOSPITAL_COMMUNITY): Payer: Medicare Other | Admitting: Anesthesiology

## 2018-01-09 ENCOUNTER — Encounter (HOSPITAL_COMMUNITY)
Admission: EM | Disposition: A | Discharge status: Home / Self Care | Payer: Self-pay | Source: Home / Self Care | Attending: Emergency Medicine

## 2018-01-09 DIAGNOSIS — K921 Melena: Secondary | ICD-10-CM | POA: Diagnosis not present

## 2018-01-09 DIAGNOSIS — B9681 Helicobacter pylori [H. pylori] as the cause of diseases classified elsewhere: Secondary | ICD-10-CM | POA: Diagnosis not present

## 2018-01-09 DIAGNOSIS — K922 Gastrointestinal hemorrhage, unspecified: Secondary | ICD-10-CM | POA: Diagnosis not present

## 2018-01-09 DIAGNOSIS — I480 Paroxysmal atrial fibrillation: Secondary | ICD-10-CM | POA: Diagnosis not present

## 2018-01-09 DIAGNOSIS — K2950 Unspecified chronic gastritis without bleeding: Secondary | ICD-10-CM | POA: Diagnosis not present

## 2018-01-09 DIAGNOSIS — K269 Duodenal ulcer, unspecified as acute or chronic, without hemorrhage or perforation: Secondary | ICD-10-CM | POA: Diagnosis not present

## 2018-01-09 DIAGNOSIS — M109 Gout, unspecified: Secondary | ICD-10-CM | POA: Diagnosis not present

## 2018-01-09 HISTORY — PX: BIOPSY: SHX5522

## 2018-01-09 HISTORY — PX: ESOPHAGOGASTRODUODENOSCOPY (EGD) WITH PROPOFOL: SHX5813

## 2018-01-09 LAB — CBC
HCT: 36.4 % — ABNORMAL LOW (ref 39.0–52.0)
Hemoglobin: 12.5 g/dL — ABNORMAL LOW (ref 13.0–17.0)
MCH: 31 pg (ref 26.0–34.0)
MCHC: 34.3 g/dL (ref 30.0–36.0)
MCV: 90.3 fL (ref 78.0–100.0)
PLATELETS: 134 10*3/uL — AB (ref 150–400)
RBC: 4.03 MIL/uL — ABNORMAL LOW (ref 4.22–5.81)
RDW: 13.5 % (ref 11.5–15.5)
WBC: 4.9 10*3/uL (ref 4.0–10.5)

## 2018-01-09 LAB — BASIC METABOLIC PANEL
Anion gap: 6 (ref 5–15)
BUN: 11 mg/dL (ref 6–20)
CALCIUM: 9 mg/dL (ref 8.9–10.3)
CO2: 28 mmol/L (ref 22–32)
CREATININE: 1.04 mg/dL (ref 0.61–1.24)
Chloride: 103 mmol/L (ref 101–111)
GFR calc Af Amer: >60 mL/min (ref >60)
GLUCOSE: 106 mg/dL — AB (ref 65–99)
POTASSIUM: 3.9 mmol/L (ref 3.5–5.1)
SODIUM: 137 mmol/L (ref 135–145)

## 2018-01-09 SURGERY — ESOPHAGOGASTRODUODENOSCOPY (EGD) WITH PROPOFOL
Anesthesia: Monitor Anesthesia Care

## 2018-01-09 MED ORDER — PROPOFOL 500 MG/50ML IV EMUL
INTRAVENOUS | Status: DC | PRN
  Administered 2018-01-09: 170 ug/kg/min via INTRAVENOUS

## 2018-01-09 MED ORDER — MIDAZOLAM HCL 2 MG/2ML IJ SOLN
INTRAMUSCULAR | Status: AC
  Filled 2018-01-09: qty 2

## 2018-01-09 MED ORDER — PANTOPRAZOLE SODIUM 40 MG PO TBEC
40.0000 mg | DELAYED_RELEASE_TABLET | Freq: Two times a day (BID) | ORAL | Status: DC
  Administered 2018-01-09: 40 mg via ORAL
  Filled 2018-01-09: qty 1

## 2018-01-09 MED ORDER — RIVAROXABAN 20 MG PO TABS
20.0000 mg | ORAL_TABLET | Freq: Every day | ORAL | Status: DC
Start: 2018-01-09 — End: 2018-01-09
  Administered 2018-01-09: 20 mg via ORAL
  Filled 2018-01-09: qty 1

## 2018-01-09 MED ORDER — MIDAZOLAM HCL 5 MG/5ML IJ SOLN
INTRAMUSCULAR | Status: DC | PRN
Start: 2018-01-09 — End: 2018-01-09
  Administered 2018-01-09: 2 mg via INTRAVENOUS

## 2018-01-09 MED ORDER — PROPOFOL 10 MG/ML IV BOLUS
INTRAVENOUS | Status: AC
  Filled 2018-01-09: qty 20

## 2018-01-09 MED ORDER — PROPOFOL 10 MG/ML IV BOLUS
INTRAVENOUS | Status: AC
  Filled 2018-01-09: qty 40

## 2018-01-09 MED ORDER — PROPOFOL 500 MG/50ML IV EMUL
INTRAVENOUS | Status: DC | PRN
  Administered 2018-01-09 (×2): 50 mg via INTRAVENOUS

## 2018-01-09 MED ORDER — LACTATED RINGERS IV SOLN
INTRAVENOUS | Status: DC
  Administered 2018-01-09: 10:00:00 via INTRAVENOUS

## 2018-01-09 MED ORDER — PANTOPRAZOLE SODIUM 40 MG PO TBEC: 40.0000 mg | DELAYED_RELEASE_TABLET | Freq: Two times a day (BID) | ORAL | 0 refills | Status: DC

## 2018-01-09 MED ORDER — SODIUM CHLORIDE 0.9 % IV SOLN: INTRAVENOUS | Status: DC

## 2018-01-09 SURGICAL SUPPLY — 15 items

## 2018-01-09 NOTE — Transfer of Care (Signed)
Immediate Anesthesia Transfer of Care Note  Patient: Leon Thomas  Procedure(s) Performed: Procedure(s): ESOPHAGOGASTRODUODENOSCOPY (EGD) WITH PROPOFOL (N/A) BIOPSY  Patient Location: PACU  Anesthesia Type:MAC  Level of Consciousness:  sedated, patient cooperative and responds to stimulation  Airway & Oxygen Therapy:Patient Spontanous Breathing and Patient connected to face mask oxgen  Post-op Assessment:  Report given to PACU RN and Post -op Vital signs reviewed and stable  Post vital signs:  Reviewed and stable  Last Vitals:  Vitals:   01/09/18 0542 01/09/18 0932  BP: 140/76 (!) 162/90  Pulse: (!) 58 (!) 56  Resp: 18 16  Temp: 36.7 C 36.7 C  SpO2: 035% 24%    Complications: No apparent anesthesia complications

## 2018-01-09 NOTE — Progress Notes (Signed)
Pt. seen for CPAP status, had CPAP on just prior to my arrival humidifier filled with s/w, helped adjust nasal mask, tolerating well otherwise, made aware to notify if needed.

## 2018-01-09 NOTE — Anesthesia Preprocedure Evaluation (Signed)
Anesthesia Evaluation  Patient identified by MRN, date of birth, ID band Patient awake    Reviewed: Allergy & Precautions, NPO status , Patient's Chart, lab work & pertinent test results  Airway Mallampati: II  TM Distance: >3 FB Neck ROM: Full    Dental   Pulmonary sleep apnea ,    breath sounds clear to auscultation       Cardiovascular hypertension, Pt. on medications and Pt. on home beta blockers + dysrhythmias  Rhythm:Regular Rate:Normal     Neuro/Psych negative neurological ROS     GI/Hepatic negative GI ROS, Neg liver ROS,   Endo/Other  negative endocrine ROS  Renal/GU negative Renal ROS     Musculoskeletal  (+) Arthritis ,   Abdominal   Peds  Hematology negative hematology ROS (+)   Anesthesia Other Findings   Reproductive/Obstetrics                             Anesthesia Physical Anesthesia Plan  ASA: III  Anesthesia Plan: MAC   Post-op Pain Management:    Induction: Intravenous  PONV Risk Score and Plan: 1 and Propofol infusion and Treatment may vary due to age or medical condition  Airway Management Planned: Natural Airway and Nasal Cannula  Additional Equipment:   Intra-op Plan:   Post-operative Plan:   Informed Consent: I have reviewed the patients History and Physical, chart, labs and discussed the procedure including the risks, benefits and alternatives for the proposed anesthesia with the patient or authorized representative who has indicated his/her understanding and acceptance.     Plan Discussed with: CRNA  Anesthesia Plan Comments:         Anesthesia Quick Evaluation

## 2018-01-09 NOTE — Op Note (Signed)
[facility] Patient Name: Leon Thomas Procedure Date: 01/09/2018 MRN: [id number] Attending MD: Kathi Der , MD Date of Birth: 12-23-1955 CSN: [id number] Age: 62 Admit Type: Inpatient Procedure:                Upper GI endoscopy Indications:              Melena Providers:                Kathi Der, MD, Bonney Leitz, Harrington Challenger,                            Technician Referring MD:              Medicines:                Sedation Administered by an Anesthesia Professional Complications:            No immediate complications. Estimated Blood Loss:     Estimated blood loss: none. Procedure:                Pre-Anesthesia Assessment:                           - Prior to the procedure, a History and Physical                            was performed, and patient medications and                            allergies were reviewed. The patient's tolerance of                            previous anesthesia was also reviewed. The risks                            and benefits of the procedure and the sedation                            options and risks were discussed with the patient.                            All questions were answered, and informed consent                            was obtained. Prior Anticoagulants: The patient has                            taken Xarelto (rivaroxaban), last dose was 2 days                            prior to procedure. ASA Grade Assessment: III - A                            patient with severe systemic disease. After  reviewing the risks and benefits, the patient was                            deemed in satisfactory condition to undergo the                            procedure.                           After obtaining informed consent, the endoscope was                            passed under direct vision. Throughout the                            procedure, the patient's blood pressure, pulse, and                           oxygen saturations were monitored continuously. The                            Endoscope was introduced through the mouth, and                            advanced to the second part of duodenum. The upper                            GI endoscopy was accomplished without difficulty.                            The patient tolerated the procedure well. Scope In: Scope Out: Findings:      The Z-line was regular and was found 39 cm from the incisors.      The exam of the esophagus was otherwise normal.      Diffuse mild inflammation characterized by congestion (edema), erythema       and granularity was found in the entire examined stomach. Biopsies were       taken with a cold forceps for histology.      One non-bleeding superficial duodenal ulcer with no stigmata of bleeding       was found in the duodenal bulb.      The first portion of the duodenum and second portion of the duodenum       were normal. Impression:               - Z-line regular, 39 cm from the incisors.                           - Chronic gastritis. Biopsied.                           - One non-bleeding duodenal ulcer with no stigmata                            of bleeding.                           -  Normal first portion of the duodenum and second                            portion of the duodenum. Moderate Sedation:      Moderate (conscious) sedation was personally administered by an       anesthesia professional. The following parameters were monitored: oxygen       saturation, heart rate, blood pressure, and response to care. Recommendation:           - Return patient to hospital ward for ongoing care.                           - Cardiac diet.                           - Continue present medications.                           - Await pathology results.                           - Repeat upper endoscopy in 2 months to check                            healing.                           - Return  to my office in 6 weeks. Procedure Code(s):        --- Professional ---                           [id number], Esophagogastroduodenoscopy, flexible,                            transoral; with biopsy, single or multiple Diagnosis Code(s):        --- Professional ---                           K29.50, Unspecified chronic gastritis without                            bleeding                           K26.9, Duodenal ulcer, unspecified as acute or                            chronic, without hemorrhage or perforation                           K92.1, Melena (includes Hematochezia) CPT copyright 2017 American Medical Association. All rights reserved. The codes documented in this report are preliminary and upon coder review may  be revised to meet current compliance requirements. Kathi Der, MD Kathi Der, MD 01/09/2018 10:42:36 AM Number of Addenda: 0

## 2018-01-09 NOTE — Interval H&P Note (Signed)
History and Physical Interval Note:  01/09/2018 10:04 AM  Leon HalonJimmy R Tuller  has presented today for surgery, with the diagnosis of Melena  The various methods of treatment have been discussed with the patient and family. After consideration of risks, benefits and other options for treatment, the patient has consented to  Procedure(s): ESOPHAGOGASTRODUODENOSCOPY (EGD) WITH PROPOFOL (N/A) as a surgical intervention .  The patient's history has been reviewed, patient examined, no change in status, stable for surgery.  I have reviewed the patient's chart and labs.  Questions were answered to the patient's satisfaction.    Risks (bleeding, infection, bowel perforation that could require surgery, sedation-related changes in cardiopulmonary systems), benefits (identification and possible treatment of source of symptoms, exclusion of certain causes of symptoms), and alternatives (watchful waiting, radiographic imaging studies, empiric medical treatment)  were explained to patient in detail and patient wishes to proceed. Amiya Escamilla

## 2018-01-09 NOTE — Anesthesia Postprocedure Evaluation (Signed)
Anesthesia Post Note  Patient: Leon Thomas  Procedure(s) Performed: ESOPHAGOGASTRODUODENOSCOPY (EGD) WITH PROPOFOL (N/A ) BIOPSY     Patient location during evaluation: PACU Anesthesia Type: MAC Level of consciousness: awake and alert Pain management: pain level controlled Vital Signs Assessment: post-procedure vital signs reviewed and stable Respiratory status: spontaneous breathing, nonlabored ventilation, respiratory function stable and patient connected to nasal cannula oxygen Cardiovascular status: stable and blood pressure returned to baseline Postop Assessment: no apparent nausea or vomiting Anesthetic complications: no    Last Vitals:  Vitals:   01/09/18 1050 01/09/18 1116  BP: (!) 119/95 (!) 138/96  Pulse: 71 100  Resp: 18   Temp:  36.4 C  SpO2: 98% 100%    Last Pain:  Vitals:   01/09/18 1116  TempSrc: Oral  PainSc:                  Tiajuana Amass

## 2018-01-09 NOTE — Brief Op Note (Signed)
01/07/2018 - 01/09/2018  10:44 AM  PATIENT:  Leon Thomas  62 y.o. male  PRE-OPERATIVE DIAGNOSIS:  Melena  POST-OPERATIVE DIAGNOSIS:  duodenal ulcer; gastritis, gastric biopsies  PROCEDURE:  Procedure(s): ESOPHAGOGASTRODUODENOSCOPY (EGD) WITH PROPOFOL (N/A) BIOPSY  SURGEON:  Surgeon(s) and Role:    * Murl Zogg, MD - Primary  Findings ----------- - EGD showed medium size clean base duodenal bulb ulcer. No evidence of active bleeding. Inflammation in the duodenal bulb also noted. Diffuse gastritis. Biopsies were performed.  Recommendations --------------------------- - Okay to start cardiac diet. - Patient was advised to avoid NSAIDs. - Okay to resume Xarelto  from GI standpoint. - He needs to be on twice a day PPI at least for 8 weeks followed by once a day PPI. Recommend repeat EGD to document healing in 2-3 months. - Follow-up in GI clinic in 6 weeks. - Okay to discharge from GI standpoint. GI will sign off. Call us back if needed  Kathi DerParag Anela Bensman MD, FACP 01/09/2018, 10:48 AM  Contact #  [phone]

## 2018-01-09 NOTE — Discharge Summary (Signed)
Discharge Summary  Leon Thomas ZOX:[id number] DOB: 02-Aug-1955  PCP: Doristine BosworthStallings, Zoe A, MD  Admit date: 01/07/2018 Discharge date: 01/09/2018  Time spent: 25 minutes  Recommendations for Outpatient Follow-up:  1. Follow-up with GI 2. Follow-up with PCP 3. Take your medications as prescribed 4. Avoid NSAIDs 5. Please follow recommendations per GI listed below.  Discharge Diagnoses:  Active Hospital Problems   Diagnosis Date Noted  . GI bleed 01/07/2018  . Primary osteoarthritis of right knee 12/12/2017  . Atrial fibrillation (HCC) 05/13/2017  . Gout, arthritis 05/13/2017  . Morbid obesity (HCC) 05/13/2017  . Chronic anticoagulation 05/13/2017  . OSA (obstructive sleep apnea) 06/20/2016    Resolved Hospital Problems  No resolved problems to display.    Discharge Condition: Stable  Diet recommendation: Resume previous diet  Vitals:   01/09/18 1050 01/09/18 1116  BP: (!) 119/95 (!) 138/96  Pulse: 71 100  Resp: 18   Temp:  97.6 F (36.4 C)  SpO2: 98% 100%    History of present illness:  Leon HalonJimmy R Kirkwood is a 62 y.o. male with medical history significant for OSA, P. atrial fibrillation on anticoagulation, chronic pain right knee, HTN, gout, obesity. Patient presented with complaints of black tarry stools started 3 days ago Sunday. Patient reports intermittent abdominal pain mostly LUQ, over the past 3 weeks, described as a "gas pain". Reports almost daueikly NSAID use , 1-2 tablets over the past several weeks for chronic right knee pain which he says needs replacement.  He denies vomiting.  Patient is on Xarelto for atrial fibrillation and compliant.   01/09/2018: Patient seen and examined at his bedside.  He has no new complaints.  EGD done and revealed medium sized duodenal ulcer with clean base and no evidence of active bleeding, and diffuse gastritis.  On the day of discharge, the patient was hemodynamically stable.  He will need to follow-up with GI and PCP post  hospitalization.  Recommendations per GI: - Okay to start cardiac diet. - Patient was advised to avoid NSAIDs. - Okay to resume Xarelto  from GI standpoint. - He needs to be on twice a day PPI at least for 8 weeks followed by once a day PPI. Recommend repeat EGD to document healing in 2-3 months. - Follow-up in GI clinic in 6 weeks. - Okay to discharge from GI standpoint.    Hospital Course:  Active Problems:   OSA (obstructive sleep apnea)   Atrial fibrillation (HCC)   Gout, arthritis   Morbid obesity (HCC)   Chronic anticoagulation   Primary osteoarthritis of right knee   GI bleed  Upper GI bleed secondary to diffuse gastritis, duodenal ulcer  EGD done on 01/09/2018 which revealed Medium size clean base duodenal ulcer with no evidence of active bleeding and gastritis diffusely. Follow-up with GI post hospitalization Continue PPI use as recommended by GI Restart Xarelto as recommended by GI Resume diet as recommended by GI Follow-up with your PCP post hospitalization to monitor hemoglobin on Monday, 01/13/2018.  Morbid obesity BMI 48 Recommend weight loss outpatient  P. A. fib on Xarelto Resume Xarelto Rate controlled  Hypertension Continue home meds  OSA Continue CPAP at night  Procedures:  EGD on 01/09/2018  Consultations:  GI  Discharge Exam: BP (!) 138/96 (BP Location: Right Arm)   Pulse 100   Temp 97.6 F (36.4 C) (Oral)   Resp 18   Ht 6\' 2"  (1.88 m)   Wt (!) 171.5 kg (378 lb)   SpO2 100%  BMI 48.53 kg/m  . General: 62 y.o. year-old male well developed well nourished in no acute distress.  Alert and oriented x3. . Cardiovascular: Regular rate and rhythm with no rubs or gallops.  No thyromegaly or JVD noted.   Marland Kitchen Respiratory: Clear to auscultation with no wheezes or rales. Good inspiratory effort. . Abdomen: Soft nontender nondistended with normal bowel sounds x4 quadrants. . Musculoskeletal: No lower extremity edema. 2/4 pulses in all 4  extremities. . Skin: No ulcerative lesions noted or rashes, . Psychiatry: Mood is appropriate for condition and setting  Discharge Instructions You were cared for by a hospitalist during your hospital stay. If you have any questions about your discharge medications or the care you received while you were in the hospital after you are discharged, you can call the unit and asked to speak with the hospitalist on call if the hospitalist that took care of you is not available. Once you are discharged, your primary care physician will handle any further medical issues. Please note that NO REFILLS for any discharge medications will be authorized once you are discharged, as it is imperative that you return to your primary care physician (or establish a relationship with a primary care physician if you do not have one) for your aftercare needs so that they can reassess your need for medications and monitor your lab values.   Allergies as of 01/09/2018   No Known Allergies     Medication List    STOP taking these medications   HYDROcodone-acetaminophen 10-325 MG tablet Commonly known as:  NORCO     TAKE these medications   allopurinol 300 MG tablet Commonly known as:  ZYLOPRIM Take 1 tablet (300 mg total) by mouth daily.   carvedilol 25 MG tablet Commonly known as:  COREG TAKE 2 TABLETS BY MOUTH DAILY   cloNIDine 0.2 mg/24hr patch Commonly known as:  CATAPRES - Dosed in mg/24 hr Place 1 patch (0.2 mg total) onto the skin once a week. What changed:    when to take this  additional instructions   eplerenone 25 MG tablet Commonly known as:  INSPRA Take 1 tablet (25 mg total) by mouth daily.   furosemide 40 MG tablet Commonly known as:  LASIX TAKE 1 TABLET BY MOUTH EVERY DAY   olmesartan 40 MG tablet Commonly known as:  BENICAR Take 40 mg by mouth daily.   omega-3 acid ethyl esters 1 g capsule Commonly known as:  LOVAZA Take 1 g by mouth daily.   pantoprazole 40 MG  tablet Commonly known as:  PROTONIX Take 1 tablet (40 mg total) by mouth 2 (two) times daily.   polyethylene glycol packet Commonly known as:  MIRALAX / GLYCOLAX Take 17 g by mouth daily as needed. For constipation   XARELTO 20 MG Tabs tablet Generic drug:  rivaroxaban Take 20 mg by mouth daily.      No Known Allergies Follow-up Information    Brahmbhatt, Parag, MD. Schedule an appointment as soon as possible for a visit in 6 week(s).   Specialty:  Gastroenterology Why:  follow-up for GI bleed. He will need a repeat EGD. Contact information: [address] [place] [address] [phone]        Doristine Bosworth, MD. Call in 1 day(s).   Specialty:  Internal Medicine Why:  please call for an appointment. Contact information: [address] [place] [address] [phone]            The results of  significant diagnostics from this hospitalization (including imaging, microbiology, ancillary and laboratory) are listed below for reference.    Significant Diagnostic Studies: No results found.  Microbiology: No results found for this or any previous visit (from the past 240 hour(s)).   Labs: Basic Metabolic Panel: Recent Labs  Lab 01/07/18 1431 01/09/18 0427  NA 138 137  K 4.0 3.9  CL 103 103  CO2 28 28  GLUCOSE 99 106*  BUN 19 11  CREATININE 1.10 1.04  CALCIUM 9.3 9.0   Liver Function Tests: Recent Labs  Lab 01/07/18 1431  AST 16  ALT 13*  ALKPHOS 47  BILITOT 1.0  PROT 7.7  ALBUMIN 4.0   No results for input(s): LIPASE, AMYLASE in the last 168 hours. No results for input(s): AMMONIA in the last 168 hours. CBC: Recent Labs  Lab 01/07/18 1431 01/08/18 0100 01/08/18 0739 01/08/18 1733 01/09/18 0427  WBC 5.0  --  4.8 4.6 4.9  HGB 13.6 12.0* 12.2* 12.6* 12.5*  HCT 39.8 35.9* 36.0* 36.5* 36.4*  MCV 90.5  --  90.9 91.0 90.3  PLT 153  --  139* 143* 134*   Cardiac Enzymes: No results for input(s): CKTOTAL, CKMB, CKMBINDEX,  TROPONINI in the last 168 hours. BNP: BNP (last 3 results) No results for input(s): BNP in the last 8760 hours.  ProBNP (last 3 results) No results for input(s): PROBNP in the last 8760 hours.  CBG: No results for input(s): GLUCAP in the last 168 hours.     Signed:  Darlin Drop, MD [facility] 01/09/2018, 3:46 PM

## 2018-01-09 NOTE — Discharge Instructions (Signed)
Gastrointestinal Bleeding °Gastrointestinal bleeding is bleeding somewhere along the path food travels through the body (digestive tract). This path is anywhere between the mouth and the opening of the butt (anus). You may have blood in your poop (stools) or have black poop. If you throw up (vomit), there may be blood in it. °This condition can be mild, serious, or even life-threatening. If you have a lot of bleeding, you may need to stay in the hospital. °Follow these instructions at home: °· Take over-the-counter and prescription medicines only as told by your doctor. °· Eat foods that have a lot of fiber in them. These foods include whole grains, fruits, and vegetables. You can also try eating 1-3 prunes each day. °· Drink enough fluid to keep your pee (urine) clear or pale yellow. °· Keep all follow-up visits as told by your doctor. This is important. °Contact a doctor if: °· Your symptoms do not get better. °Get help right away if: °· Your bleeding gets worse. °· You feel dizzy or you pass out (faint). °· You feel weak. °· You have very bad cramps in your back or belly (abdomen). °· You pass large clumps of blood (clots) in your poop. °· Your symptoms are getting worse. °This information is not intended to replace advice given to you by your health care provider. Make sure you discuss any questions you have with your health care provider. °Document Released: 04/24/2008 Document Revised: 12/22/2015 Document Reviewed: 01/03/2015 °Elsevier Interactive Patient Education © 2018 [employer]. ° °

## 2018-01-10 ENCOUNTER — Encounter (HOSPITAL_COMMUNITY): Payer: Self-pay | Admitting: Gastroenterology

## 2018-01-27 ENCOUNTER — Other Ambulatory Visit (HOSPITAL_COMMUNITY): Payer: Self-pay | Admitting: Physician Assistant

## 2018-01-27 DIAGNOSIS — M1711 Unilateral primary osteoarthritis, right knee: Secondary | ICD-10-CM

## 2018-01-28 ENCOUNTER — Encounter: Payer: Self-pay | Admitting: Family Medicine

## 2018-01-28 ENCOUNTER — Other Ambulatory Visit: Payer: Self-pay

## 2018-01-28 ENCOUNTER — Encounter (HOSPITAL_COMMUNITY): Payer: Self-pay | Admitting: Emergency Medicine

## 2018-01-28 ENCOUNTER — Emergency Department (HOSPITAL_COMMUNITY)
Admission: EM | Admit: 2018-01-28 | Discharge: 2018-01-28 | Disposition: A | Discharge status: Home / Self Care | Payer: Medicare Other | Attending: Emergency Medicine | Admitting: Emergency Medicine

## 2018-01-28 ENCOUNTER — Emergency Department (HOSPITAL_COMMUNITY): Payer: Medicare Other

## 2018-01-28 DIAGNOSIS — M25511 Pain in right shoulder: Secondary | ICD-10-CM | POA: Diagnosis present

## 2018-01-28 DIAGNOSIS — Z5321 Procedure and treatment not carried out due to patient leaving prior to being seen by health care provider: Secondary | ICD-10-CM | POA: Diagnosis not present

## 2018-01-28 DIAGNOSIS — K2950 Unspecified chronic gastritis without bleeding: Secondary | ICD-10-CM | POA: Insufficient documentation

## 2018-01-28 MED ORDER — SODIUM CHLORIDE 0.9 % IV BOLUS: 1000.0000 mL | Freq: Once | INTRAVENOUS | Status: DC

## 2018-01-28 MED ORDER — DEXAMETHASONE SODIUM PHOSPHATE 10 MG/ML IJ SOLN: 10.0000 mg | Freq: Once | INTRAMUSCULAR | Status: DC

## 2018-01-28 NOTE — ED Triage Notes (Signed)
Pt arriving with right shoulder pain. Pt states he fell in January and was supposed to be set up for an MRI and possible surgery.

## 2018-01-28 NOTE — ED Notes (Signed)
Pt called from the lobby with no response x2 

## 2018-01-28 NOTE — ED Notes (Signed)
Pt called from the lobby with no response 

## 2018-01-29 NOTE — ED Notes (Signed)
Follow up call made  Has mri scheduled   01/29/18  1107 s Azayla Polo rn

## 2018-02-19 ENCOUNTER — Other Ambulatory Visit: Payer: Self-pay | Admitting: Orthopedic Surgery

## 2018-02-20 ENCOUNTER — Other Ambulatory Visit: Payer: Self-pay

## 2018-02-20 ENCOUNTER — Encounter (HOSPITAL_COMMUNITY): Payer: Self-pay

## 2018-02-20 ENCOUNTER — Encounter (HOSPITAL_COMMUNITY)
Admission: RE | Admit: 2018-02-20 | Discharge: 2018-02-20 | Disposition: A | Discharge status: Home / Self Care | Payer: Medicare Other | Source: Ambulatory Visit | Attending: Orthopedic Surgery | Admitting: Orthopedic Surgery

## 2018-02-20 DIAGNOSIS — M109 Gout, unspecified: Secondary | ICD-10-CM | POA: Diagnosis not present

## 2018-02-20 DIAGNOSIS — I429 Cardiomyopathy, unspecified: Secondary | ICD-10-CM | POA: Insufficient documentation

## 2018-02-20 DIAGNOSIS — G4733 Obstructive sleep apnea (adult) (pediatric): Secondary | ICD-10-CM | POA: Diagnosis not present

## 2018-02-20 DIAGNOSIS — I10 Essential (primary) hypertension: Secondary | ICD-10-CM | POA: Diagnosis not present

## 2018-02-20 DIAGNOSIS — Z79899 Other long term (current) drug therapy: Secondary | ICD-10-CM | POA: Diagnosis not present

## 2018-02-20 DIAGNOSIS — I480 Paroxysmal atrial fibrillation: Secondary | ICD-10-CM | POA: Diagnosis not present

## 2018-02-20 DIAGNOSIS — M75101 Unspecified rotator cuff tear or rupture of right shoulder, not specified as traumatic: Secondary | ICD-10-CM | POA: Insufficient documentation

## 2018-02-20 DIAGNOSIS — Z7901 Long term (current) use of anticoagulants: Secondary | ICD-10-CM | POA: Diagnosis not present

## 2018-02-20 DIAGNOSIS — Z01812 Encounter for preprocedural laboratory examination: Secondary | ICD-10-CM | POA: Diagnosis present

## 2018-02-20 HISTORY — DX: Gastrointestinal hemorrhage, unspecified: K92.2

## 2018-02-20 HISTORY — DX: Heart failure, unspecified: I50.9

## 2018-02-20 HISTORY — DX: Unspecified rotator cuff tear or rupture of right shoulder, not specified as traumatic: M75.101

## 2018-02-20 LAB — BASIC METABOLIC PANEL
Anion gap: 11 (ref 5–15)
BUN: 17 mg/dL (ref 6–20)
CO2: 27 mmol/L (ref 22–32)
Calcium: 9.2 mg/dL (ref 8.9–10.3)
Chloride: 101 mmol/L (ref 98–111)
Creatinine, Ser: 1.21 mg/dL (ref 0.61–1.24)
GFR calc Af Amer: >60 mL/min (ref >60)
GLUCOSE: 95 mg/dL (ref 70–99)
Potassium: 4.1 mmol/L (ref 3.5–5.1)
Sodium: 139 mmol/L (ref 135–145)

## 2018-02-20 LAB — CBC
HEMATOCRIT: 43.4 % (ref 39.0–52.0)
Hemoglobin: 14.2 g/dL (ref 13.0–17.0)
MCH: 29.2 pg (ref 26.0–34.0)
MCHC: 32.7 g/dL (ref 30.0–36.0)
MCV: 89.1 fL (ref 78.0–100.0)
Platelets: 174 10*3/uL (ref 150–400)
RBC: 4.87 MIL/uL (ref 4.22–5.81)
RDW: 12.9 % (ref 11.5–15.5)
WBC: 5.9 10*3/uL (ref 4.0–10.5)

## 2018-02-20 NOTE — Progress Notes (Signed)
Pt denies SOB and chest pain. Pt stated that he is under the care of Dr. Jacinto HalimGanji, Cardiology. Pt denies having a cardiac cath. Pt denies having a chest x ray within the last year. Pt denies recent labs. Pt stated that he was instructed to take his last dose of Xarelto on Friday 02/21/18 by MD.Pt chart forwarded to anesthesia to review cardiac clearance note on chart.

## 2018-02-20 NOTE — Pre-Procedure Instructions (Addendum)
   Anabel HalonJimmy R Lamoreaux  02/20/2018     [id number], [place] - [address] [place] [id number] Phone: [phone] Fax: [phone]   Your procedure is scheduled on Monday, February 24, 2018  [facility] at 11:30 [facility].M.  Call this number if you have problems the morning of surgery:  [phone]   Remember: Follow doctors instructions regarding Cristi LoronXaerelto  Do not eat or drink after midnight Sunday, February 23, 2017  Take these medicines the morning of surgery with A SIP OF WATER: allopurinol (ZYLOPRIM), carvedilol (COREG), eplerenone (INSPRA), pantoprazole (PROTONIX)  Stop taking Aspirin ( unless advised otherwise by your surgeon), vitamins, fish oil and herbal medications. Do not take any NSAIDs ie: Ibuprofen, Advil, Naproxen (Aleve), Motrin, BC and Goody Powder; stop now.   Do not wear jewelry, make-up or nail polish.  Do not wear lotions, powders, or perfumes, or deodorant.  Do not shave 48 hours prior to surgery.  Men may shave face and neck.  Do not bring valuables to the hospital.  [facility] is not responsible for any belongings or valuables.  Contacts, dentures or bridgework may not be worn into surgery.  Leave your suitcase in the car.  After surgery it may be brought to your room Patients discharged the day of surgery will not be allowed to drive home.  Special instructions:Shower the night before and the morning of surgery.  Please read over the following fact sheets that you were given. Pain Booklet, Coughing and Deep Breathing and Surgical Site Infection Prevention

## 2018-02-21 ENCOUNTER — Encounter (HOSPITAL_COMMUNITY): Payer: Self-pay | Admitting: Physician Assistant

## 2018-02-21 NOTE — Progress Notes (Signed)
Anesthesia Chart Review:  Case:  [id number] Date/Time:  02/24/18 1315   Procedure:  RIGHT SHOULDER ARTHROSCOPY WITH ROTATOR CUFF REPAIR AND SUBACROMIAL DECOMPRESSION (Right Shoulder)   Anesthesia type:  Choice   Pre-op diagnosis:  Right Shoulder Rotator Cuff Tear   Location:  [facility] ROOM 05 / [facility]   Surgeon:  [name], MD      DISCUSSION: 62 yo male never smoker for above procedure. Pertinent history includes HTN, Cardiomyopathy without HF, Arthritis, Paroxysmal Afib, Gout, OSA on CPAP, Duodenal ulcer (no active bleeding fer EGD 01/09/18), Gastritis.  He has surgical clearance from Dr. Jacinto Halim on chart dated 02/19/18 stating he is low risk for perioperative CV complication. Stop Xarelto 2d before surgery, restart when safe (2-6 days), he is maintaining sinus rhythm.  Anticipate can proceed with surgery as scheduled barring acute status change.   VS: BP 123/76   Pulse 70   Temp 36.8 C   Resp 20   Ht 6\' 2"  (1.88 m)   Wt (!) 362 lb 8 oz (164.4 kg)   SpO2 97%   BMI 46.54 kg/m   PROVIDERS: Doristine Bosworth, MD is PCP  Delrae Rend is Cardiologist   LABS: Labs reviewed: Acceptable for surgery. (all labs ordered are listed, but only abnormal results are displayed)  Labs Reviewed  BASIC METABOLIC PANEL  CBC     IMAGES: CHEST - 2 VIEW 11/29/2010  Comparison: 02/09/2007  Findings: Normal heart size and vascularity.  Negative for pneumonia, edema, effusion or pneumothorax.  Midline trachea. Thoracic spondylosis evident.  IMPRESSION: No acute chest finding   EKG: 01/07/2018: Sinus rhythm. Prolonged PR interval. Anteroseptal infarct, old  04/21/2013: Sinus bradycardia with 1st degree A-V block. Possible Left atrial enlargement. Cannot rule out Septal infarct , age undetermined  CV: Nuclear Stress 02/13/2013 (outside record, copy on pt chart): 1.  Resting EKG showed atrial fibrillation, poor R wave progression, nonspecific ST depression and T inversion in inferior  leads, cannot rule out ischemia.  Stress EKG was equivocal for ischemia.  There was 1 mm additional ST depression noted at peak exercise which resolved at less than 2 minutes into recovery.  Patient exercised on Bruce protocol for 5 minutes and 2 seconds.  Maximum workload achieved was 6.8 minutes.  The test was terminated due to achievement of the target heart rate. 2.  Perfusion imaging study demonstrates prominent diaphragmatic attenuation and mild uptake artifact in the inferior, inferoapical and apical anterior wall with no statistically significant ischemia demonstrable bipolar images.  Dynamic getting images reveal normal wall motion suggesting that this is probably soft tissue attenuation and scar.  Ejection fraction 53%.  This is a low risk study.  Echo 02/09/13 (outside record, copy on pt chart): 1.  Left ventricular cavity is normal in size.  Moderate to severe concentric hypertrophy.  Normal global wall motion.  Moderately decreased systolic function.  Calculated EF 43%.  Visual EF of 35 to 40%. 2.  Left atrial cavity moderately dilated. 3.  Mitral valve structurally normal.  Trace mitral regurgitation. 4.  Tricuspid valve structurally normal.  Trace tricuspid regurgitation.  Past Medical History:  Diagnosis Date  . Arthritis    knees  . CHF (congestive heart failure) (HCC)     " A long time ago"  . Dysrhythmia    04-21-13 Cardioverted at [facility] for A. Fib-Dr. Jacinto Halim. No further problems  . GI bleed   . Gout   . Hypertension   . Rotator cuff tear, right   .  Sleep apnea    cpap nightly, settings 3    Past Surgical History:  Procedure Laterality Date  . BIOPSY  01/09/2018   Procedure: BIOPSY;  Surgeon: Kathi DerBrahmbhatt, Parag, MD;  [facility];  Service: Gastroenterology;;  . CARDIOVERSION N/A 04/21/2013   Procedure: CARDIOVERSION;  Surgeon: Pamella PertJagadeesh R Ganji, MD;  [facility];  Service: Cardiovascular;  Laterality: N/A;  h&p in file- HW  . COLONOSCOPY WITH  PROPOFOL N/A 09/04/2013   Procedure: COLONOSCOPY WITH PROPOFOL;  Surgeon: Vertell NovakJames L Edwards Jr., MD;  Location: [facility];  Service: Endoscopy;  Laterality: N/A;  . ESOPHAGOGASTRODUODENOSCOPY (EGD) WITH PROPOFOL N/A 01/09/2018   Procedure: ESOPHAGOGASTRODUODENOSCOPY (EGD) WITH PROPOFOL;  Surgeon: Kathi DerBrahmbhatt, Parag, MD;  Location: [facility];  Service: Gastroenterology;  Laterality: N/A;  . FOOT SURGERY Left   . JOINT REPLACEMENT Left   . ORIF ANKLE FRACTURE Right     MEDICATIONS: . allopurinol (ZYLOPRIM) 300 MG tablet  . carvedilol (COREG) 25 MG tablet  . cloNIDine (CATAPRES - DOSED IN MG/24 HR) 0.2 mg/24hr patch  . docusate sodium (COLACE) 100 MG capsule  . eplerenone (INSPRA) 25 MG tablet  . furosemide (LASIX) 40 MG tablet  . olmesartan (BENICAR) 40 MG tablet  . Omega-3 Fatty Acids (FISH OIL) 1000 MG CAPS  . pantoprazole (PROTONIX) 40 MG tablet  . Rivaroxaban (XARELTO) 20 MG TABS tablet   No current facility-administered medications for this encounter.     Zannie CoveJames Agnes Brightbill, PA-C [facility]iology Phone [phone] 02/21/2018 9:17 AM

## 2018-02-24 ENCOUNTER — Ambulatory Visit (HOSPITAL_COMMUNITY): Admission: RE | Admit: 2018-02-24 | Payer: Medicare Other | Source: Ambulatory Visit | Admitting: Orthopedic Surgery

## 2018-02-24 ENCOUNTER — Other Ambulatory Visit: Payer: Self-pay | Admitting: Orthopedic Surgery

## 2018-02-24 ENCOUNTER — Encounter (HOSPITAL_COMMUNITY): Admission: RE | Payer: Self-pay | Source: Ambulatory Visit

## 2018-02-24 SURGERY — SHOULDER ARTHROSCOPY WITH ROTATOR CUFF REPAIR AND SUBACROMIAL DECOMPRESSION
Anesthesia: Choice | Site: Shoulder | Laterality: Right

## 2018-03-03 ENCOUNTER — Other Ambulatory Visit: Payer: Self-pay | Admitting: Family Medicine

## 2018-03-17 ENCOUNTER — Encounter: Payer: Self-pay | Admitting: Family Medicine

## 2018-03-17 ENCOUNTER — Ambulatory Visit (INDEPENDENT_AMBULATORY_CARE_PROVIDER_SITE_OTHER): Payer: Medicare Other | Admitting: Family Medicine

## 2018-03-17 ENCOUNTER — Other Ambulatory Visit: Payer: Self-pay

## 2018-03-17 VITALS — BP 144/88 | HR 59 | Temp 98.7°F | Resp 17 | Ht 74.0 in | Wt 362.2 lb

## 2018-03-17 DIAGNOSIS — G4733 Obstructive sleep apnea (adult) (pediatric): Secondary | ICD-10-CM

## 2018-03-17 DIAGNOSIS — M67911 Unspecified disorder of synovium and tendon, right shoulder: Secondary | ICD-10-CM

## 2018-03-17 DIAGNOSIS — M1711 Unilateral primary osteoarthritis, right knee: Secondary | ICD-10-CM | POA: Diagnosis not present

## 2018-03-17 DIAGNOSIS — Z7901 Long term (current) use of anticoagulants: Secondary | ICD-10-CM

## 2018-03-17 DIAGNOSIS — I10 Essential (primary) hypertension: Secondary | ICD-10-CM

## 2018-03-17 DIAGNOSIS — K219 Gastro-esophageal reflux disease without esophagitis: Secondary | ICD-10-CM

## 2018-03-17 DIAGNOSIS — M1A3720 Chronic gout due to renal impairment, left ankle and foot, without tophus (tophi): Secondary | ICD-10-CM

## 2018-03-17 DIAGNOSIS — N182 Chronic kidney disease, stage 2 (mild): Secondary | ICD-10-CM

## 2018-03-17 MED ORDER — FUROSEMIDE 40 MG PO TABS: ORAL_TABLET | ORAL | 0 refills | Status: DC

## 2018-03-17 MED ORDER — HYDROCODONE-ACETAMINOPHEN 10-325 MG PO TABS: 1.0000 | ORAL_TABLET | Freq: Four times a day (QID) | ORAL | 0 refills | Status: DC | PRN

## 2018-03-17 MED ORDER — ALLOPURINOL 300 MG PO TABS: 300.0000 mg | ORAL_TABLET | Freq: Every day | ORAL | 1 refills | Status: DC

## 2018-03-17 MED ORDER — DOCUSATE SODIUM 100 MG PO CAPS: 100.0000 mg | ORAL_CAPSULE | Freq: Every day | ORAL | 1 refills | Status: DC | PRN

## 2018-03-17 MED ORDER — CARVEDILOL 25 MG PO TABS: 50.0000 mg | ORAL_TABLET | Freq: Every day | ORAL | 1 refills | Status: DC

## 2018-03-17 MED ORDER — PANTOPRAZOLE SODIUM 40 MG PO TBEC: 40.0000 mg | DELAYED_RELEASE_TABLET | Freq: Two times a day (BID) | ORAL | 1 refills | Status: DC

## 2018-03-17 NOTE — Progress Notes (Signed)
Chief Complaint  Patient presents with  . 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  He is getting rotator cuff repair  He reports that his opiate medications were not touching his pain and his blood pressure was so high He states that he responded to injection July 2019  Morbid Obesity He reports that he has to lose weight before Orthopedics will repair the knee Wt Readings from Last 3 Encounters:  03/17/18 (!) 362 lb 3.2 oz (164.3 kg)  02/20/18 (!) 362 lb 8 oz (164.4 kg)  01/28/18 (!) 378 lb (171.5 kg)  12/12/2017         383 lb 12.8 oz  He is losing weight by eating fresh and keeping up with his calories He is not eating meat which is helping his weight loss He eats cereal He is eating more fruits   Hypertension He is out of his coreg and needs a refill. He states that even raising his arm to get his bp checked or to put clothes on puts him in excruciating pain.  He denies chest pains, palpitations, sob, lower extremity edema He has not had to take his furosemide since the swelling in his feet or ankles are better He is compliant with his meds He is avoiding salty foods  He is compliant with his CPAP for his OSA BP Readings from Last 3 Encounters:  03/17/18 (!) 144/88  02/20/18 123/76  01/09/18 (!) 138/96    GERD  He burps a lot since running out of protonix He reports that he was taking protonix bid but ran out of his medications He denies epigastric pain or vomiting His symptoms have improved since he stopped eating meat   Gout Pt has a history of gout that would affect mulitple joints As well as chronic arthritis He states that since he stopped eating meat he notices less gout pain and swelling He needs a refill of his allopurinol He denies side effects of allopurinol  Past Medical History:  Diagnosis Date  . Arthritis    knees  .  CHF (congestive heart failure) (HCC)     " A long time ago"  . Dysrhythmia    04-21-13 [facility]Dr. Jacinto HalimGanji. No further problems  . GI bleed   . Gout   . Hypertension   . Rotator cuff tear, right   . Sleep apnea    cpap nightly, settings 3    Current Outpatient Medications  Medication Sig Dispense Refill  . allopurinol (ZYLOPRIM) 300 MG tablet Take 1 tablet (300 mg total) by mouth daily. 90 tablet 1  . carvedilol (COREG) 25 MG tablet Take 2 tablets (50 mg total) by mouth daily. 180 tablet 1  . cloNIDine (CATAPRES - DOSED IN MG/24 HR) 0.2 mg/24hr patch PLACE 1 PATCH (0.2 MG TOTAL) ONTO THE SKIN ONCE A WEEK. 12 patch 1  . docusate sodium (COLACE) 100 MG capsule Take 1 capsule (100 mg total) by mouth daily as needed for mild constipation. 60 capsule 1  . eplerenone (INSPRA) 25 MG tablet Take 1 tablet (25 mg total) by mouth daily. 90 tablet 1  . furosemide (LASIX) 40 MG tablet Take 40 mg by mouth daily as needed for fluid 90 tablet 0  . olmesartan (BENICAR) 40 MG tablet Take 40 mg by mouth daily.    .Marland Kitchen  pantoprazole (PROTONIX) 40 MG tablet Take 1 tablet (40 mg total) by mouth 2 (two) times daily. 180 tablet 1  . Rivaroxaban (XARELTO) 20 MG TABS tablet Take 20 mg by mouth daily.     Marland Kitchen. HYDROcodone-acetaminophen (NORCO) 10-325 MG tablet Take 1 tablet by mouth every 6 (six) hours as needed. 180 tablet 0  . Omega-3 Fatty Acids (FISH OIL) 1000 MG CAPS Take 1,000 mg by mouth daily.     No current facility-administered medications for this visit.     Allergies: No Known Allergies  Past Surgical History:  Procedure Laterality Date  . BIOPSY  01/09/2018   Procedure: BIOPSY;  Surgeon: Kathi DerBrahmbhatt, Parag, MD;  [facility];  Service: Gastroenterology;;  . CARDIOVERSION N/A 04/21/2013   Procedure: CARDIOVERSION;  Surgeon: Pamella PertJagadeesh R Ganji, MD;  [facility];  Service: Cardiovascular;  Laterality: N/A;  h&p in file- HW  . COLONOSCOPY WITH PROPOFOL N/A  09/04/2013   Procedure: COLONOSCOPY WITH PROPOFOL;  Surgeon: Vertell NovakJames L Edwards Jr., MD;  Location: [facility];  Service: Endoscopy;  Laterality: N/A;  . ESOPHAGOGASTRODUODENOSCOPY (EGD) WITH PROPOFOL N/A 01/09/2018   Procedure: ESOPHAGOGASTRODUODENOSCOPY (EGD) WITH PROPOFOL;  Surgeon: Kathi DerBrahmbhatt, Parag, MD;  Location: [facility];  Service: Gastroenterology;  Laterality: N/A;  . FOOT SURGERY Left   . JOINT REPLACEMENT Left   . ORIF ANKLE FRACTURE Right     Social History   Socioeconomic History  . Marital status: Single    Spouse name: Not on file  . Number of children: Not on file  . Years of education: Not on file  . Highest education level: 11th grade  Occupational History  . Not on file  Social Needs  . Financial resource strain: Very hard  . Food insecurity:    Worry: Often true    Inability: Often true  . Transportation needs:    Medical: No    Non-medical: No  Tobacco Use  . Smoking status: Never Smoker  . Smokeless tobacco: Never Used  Substance and Sexual Activity  . Alcohol use: Yes    Alcohol/week: 1.0 standard drinks    Types: 1 Cans of beer per week    Comment: drink occasional beer  . Drug use: No  . Sexual activity: Not on file  Lifestyle  . Physical activity:    Days per week: 0 days    Minutes per session: 0 min  . Stress: Very much  Relationships  . Social connections:    Talks on phone: More than three times a week    Gets together: More than three times a week    Attends religious service: Never    Active member of club or organization: No    Attends meetings of clubs or organizations: Never    Relationship status: Never married  Other Topics Concern  . Not on file  Social History Narrative  . Not on file    Family History  Problem Relation Age of Onset  . Diabetes Mother   . Alzheimer's disease Mother   . Diabetes Father   . Diabetes Brother   . Stroke Sister      ROS Review of Systems See HPI Constitution: No fevers or  chills No malaise No diaphoresis Skin: No rash or itching Eyes: no blurry vision, no double vision GU: no dysuria or hematuria Neuro: no dizziness or headaches all others reviewed and negative   Objective: Vitals:   03/17/18 0843  BP: (!) 144/88  Pulse: (!) 59  Resp: 17  Temp: 98.7  F (37.1 C)  TempSrc: Oral  SpO2: 96%  Weight: (!) 362 lb 3.2 oz (164.3 kg)  Height: 6\' 2"  (1.88 m)    Physical Exam  Constitutional: He is oriented to person, place, and time. He appears well-developed and well-nourished.  Obese male  HENT:  Head: Normocephalic and atraumatic.  Eyes: Conjunctivae and EOM are normal.  Neck: Normal range of motion. Neck supple.  Cardiovascular: Normal rate, regular rhythm and normal heart sounds.  No murmur heard. Pulmonary/Chest: Effort normal and breath sounds normal. No stridor. No respiratory distress.  Abdominal: Soft. Bowel sounds are normal. He exhibits no distension. There is no tenderness. There is no guarding.  Musculoskeletal: Normal range of motion.  No edema of the ankles  Neurological: He is alert and oriented to person, place, and time.  Skin: Skin is warm. Capillary refill takes less than 2 seconds.  Psychiatric: He has a normal mood and affect. His behavior is normal. Judgment and thought content normal.      Component     Latest Ref Rng & Units 02/20/2018  Sodium     135 - 145 mmol/L 139  Potassium     3.5 - 5.1 mmol/L 4.1  Chloride     98 - 111 mmol/L 101  CO2     22 - 32 mmol/L 27  Glucose     70 - 99 mg/dL 95  BUN     6 - 20 mg/dL 17  Creatinine     1.61 - 1.24 mg/dL 0.96  Calcium     8.9 - 10.3 mg/dL 9.2  GFR, Est Non African American     >60 mL/min >60  GFR, Est African American     >60 mL/min >60  Anion gap     5 - 15 11  WBC     4.0 - 10.5 K/uL 5.9  RBC     4.22 - 5.81 MIL/uL 4.87  Hemoglobin     13.0 - 17.0 g/dL 04.5  HCT     40.9 - 81.1 % 43.4  MCV     78.0 - 100.0 fL 89.1  MCH     26.0 - 34.0 pg 29.2   MCHC     30.0 - 36.0 g/dL 91.4  RDW     78.2 - 95.6 % 12.9  Platelets     150 - 400 K/uL 174   Assessment and Plan Hutchinson was seen today for chronic right knee pain.  Diagnoses and all orders for this visit:  Disorder of right rotator cuff- pt has Orthopedic plan for arthroscopic surgery Discussed that he should discuss PT plan with Orthopedics  Primary osteoarthritis of right knee- will continue Norco for now but will have to wean patient off once he gets his knee surgery  OSA (obstructive sleep apnea)- pt compliant with CPAP, explained that this will lead to good result with his hypertension and obesity  Chronic anticoagulation- continue anticoagulation protocol  -     docusate sodium (COLACE) 100 MG capsule; Take 1 capsule (100 mg total) by mouth daily as needed for mild constipation. -     HYDROcodone-acetaminophen (NORCO) 10-325 MG tablet; Take 1 tablet by mouth every 6 (six) hours as needed.  Morbid obesity (HCC)- improved with dietary modification , pt has lost almost 20 pounds in 4 months  -     carvedilol (COREG) 25 MG tablet; Take 2 tablets (50 mg total) by mouth daily.  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  Gastroesophageal reflux disease without esophagitis -     pantoprazole (PROTONIX) 40 MG tablet; Take 1 tablet (40 mg total) by mouth 2 (two) times daily.  Chronic renal impairment, stage 2 (mild)- last creatinine improved Continue current meds  Lasix prn  -     furosemide (LASIX) 40 MG tablet; Take 40 mg by mouth daily as needed for fluid  Essential hypertension- elevated today but pt in pain and cannot lift his arm for bp check without pain  Continue current meds     PMP aware reviewed and appropriate   Zoe A Creta Levin

## 2018-03-17 NOTE — Patient Instructions (Addendum)
If you have lab work done today you will be contacted with your lab results within the next 2 weeks.  If you have not heard from us then please contact us. The fastest way to get your results is to register for My Chart.   IF you received an x-ray today, you will receive an invoice from [facility]. Please contact [facility] at [phone] with questions or concerns regarding your invoice.   IF you received labwork today, you will receive an invoice from [facility]. Please contact [facility] at [phone] with questions or concerns regarding your invoice.   Our billing staff will not be able to assist you with questions regarding bills from these companies.  You will be contacted with the lab results as soon as they are available. The fastest way to get your results is to activate your My Chart account. Instructions are located on the last page of this paperwork. If you have not heard from us regarding the results in 2 weeks, please contact this office.     Gout Gout is painful swelling that can occur in some of your joints. Gout is a type of arthritis. This condition is caused by having too much uric acid in your body. Uric acid is a chemical that forms when your body breaks down substances called purines. Purines are important for building body proteins. When your body has too much uric acid, sharp crystals can form and build up inside your joints. This causes pain and swelling. Gout attacks can happen quickly and be very painful (acute gout). Over time, the attacks can affect more joints and become more frequent (chronic gout). Gout can also cause uric acid to build up under your skin and inside your kidneys. What are the causes? This condition is caused by too much uric acid in your blood. This can occur because:  Your kidneys do not remove enough uric acid from your blood. This is the most common cause.  Your body makes too much uric acid. This can occur with some  cancers and cancer treatments. It can also occur if your body is breaking down too many red blood cells (hemolytic anemia).  You eat too many foods that are high in purines. These foods include organ meats and some seafood. Alcohol, especially beer, is also high in purines.  A gout attack may be triggered by trauma or stress. What increases the risk? This condition is more likely to develop in people who:  Have a family history of gout.  Are male and middle-aged.  Are male and have gone through menopause.  Are obese.  Frequently drink alcohol, especially beer.  Are dehydrated.  Lose weight too quickly.  Have an organ transplant.  Have lead poisoning.  Take certain medicines, including aspirin, cyclosporine, diuretics, levodopa, and niacin.  Have kidney disease or psoriasis.  What are the signs or symptoms? An attack of acute gout happens quickly. It usually occurs in just one joint. The most common place is the big toe. Attacks often start at night. Other joints that may be affected include joints of the feet, ankle, knee, fingers, wrist, or elbow. Symptoms may include:  Severe pain.  Warmth.  Swelling.  Stiffness.  Tenderness. The affected joint may be very painful to touch.  Shiny, red, or purple skin.  Chills and fever.  Chronic gout may cause symptoms more frequently. More joints may be involved. You may also have white or yellow lumps (tophi) on your hands or feet or  in other areas near your joints. How is this diagnosed? This condition is diagnosed based on your symptoms, medical history, and physical exam. You may have tests, such as:  Blood tests to measure uric acid levels.  Removal of joint fluid with a needle (aspiration) to look for uric acid crystals.  X-rays to look for joint damage.  How is this treated? Treatment for this condition has two phases: treating an acute attack and preventing future attacks. Acute gout treatment may include  medicines to reduce pain and swelling, including:  NSAIDs.  Steroids. These are strong anti-inflammatory medicines that can be taken by mouth (orally) or injected into a joint.  Colchicine. This medicine relieves pain and swelling when it is taken soon after an attack. It can be given orally or through an IV tube.  Preventive treatment may include:  Daily use of smaller doses of NSAIDs or colchicine.  Use of a medicine that reduces uric acid levels in your blood.  Changes to your diet. You may need to see a specialist about healthy eating (dietitian).  Follow these instructions at home: During a Gout Attack  If directed, apply ice to the affected area: ? Put ice in a plastic bag. ? Place a towel between your skin and the bag. ? Leave the ice on for 20 minutes, 2-3 times a day.  Rest the joint as much as possible. If the affected joint is in your leg, you may be given crutches to use.  Raise (elevate) the affected joint above the level of your heart as often as possible.  Drink enough fluids to keep your urine clear or pale yellow.  Take over-the-counter and prescription medicines only as told by your health care provider.  Do not drive or operate heavy machinery while taking prescription pain medicine.  Follow instructions from your health care provider about eating or drinking restrictions.  Return to your normal activities as told by your health care provider. Ask your health care provider what activities are safe for you. Avoiding Future Gout Attacks  Follow a low-purine diet as told by your dietitian or health care provider. Avoid foods and drinks that are high in purines, including liver, kidney, anchovies, asparagus, herring, mushrooms, mussels, and beer.  Limit alcohol intake to no more than 1 drink a day for nonpregnant women and 2 drinks a day for men. One drink equals 12 oz of beer, 5 oz of wine, or 1 oz of hard liquor.  Maintain a healthy weight or lose weight  if you are overweight. If you want to lose weight, talk with your health care provider. It is important that you do not lose weight too quickly.  Start or maintain an exercise program as told by your health care provider.  Drink enough fluids to keep your urine clear or pale yellow.  Take over-the-counter and prescription medicines only as told by your health care provider.  Keep all follow-up visits as told by your health care provider. This is important. Contact a health care provider if:  You have another gout attack.  You continue to have symptoms of a gout attack after10 days of treatment.  You have side effects from your medicines.  You have chills or a fever.  You have burning pain when you urinate.  You have pain in your lower back or belly. Get help right away if:  You have severe or uncontrolled pain.  You cannot urinate. This information is not intended to replace advice given to you by  your health care provider. Make sure you discuss any questions you have with your health care provider. Document Released: 07/13/2000 Document Revised: 12/22/2015 Document Reviewed: 04/28/2015 Elsevier Interactive Patient Education  [employer]2018 [employer]

## 2018-03-18 ENCOUNTER — Other Ambulatory Visit: Payer: Self-pay

## 2018-03-18 ENCOUNTER — Encounter (HOSPITAL_COMMUNITY): Payer: Self-pay | Admitting: *Deleted

## 2018-03-18 NOTE — Progress Notes (Signed)
Spoke with pt for pre-op call. Pt was seen on 02/20/18 for a PAT appt and surgery had to be rescheduled due to his mother being ill. Pt states nothing has changed with his allergies, meds, medical and surgical history. Pt denies any recent chest pain, sob, fever, flu like symptoms. Pt states his last dose of Xarelto was yesterday 03/17/18. Cardiac clearance noted in chart.

## 2018-03-19 ENCOUNTER — Telehealth: Payer: Self-pay | Admitting: Family Medicine

## 2018-03-19 MED ORDER — SODIUM CHLORIDE 0.9 % IV SOLN
1500.0000 mg | INTRAVENOUS | Status: AC
  Administered 2018-03-20: 1500 mg via INTRAVENOUS
  Filled 2018-03-19: qty 1500

## 2018-03-19 MED ORDER — CEFAZOLIN SODIUM 10 G IJ SOLR
3.0000 g | INTRAMUSCULAR | Status: AC
  Administered 2018-03-20: 3 g via INTRAVENOUS
  Filled 2018-03-19: qty 3

## 2018-03-19 NOTE — Telephone Encounter (Signed)
Per pharmacy pt will need prior authorization for 45 day supply of norco.  Insurance only pays for a 30 day supply of #120 tabs not #180.  Please advise and I will contact pt and let him know we will resend and insurance should cover. Dgaddy, CMA

## 2018-03-19 NOTE — Telephone Encounter (Signed)
Copied from CRM [phone]#[id number]. Topic: Quick Communication - See Telephone Encounter >> Mar 19, 2018 12:51 PM Oneal GroutSebastian, Jennifer S wrote: CRM for notification. See Telephone encounter for: 03/19/18. Per pt [facility] on [address] [phone]) has HYDROcodone-acetaminophen (NORCO) 10-325 MG tablet on hold. Patient would like provider to contact pharmacy and see why it is on hold.

## 2018-03-20 ENCOUNTER — Encounter (HOSPITAL_COMMUNITY): Payer: Self-pay | Admitting: Surgery

## 2018-03-20 ENCOUNTER — Encounter (HOSPITAL_COMMUNITY)
Admission: RE | Disposition: A | Discharge status: Home / Self Care | Payer: Self-pay | Source: Ambulatory Visit | Attending: Orthopedic Surgery

## 2018-03-20 ENCOUNTER — Ambulatory Visit (HOSPITAL_COMMUNITY)
Admission: RE | Admit: 2018-03-20 | Discharge: 2018-03-20 | Disposition: A | Discharge status: Home / Self Care | Payer: Medicare Other | Source: Ambulatory Visit | Attending: Orthopedic Surgery | Admitting: Orthopedic Surgery

## 2018-03-20 ENCOUNTER — Ambulatory Visit (HOSPITAL_COMMUNITY): Payer: Medicare Other | Admitting: Anesthesiology

## 2018-03-20 ENCOUNTER — Other Ambulatory Visit: Payer: Self-pay

## 2018-03-20 DIAGNOSIS — Z79899 Other long term (current) drug therapy: Secondary | ICD-10-CM | POA: Diagnosis not present

## 2018-03-20 DIAGNOSIS — W19XXXA Unspecified fall, initial encounter: Secondary | ICD-10-CM | POA: Insufficient documentation

## 2018-03-20 DIAGNOSIS — I110 Hypertensive heart disease with heart failure: Secondary | ICD-10-CM | POA: Insufficient documentation

## 2018-03-20 DIAGNOSIS — M67813 Other specified disorders of tendon, right shoulder: Secondary | ICD-10-CM | POA: Diagnosis not present

## 2018-03-20 DIAGNOSIS — I509 Heart failure, unspecified: Secondary | ICD-10-CM | POA: Diagnosis not present

## 2018-03-20 DIAGNOSIS — M170 Bilateral primary osteoarthritis of knee: Secondary | ICD-10-CM | POA: Insufficient documentation

## 2018-03-20 DIAGNOSIS — S46011A Strain of muscle(s) and tendon(s) of the rotator cuff of right shoulder, initial encounter: Secondary | ICD-10-CM | POA: Insufficient documentation

## 2018-03-20 DIAGNOSIS — Z9989 Dependence on other enabling machines and devices: Secondary | ICD-10-CM | POA: Insufficient documentation

## 2018-03-20 DIAGNOSIS — Z6841 Body Mass Index (BMI) 40.0 and over, adult: Secondary | ICD-10-CM | POA: Diagnosis not present

## 2018-03-20 DIAGNOSIS — I4891 Unspecified atrial fibrillation: Secondary | ICD-10-CM | POA: Diagnosis not present

## 2018-03-20 DIAGNOSIS — G4730 Sleep apnea, unspecified: Secondary | ICD-10-CM | POA: Insufficient documentation

## 2018-03-20 HISTORY — PX: SHOULDER ARTHROSCOPY WITH ROTATOR CUFF REPAIR AND SUBACROMIAL DECOMPRESSION: SHX5686

## 2018-03-20 SURGERY — SHOULDER ARTHROSCOPY WITH ROTATOR CUFF REPAIR AND SUBACROMIAL DECOMPRESSION
Anesthesia: General | Site: Shoulder | Laterality: Right

## 2018-03-20 MED ORDER — DEXAMETHASONE SODIUM PHOSPHATE 10 MG/ML IJ SOLN
INTRAMUSCULAR | Status: DC | PRN
  Administered 2018-03-20: 5 mg via INTRAVENOUS

## 2018-03-20 MED ORDER — MIDAZOLAM HCL 2 MG/2ML IJ SOLN
2.0000 mg | Freq: Once | INTRAMUSCULAR | Status: AC
  Administered 2018-03-20: 2 mg via INTRAVENOUS

## 2018-03-20 MED ORDER — METHOCARBAMOL 500 MG PO TABS: 500.0000 mg | ORAL_TABLET | Freq: Three times a day (TID) | ORAL | 0 refills | Status: DC

## 2018-03-20 MED ORDER — GLYCOPYRROLATE PF 0.2 MG/ML IJ SOSY
PREFILLED_SYRINGE | INTRAMUSCULAR | Status: AC
  Filled 2018-03-20: qty 1

## 2018-03-20 MED ORDER — FENTANYL CITRATE (PF) 100 MCG/2ML IJ SOLN
INTRAMUSCULAR | Status: DC | PRN
  Administered 2018-03-20: 75 ug via INTRAVENOUS
  Administered 2018-03-20: 50 ug via INTRAVENOUS

## 2018-03-20 MED ORDER — OXYCODONE-ACETAMINOPHEN 5-325 MG PO TABS: 1.0000 | ORAL_TABLET | ORAL | 0 refills | Status: DC | PRN

## 2018-03-20 MED ORDER — FENTANYL CITRATE (PF) 100 MCG/2ML IJ SOLN
INTRAMUSCULAR | Status: AC
  Administered 2018-03-20: 50 ug via INTRAVENOUS
  Filled 2018-03-20: qty 2

## 2018-03-20 MED ORDER — ONDANSETRON HCL 4 MG/2ML IJ SOLN
INTRAMUSCULAR | Status: DC | PRN
  Administered 2018-03-20: 4 mg via INTRAVENOUS

## 2018-03-20 MED ORDER — FENTANYL CITRATE (PF) 100 MCG/2ML IJ SOLN
INTRAMUSCULAR | Status: AC
  Filled 2018-03-20: qty 2

## 2018-03-20 MED ORDER — FENTANYL CITRATE (PF) 250 MCG/5ML IJ SOLN
INTRAMUSCULAR | Status: AC
  Filled 2018-03-20: qty 5

## 2018-03-20 MED ORDER — FENTANYL CITRATE (PF) 100 MCG/2ML IJ SOLN
50.0000 ug | Freq: Once | INTRAMUSCULAR | Status: AC
  Administered 2018-03-20: 50 ug via INTRAVENOUS

## 2018-03-20 MED ORDER — LACTATED RINGERS IV SOLN
INTRAVENOUS | Status: DC
  Administered 2018-03-20 (×2): via INTRAVENOUS

## 2018-03-20 MED ORDER — GLYCOPYRROLATE PF 0.2 MG/ML IJ SOSY
PREFILLED_SYRINGE | INTRAMUSCULAR | Status: DC | PRN
  Administered 2018-03-20 (×2): .2 mg via INTRAVENOUS

## 2018-03-20 MED ORDER — BUPIVACAINE LIPOSOME 1.3 % IJ SUSP
INTRAMUSCULAR | Status: DC | PRN
  Administered 2018-03-20: 10 mL via PERINEURAL

## 2018-03-20 MED ORDER — PROPOFOL 10 MG/ML IV BOLUS
INTRAVENOUS | Status: AC
  Filled 2018-03-20: qty 20

## 2018-03-20 MED ORDER — ONDANSETRON HCL 4 MG/2ML IJ SOLN: 4.0000 mg | Freq: Four times a day (QID) | INTRAMUSCULAR | Status: DC | PRN

## 2018-03-20 MED ORDER — ROCURONIUM BROMIDE 50 MG/5ML IV SOSY
PREFILLED_SYRINGE | INTRAVENOUS | Status: AC
  Filled 2018-03-20: qty 5

## 2018-03-20 MED ORDER — ONDANSETRON HCL 4 MG/2ML IJ SOLN
INTRAMUSCULAR | Status: AC
  Filled 2018-03-20: qty 2

## 2018-03-20 MED ORDER — BUPIVACAINE HCL (PF) 0.5 % IJ SOLN
INTRAMUSCULAR | Status: DC | PRN
  Administered 2018-03-20: 10 mL via PERINEURAL

## 2018-03-20 MED ORDER — LIDOCAINE 2% (20 MG/ML) 5 ML SYRINGE
INTRAMUSCULAR | Status: AC
  Filled 2018-03-20: qty 5

## 2018-03-20 MED ORDER — SODIUM CHLORIDE 0.9 % IR SOLN
Status: DC | PRN
  Administered 2018-03-20: 6000 mL

## 2018-03-20 MED ORDER — OXYCODONE HCL 5 MG/5ML PO SOLN: 5.0000 mg | Freq: Once | ORAL | Status: AC | PRN

## 2018-03-20 MED ORDER — OXYCODONE HCL 5 MG PO TABS
5.0000 mg | ORAL_TABLET | Freq: Once | ORAL | Status: AC | PRN
  Administered 2018-03-20: 5 mg via ORAL

## 2018-03-20 MED ORDER — ROCURONIUM BROMIDE 50 MG/5ML IV SOSY
PREFILLED_SYRINGE | INTRAVENOUS | Status: DC | PRN
  Administered 2018-03-20: 50 mg via INTRAVENOUS

## 2018-03-20 MED ORDER — FENTANYL CITRATE (PF) 100 MCG/2ML IJ SOLN
25.0000 ug | INTRAMUSCULAR | Status: DC | PRN
  Administered 2018-03-20 (×3): 50 ug via INTRAVENOUS

## 2018-03-20 MED ORDER — LIDOCAINE 2% (20 MG/ML) 5 ML SYRINGE
INTRAMUSCULAR | Status: DC | PRN
  Administered 2018-03-20: 40 mg via INTRAVENOUS

## 2018-03-20 MED ORDER — SODIUM CHLORIDE 0.9 % IV SOLN
INTRAVENOUS | Status: DC | PRN
  Administered 2018-03-20: 25 ug/min via INTRAVENOUS

## 2018-03-20 MED ORDER — POVIDONE-IODINE 7.5 % EX SOLN
Freq: Once | CUTANEOUS | Status: DC
  Filled 2018-03-20: qty 118

## 2018-03-20 MED ORDER — MIDAZOLAM HCL 2 MG/2ML IJ SOLN
INTRAMUSCULAR | Status: AC
  Administered 2018-03-20: 2 mg via INTRAVENOUS
  Filled 2018-03-20: qty 2

## 2018-03-20 MED ORDER — PHENYLEPHRINE 40 MCG/ML (10ML) SYRINGE FOR IV PUSH (FOR BLOOD PRESSURE SUPPORT)
PREFILLED_SYRINGE | INTRAVENOUS | Status: AC
  Filled 2018-03-20: qty 10

## 2018-03-20 MED ORDER — PHENYLEPHRINE 40 MCG/ML (10ML) SYRINGE FOR IV PUSH (FOR BLOOD PRESSURE SUPPORT)
PREFILLED_SYRINGE | INTRAVENOUS | Status: DC | PRN
  Administered 2018-03-20 (×2): 80 ug via INTRAVENOUS

## 2018-03-20 MED ORDER — FENTANYL CITRATE (PF) 100 MCG/2ML IJ SOLN: INTRAMUSCULAR | Status: DC | PRN

## 2018-03-20 MED ORDER — EPHEDRINE 5 MG/ML INJ
INTRAVENOUS | Status: AC
  Filled 2018-03-20: qty 10

## 2018-03-20 MED ORDER — SUGAMMADEX SODIUM 200 MG/2ML IV SOLN
INTRAVENOUS | Status: DC | PRN
  Administered 2018-03-20: 200 mg via INTRAVENOUS

## 2018-03-20 MED ORDER — EPHEDRINE SULFATE-NACL 50-0.9 MG/10ML-% IV SOSY
PREFILLED_SYRINGE | INTRAVENOUS | Status: DC | PRN
  Administered 2018-03-20 (×3): 10 mg via INTRAVENOUS

## 2018-03-20 MED ORDER — PROPOFOL 10 MG/ML IV BOLUS
INTRAVENOUS | Status: DC | PRN
  Administered 2018-03-20: 200 mg via INTRAVENOUS

## 2018-03-20 MED ORDER — OXYCODONE HCL 5 MG PO TABS
ORAL_TABLET | ORAL | Status: AC
  Filled 2018-03-20: qty 1

## 2018-03-20 SURGICAL SUPPLY — 59 items
ANCHOR PEEK 4.75X19.1 SWLK C (Anchor) ×4 IMPLANT
BLADE SURG 11 STRL SS (BLADE) ×2 IMPLANT
BUR OVAL 4.0 (BURR) ×2 IMPLANT
CANNULA 5.75X71 LONG (CANNULA) ×2 IMPLANT
CANNULA TWIST IN 8.25X7CM (CANNULA) IMPLANT
CHLORAPREP W/TINT 26ML (MISCELLANEOUS) ×2 IMPLANT
DRAPE INCISE IOBAN 66X45 STRL (DRAPES) ×2 IMPLANT
DRAPE ORTHO SPLIT 77X108 STRL (DRAPES) ×2
DRAPE STERI 35X30 U-POUCH (DRAPES) ×2 IMPLANT
DRAPE SURG 17X23 STRL (DRAPES) ×2 IMPLANT
DRAPE SURG ORHT 6 SPLT 77X108 (DRAPES) ×2 IMPLANT
DRAPE U-SHAPE 47X51 STRL (DRAPES) ×2 IMPLANT
DRAPE U-SHAPE 76X120 STRL (DRAPES) ×2 IMPLANT
DRSG PAD ABDOMINAL 8X10 ST (GAUZE/BANDAGES/DRESSINGS) ×4 IMPLANT
GAUZE SPONGE 4X4 12PLY STRL (GAUZE/BANDAGES/DRESSINGS) ×2 IMPLANT
GAUZE SPONGE 4X4 12PLY STRL LF (GAUZE/BANDAGES/DRESSINGS) ×2 IMPLANT
GAUZE XEROFORM 1X8 LF (GAUZE/BANDAGES/DRESSINGS) ×2 IMPLANT
GLOVE BIO SURGEON STRL SZ7 (GLOVE) ×4 IMPLANT
GLOVE BIO SURGEON STRL SZ7.5 (GLOVE) ×2 IMPLANT
GLOVE BIOGEL PI IND STRL 7.0 (GLOVE) ×2 IMPLANT
GLOVE BIOGEL PI IND STRL 8 (GLOVE) ×1 IMPLANT
GLOVE BIOGEL PI INDICATOR 7.0 (GLOVE) ×2
GLOVE BIOGEL PI INDICATOR 8 (GLOVE) ×1
GOWN STRL REUS W/ TWL LRG LVL3 (GOWN DISPOSABLE) ×3 IMPLANT
GOWN STRL REUS W/ TWL XL LVL3 (GOWN DISPOSABLE) ×1 IMPLANT
GOWN STRL REUS W/TWL LRG LVL3 (GOWN DISPOSABLE) ×3
GOWN STRL REUS W/TWL XL LVL3 (GOWN DISPOSABLE) ×1
KIT BASIN OR (CUSTOM PROCEDURE TRAY) ×2 IMPLANT
KIT TURNOVER KIT B (KITS) ×2 IMPLANT
MANIFOLD NEPTUNE II (INSTRUMENTS) ×2 IMPLANT
NDL SUT 6 .5 CRC .975X.05 MAYO (NEEDLE) IMPLANT
NEEDLE HYPO 25GX1X1/2 BEV (NEEDLE) IMPLANT
NEEDLE MAYO TAPER (NEEDLE)
NEEDLE SCORPION MULTI FIRE (NEEDLE) ×2 IMPLANT
NEEDLE SPNL 18GX3.5 QUINCKE PK (NEEDLE) ×2 IMPLANT
PACK SHOULDER (CUSTOM PROCEDURE TRAY) ×2 IMPLANT
PAD ABD 8X10 STRL (GAUZE/BANDAGES/DRESSINGS) ×2 IMPLANT
PAD ARMBOARD 7.5X6 YLW CONV (MISCELLANEOUS) ×4 IMPLANT
RESECTOR FULL RADIUS 4.2MM (BLADE) ×2 IMPLANT
SLING ARM FOAM STRAP LRG (SOFTGOODS) ×2 IMPLANT
SLING ARM FOAM STRAP MED (SOFTGOODS) IMPLANT
SPONGE LAP 4X18 RFD (DISPOSABLE) IMPLANT
SUPPORT WRAP ARM LG (MISCELLANEOUS) ×2 IMPLANT
SUT 2 FIBERLOOP 20 STRT BLUE (SUTURE)
SUT ETHILON 3 0 PS 1 (SUTURE) ×2 IMPLANT
SUT TIGER TAPE 7 IN WHITE (SUTURE) ×2 IMPLANT
SUTURE 2 FIBERLOOP 20 STRT BLU (SUTURE) IMPLANT
SUTURE TAPE 1.3 40 TPR END (SUTURE) IMPLANT
SUTURETAPE 1.3 40 TPR END (SUTURE)
SYR CONTROL 10ML LL (SYRINGE) ×2 IMPLANT
TAPE CLOTH SURG 6X10 WHT LF (GAUZE/BANDAGES/DRESSINGS) ×2 IMPLANT
TAPE FIBER 2MM 7IN #2 BLUE (SUTURE) ×2 IMPLANT
TOWEL OR 17X24 6PK STRL BLUE (TOWEL DISPOSABLE) ×2 IMPLANT
TOWEL OR 17X26 10 PK STRL BLUE (TOWEL DISPOSABLE) ×2 IMPLANT
TOWEL OR NON WOVEN STRL DISP B (DISPOSABLE) ×2 IMPLANT
TUBE CONNECTING 12X1/4 (SUCTIONS) ×2 IMPLANT
TUBING ARTHROSCOPY IRRIG 16FT (MISCELLANEOUS) ×2 IMPLANT
WAND HAND CNTRL MULTIVAC 90 (MISCELLANEOUS) ×2 IMPLANT
WATER STERILE IRR 1000ML POUR (IV SOLUTION) ×2 IMPLANT

## 2018-03-20 NOTE — H&P (Signed)
Leon Thomas is an 62 y.o. male.   Chief Complaint: R shoulder pain and dysfunction HPI: Status post fall in January with right shoulder large full-thickness rotator cuff tear.  Complains of pain and weakness with reaching or lifting.  Past Medical History:  Diagnosis Date  . Arthritis    knees  . CHF (congestive heart failure) (HCC)     " A long time ago"  . Dysrhythmia    04-21-13 [facility]Dr. Jacinto HalimGanji. No further problems  . GI bleed   . Gout   . Hypertension   . Rotator cuff tear, right   . Sleep apnea    cpap nightly, settings 3    Past Surgical History:  Procedure Laterality Date  . BIOPSY  01/09/2018   Procedure: BIOPSY;  Surgeon: Kathi DerBrahmbhatt, Parag, MD;  [facility];  Service: Gastroenterology;;  . CARDIOVERSION N/A 04/21/2013   Procedure: CARDIOVERSION;  Surgeon: Pamella PertJagadeesh R Ganji, MD;  [facility];  Service: Cardiovascular;  Laterality: N/A;  h&p in file- HW  . COLONOSCOPY WITH PROPOFOL N/A 09/04/2013   Procedure: COLONOSCOPY WITH PROPOFOL;  Surgeon: Vertell NovakJames L Edwards Jr., MD;  [facility]: [facility];  Service: Endoscopy;  Laterality: N/A;  . ESOPHAGOGASTRODUODENOSCOPY (EGD) WITH PROPOFOL N/A 01/09/2018   Procedure: ESOPHAGOGASTRODUODENOSCOPY (EGD) WITH PROPOFOL;  Surgeon: Kathi DerBrahmbhatt, Parag, MD;  Location: [facility];  Service: Gastroenterology;  Laterality: N/A;  . FOOT SURGERY Left   . JOINT REPLACEMENT Left   . ORIF ANKLE FRACTURE Right     Family History  Problem Relation Age of Onset  . Diabetes Mother   . Alzheimer's disease Mother   . Diabetes Father   . Diabetes Brother   . Stroke Sister    Social History:  reports that he has never smoked. He has never used smokeless tobacco. He reports that he drinks about 1.0 standard drinks of alcohol per week. He reports that he does not use drugs.  Allergies: No Known Allergies  Medications Prior to Admission  Medication Sig Dispense Refill  . allopurinol (ZYLOPRIM)  300 MG tablet Take 1 tablet (300 mg total) by mouth daily. 90 tablet 1  . carvedilol (COREG) 25 MG tablet Take 2 tablets (50 mg total) by mouth daily. 180 tablet 1  . cloNIDine (CATAPRES - DOSED IN MG/24 HR) 0.2 mg/24hr patch PLACE 1 PATCH (0.2 MG TOTAL) ONTO THE SKIN ONCE A WEEK. 12 patch 1  . docusate sodium (COLACE) 100 MG capsule Take 1 capsule (100 mg total) by mouth daily as needed for mild constipation. 60 capsule 1  . eplerenone (INSPRA) 25 MG tablet Take 1 tablet (25 mg total) by mouth daily. 90 tablet 1  . furosemide (LASIX) 40 MG tablet Take 40 mg by mouth daily as needed for fluid 90 tablet 0  . HYDROcodone-acetaminophen (NORCO) 10-325 MG tablet Take 1 tablet by mouth every 6 (six) hours as needed. 180 tablet 0  . olmesartan (BENICAR) 40 MG tablet Take 40 mg by mouth daily.    . Omega-3 Fatty Acids (FISH OIL) 1000 MG CAPS Take 1,000 mg by mouth daily.    . pantoprazole (PROTONIX) 40 MG tablet Take 1 tablet (40 mg total) by mouth 2 (two) times daily. 180 tablet 1  . Rivaroxaban (XARELTO) 20 MG TABS tablet Take 20 mg by mouth daily.       No results found for this or any previous visit (from the past 48 hour(s)). No results found.  Review of Systems  All other  systems reviewed and are negative.   Blood pressure (!) 143/74, pulse 61, temperature 98.3 F (36.8 C), temperature source Oral, resp. rate 18, weight (!) 164.2 kg, SpO2 100 %. Physical Exam  Constitutional: He is oriented to person, place, and time. He appears well-developed and well-nourished.  HENT:  Head: Atraumatic.  Eyes: EOM are normal.  Cardiovascular: Intact distal pulses.  Respiratory: Effort normal.  Musculoskeletal:  R shoulder pain and weakness with RC testing  Neurological: He is alert and oriented to person, place, and time.  Skin: Skin is warm and dry.  Psychiatric: He has a normal mood and affect.     Assessment/Plan Right shoulder large rotator cuff tear Plan arthroscopic right shoulder rotator  cuff repair Risks / benefits of surgery discussed Consent on chart  NPO for OR Preop antibiotics   Berline Lopes, MD 03/20/2018, 11:53 AM

## 2018-03-20 NOTE — Op Note (Signed)
Procedure(s):  Procedure Note  Leon HalonJimmy R Thomas male 62 y.o. 03/20/2018  Procedure(s) and Anesthesia Type: #1 right shoulder arthroscopic rotator cuff repair #2 right shoulder arthroscopic subacromial decompression #3 right shoulder arthroscopic biceps tenotomy, debridement superior labrum  Surgeon(s) and Role:    [name]* Margot Oriordan, MD - Primary     Surgeon: [name]   Assistants: [name] PA-C [facility](Danielle was present and scrubbed throughout the procedure and was essential in positioning, assisting with the camera and instrumentation,, and closure)  Anesthesia: General endotracheal anesthesia with preoperative interscalene block given by the attending anesthesiologist    Procedure Detail   Estimated Blood Loss: Min         Drains: none  Blood Given: none         Specimens: none        Complications:  * No complications entered in OR log *         Disposition: PACU - hemodynamically stable.         Condition: stable    Procedure:   INDICATIONS FOR SURGERY: The patient is 62 y.o. male who had a history of fall in January.  He has had pain weakness in the shoulder since that time.  He was diagnosed large rotator cuff tear and sent to me because apparently there is not an operating room bed in [place] that we will hold his weight.  OPERATIVE FINDINGS: Examination under anesthesia: No stiffness or instability.   DESCRIPTION OF PROCEDURE: The patient was identified in preoperative  holding area where I personally marked the operative site after  verifying site, side, and procedure with the patient. An interscalene block was given by the attending anesthesiologist the holding area.  The patient was taken back to the operating room where general anesthesia was induced without complication and was placed in the beach-chair position with the back  elevated about 60 degrees and all extremities and head and neck carefully padded and  positioned.   The  right upper extremity was then prepped and  draped in a standard sterile fashion. The appropriate time-out  procedure was carried out. The patient did receive IV antibiotics  within 30 minutes of incision.   A small posterior portal incision was made and the arthroscope was introduced into the joint. An anterior portal was then established above the subscapularis using needle localization. Small cannula was placed anteriorly. Diagnostic arthroscopy was then carried out.  Who was noted to have some longitudinal tearing of the subscapularis without a stay out upper border visible.  The biceps tendon was severely splayed and partially torn involving about 75% of the thickness of the tendon.  This was felt to be a pain generator and no longer functionally important.  It was released from the superior labrum.  The superior labrum was extensively debrided with a shaver.  He was noted to have a complete tear of the supraspinatus extending into the infraspinatus with moderate retraction.  The arthroscope was then introduced into the subacromial space a standard lateral portal was established with needle localization. The shaver was used through the lateral portal to perform extensive bursectomy.   The supraspinatus tendon was fairly stout and healthy, however it was fairly retracted and scarred into place.  It was not easily reduced back over the tuberosity.  I spent a while both above and below the tendon freeing up adhesions trying to mobilize the tendon.  After mobilization I was able to bring it past the articular border and onto the tuberosity  but not as far lateral as I would like without significant tension.  I felt that a double row repair would not be possible in this situation and elected to move ahead with a single row repair with inverted mattress fiber tapes.  Through the large lateral cannula fiber tapes were placed in an inverted mattress configuration with one anterior one posterior and the tear.   The cannula was moved to a high lateral portal position just off the margin of the acromion.  The suture was then placed in 2 4.75 peek swivel lock anchors on the lateral one third of the tuberosity bringing the tendon over at least half of the prepared tuberosity.  The coracoacromial ligament was taken down off the anterior acromion with the ArthroCare exposing a hooked anterior acromial spur with ossification of the coracoacromial ligament.. A high-speed bur was then used through the lateral portal to take down the anterior acromial spur from lateral to medial in a standard acromioplasty.  The acromioplasty was also viewed from the lateral portal and the bur was used as necessary to ensure that the acromion was completely flat from posterior to anterior.  The arthroscopic equipment was removed from the joint and the portals were closed with 3-0 nylon in an interrupted fashion. Sterile dressings were then applied including Xeroform 4 x 4's ABDs and tape. The patient was then allowed to awaken from general anesthesia, placed in a sling, transferred to the stretcher and taken to the recovery room in stable condition.   POSTOPERATIVE PLAN: The patient will be discharged home today and will followup in one week for suture removal and wound check.  We will follow the massive cuff protocol.

## 2018-03-20 NOTE — Transfer of Care (Signed)
Immediate Anesthesia Transfer of Care Note  Patient: Leon HalonJimmy R Blane  Procedure(s) Performed: SHOULDER ARTHROSCOPY WITH ROTATOR CUFF REPAIR AND SUBACROMIAL DECOMPRESSION (Right Shoulder)  Patient Location: PACU  Anesthesia Type:GA combined with regional for post-op pain  Level of Consciousness: awake, alert  and oriented  Airway & Oxygen Therapy: Patient Spontanous Breathing and Patient connected to nasal cannula oxygen  Post-op Assessment: Report given to RN, Post -op Vital signs reviewed and stable and Patient moving all extremities  Post vital signs: Reviewed and stable  Last Vitals:  Vitals Value Taken Time  BP 98/55 03/20/2018  2:25 PM  Temp 36.2 C 03/20/2018  2:25 PM  Pulse 58 03/20/2018  2:28 PM  Resp 16 03/20/2018  2:28 PM  SpO2 100 % 03/20/2018  2:28 PM  Vitals shown include unvalidated device data.  Last Pain:  Vitals:   03/20/18 1425  TempSrc:   PainSc: (P) 3       Patients Stated Pain Goal: (P) 4 (03/20/18 1425)  Complications: No apparent anesthesia complications

## 2018-03-20 NOTE — Anesthesia Procedure Notes (Signed)
Procedure Name: Intubation Date/Time: 03/20/2018 12:45 PM Performed by: Kyung Rudd, CRNA Pre-anesthesia Checklist: Patient identified, Emergency Drugs available, Suction available, Patient being monitored and Timeout performed Patient Re-evaluated:Patient Re-evaluated prior to induction Oxygen Delivery Method: Circle system utilized Preoxygenation: Pre-oxygenation with 100% oxygen Induction Type: IV induction Ventilation: Mask ventilation without difficulty, Oral airway inserted - appropriate to patient size and Two handed mask ventilation required Laryngoscope Size: Mac and 4 Grade View: Grade I Tube type: Oral Tube size: 7.5 mm Number of attempts: 1 Airway Equipment and Method: Stylet Placement Confirmation: ETT inserted through vocal cords under direct vision,  positive ETCO2 and breath sounds checked- equal and bilateral Secured at: 21 cm Tube secured with: Tape Dental Injury: Teeth and Oropharynx as per pre-operative assessment

## 2018-03-20 NOTE — Anesthesia Preprocedure Evaluation (Addendum)
Anesthesia Evaluation  Patient identified by MRN, date of birth, ID band Patient awake    Reviewed: Allergy & Precautions, H&P , NPO status , Patient's Chart, lab work & pertinent test results  Airway Mallampati: II   Neck ROM: full    Dental  (+) Teeth Intact,    Pulmonary sleep apnea ,    breath sounds clear to auscultation       Cardiovascular hypertension, +CHF  + dysrhythmias Atrial Fibrillation  Rhythm:regular Rate:Normal     Neuro/Psych    GI/Hepatic   Endo/Other  Morbid obesity  Renal/GU      Musculoskeletal  (+) Arthritis ,   Abdominal   Peds  Hematology   Anesthesia Other Findings   Reproductive/Obstetrics                            Anesthesia Physical Anesthesia Plan  ASA: III  Anesthesia Plan: General   Post-op Pain Management:  Regional for Post-op pain   Induction: Intravenous  PONV Risk Score and Plan: 2 and Ondansetron, Dexamethasone, Midazolam and Treatment may vary due to age or medical condition  Airway Management Planned: Oral ETT  Additional Equipment:   Intra-op Plan:   Post-operative Plan: Extubation in OR  Informed Consent: I have reviewed the patients History and Physical, chart, labs and discussed the procedure including the risks, benefits and alternatives for the proposed anesthesia with the patient or authorized representative who has indicated his/her understanding and acceptance.     Plan Discussed with: CRNA, Anesthesiologist and Surgeon  Anesthesia Plan Comments:         Anesthesia Quick Evaluation

## 2018-03-20 NOTE — Anesthesia Procedure Notes (Signed)
Anesthesia Regional Block: Interscalene brachial plexus block   Pre-Anesthetic Checklist: ,, timeout performed, Correct Patient, Correct Site, Correct Laterality, Correct Procedure, Correct Position, site marked, Risks and benefits discussed,  Surgical consent,  Pre-op evaluation,  At surgeon's request and post-op pain management  Laterality: Right  Prep: chloraprep       Needles:  Injection technique: Single-shot  Needle Type: Echogenic Stimulator Needle     Needle Length: 5cm  Needle Gauge: 22     Additional Needles:   Procedures:, nerve stimulator,,,,,,,   Nerve Stimulator or Paresthesia:  Response: biceps flexion, 0.45 mA,   Additional Responses:   Narrative:  Start time: 03/20/2018 11:41 AM End time: 03/20/2018 11:50 AM Injection made incrementally with aspirations every 5 mL.  Performed by: Personally  Anesthesiologist: Achille RichHodierne, Mosetta Ferdinand, MD  Additional Notes: Functioning IV was confirmed and monitors were applied.  A 50mm 22ga Arrow echogenic stimulator needle was used. Sterile prep and drape,hand hygiene and sterile gloves were used.  Negative aspiration and negative test dose prior to incremental administration of local anesthetic. The patient tolerated the procedure well.  Ultrasound guidance: relevent anatomy identified, needle position confirmed, local anesthetic spread visualized around nerve(s), vascular puncture avoided.  Image printed for medical record.

## 2018-03-20 NOTE — Discharge Instructions (Signed)

## 2018-03-21 ENCOUNTER — Encounter (HOSPITAL_COMMUNITY): Payer: Self-pay | Admitting: Orthopedic Surgery

## 2018-03-21 ENCOUNTER — Other Ambulatory Visit: Payer: Self-pay | Admitting: Family Medicine

## 2018-03-21 MED ORDER — HYDROCODONE-ACETAMINOPHEN 10-325 MG PO TABS: 1.0000 | ORAL_TABLET | Freq: Three times a day (TID) | ORAL | 0 refills | Status: DC | PRN

## 2018-03-21 NOTE — Anesthesia Postprocedure Evaluation (Signed)
Anesthesia Post Note  Patient: Leon Thomas  Procedure(s) Performed: SHOULDER ARTHROSCOPY WITH ROTATOR CUFF REPAIR AND SUBACROMIAL DECOMPRESSION (Right Shoulder)     Patient location during evaluation: PACU Anesthesia Type: General Level of consciousness: awake and alert Pain management: pain level controlled Vital Signs Assessment: post-procedure vital signs reviewed and stable Respiratory status: spontaneous breathing, nonlabored ventilation, respiratory function stable and patient connected to nasal cannula oxygen Cardiovascular status: blood pressure returned to baseline and stable Postop Assessment: no apparent nausea or vomiting Anesthetic complications: no    Last Vitals:  Vitals:   03/20/18 1543 03/20/18 1555  BP: 118/61 (!) 112/57  Pulse: (!) 50 (!) 47  Resp: 17 17  Temp: (!) 36.3 C   SpO2: 95% 95%    Last Pain:  Vitals:   03/20/18 1555  TempSrc:   PainSc: 4                  Bellarose Burtt S

## 2018-03-21 NOTE — Progress Notes (Signed)
Review of PMP aware does not show refill since May His medications from yesterday were not picked up Will have CMA Leon Thomas call to clarify with the pharmacy.

## 2018-03-21 NOTE — Telephone Encounter (Signed)
[facility] [address] it was verified that hte patient picked up the oxycodone. Asked the pharmacist to cancel my hydrocodone refill  Spoke to Leon Thomas that he should take the oxycodone prescribed He says the hydrocodone seemed better Explained that he cannot take both He was advised to follow up with Ortho postop They will manage his pain during the postop period

## 2018-04-15 ENCOUNTER — Telehealth: Payer: Self-pay | Admitting: Family Medicine

## 2018-04-15 NOTE — Telephone Encounter (Signed)
Please see note below and refill if possible  

## 2018-04-15 NOTE — Telephone Encounter (Signed)
Copied from CRM [id number]. Topic: General - Other >> Apr 15, 2018 12:43 PM Percival SpanishKennedy, Cheryl W wrote:  Pt is calling in for his refill on HYDROcodone-acetaminophen (NORCO) 10-325 MG tablet    [facility] [address]

## 2018-04-17 MED ORDER — HYDROCODONE-ACETAMINOPHEN 10-325 MG PO TABS: 1.0000 | ORAL_TABLET | Freq: Three times a day (TID) | ORAL | 0 refills | Status: DC | PRN

## 2018-04-17 NOTE — Telephone Encounter (Signed)
Refill sent.

## 2018-06-08 ENCOUNTER — Other Ambulatory Visit: Payer: Self-pay | Admitting: Family Medicine

## 2018-06-08 DIAGNOSIS — N182 Chronic kidney disease, stage 2 (mild): Secondary | ICD-10-CM

## 2018-06-09 NOTE — Telephone Encounter (Signed)
NOV  06/23/18 Requested Prescriptions  Pending Prescriptions Disp Refills  . furosemide (LASIX) 40 MG tablet [Pharmacy Med Name: FUROSEMIDE 40 MG TABLET] 90 tablet 0    Sig: TAKE 1 TABLET BY MOUTH EVERY DAY AS NEEDED FOR FLUID     Cardiovascular:  Diuretics - Loop Passed - 06/08/2018  9:08 AM      Passed - K in normal range and within 360 days    Potassium  Date Value Ref Range Status  02/20/2018 4.1 3.5 - 5.1 mmol/L Final         Passed - Ca in normal range and within 360 days    Calcium  Date Value Ref Range Status  02/20/2018 9.2 8.9 - 10.3 mg/dL Final         Passed - Na in normal range and within 360 days    Sodium  Date Value Ref Range Status  02/20/2018 139 135 - 145 mmol/L Final  12/12/2017 143 134 - 144 mmol/L Final         Passed - Cr in normal range and within 360 days    Creatinine, Ser  Date Value Ref Range Status  02/20/2018 1.21 0.61 - 1.24 mg/dL Final         Passed - Last BP in normal range    BP Readings from Last 1 Encounters:  03/20/18 (!) 112/57         Passed - Valid encounter within last 6 months    Recent Outpatient Visits          2 months ago Disorder of right rotator cuff   Primary Care at [facility], Zoe A, MD   5 months ago Chronic pain of right knee   Primary Care at [facility], Manus Rudd, MD   9 months ago Encounter for medication monitoring   Primary Care at [facility], Manus Rudd, MD   12 months ago Chronic prescription opiate use   Primary Care at [facility], Manus Rudd, MD   1 year ago Screening for malignant neoplasm of prostate   Primary Care at [facility], Manus Rudd, MD      Future Appointments            In 2 weeks Doristine Bosworth, MD Primary Care at [place], [facility]

## 2018-06-11 ENCOUNTER — Other Ambulatory Visit: Payer: Self-pay | Admitting: Family Medicine

## 2018-06-11 MED ORDER — EPLERENONE 25 MG PO TABS: 25.0000 mg | ORAL_TABLET | Freq: Every day | ORAL | 0 refills | Status: DC

## 2018-06-11 NOTE — Telephone Encounter (Signed)
Patient request Rx to take while out of town- has appointment scheduled 06/23/18. 1 month supply sent. Requested Prescriptions  Pending Prescriptions Disp Refills  . eplerenone (INSPRA) 25 MG tablet 30 tablet 0    Sig: Take 1 tablet (25 mg total) by mouth daily.     Cardiovascular: Diuretics - Aldosterone Antagonist Passed - 06/11/2018  2:14 PM      Passed - Cr in normal range and within 360 days    Creatinine, Ser  Date Value Ref Range Status  02/20/2018 1.21 0.61 - 1.24 mg/dL Final         Passed - K in normal range and within 360 days    Potassium  Date Value Ref Range Status  02/20/2018 4.1 3.5 - 5.1 mmol/L Final         Passed - Na in normal range and within 360 days    Sodium  Date Value Ref Range Status  02/20/2018 139 135 - 145 mmol/L Final  12/12/2017 143 134 - 144 mmol/L Final         Passed - Last BP in normal range    BP Readings from Last 1 Encounters:  03/20/18 (!) 112/57         Passed - Valid encounter within last 6 months    Recent Outpatient Visits          2 months ago Disorder of right rotator cuff   [facility], Zoe A, MD   6 months ago Chronic pain of right knee   [facility], Manus RuddZoe A, MD   9 months ago Encounter for medication monitoring   [facility] at [facility], Manus RuddZoe A, MD   1 year ago Chronic prescription opiate use   [facility] at [facility], Manus RuddZoe A, MD   1 year ago Screening for malignant neoplasm of prostate   [facility] at [facility], Manus RuddZoe A, MD      Future Appointments            In 1 week Doristine BosworthStallings, Zoe A, MD Primary Care at [place], [facility]

## 2018-06-11 NOTE — Telephone Encounter (Signed)
Copied from CRM [id number] [id number]. Topic: Quick Communication - Rx Refill/Question >> Jun 11, 2018  2:09 PM Lynne LoganHudson, Caryn D wrote: Medication: eplerenone (INSPRA) 25 MG tablet / Pt requesting this one time [facility] [place] being that he is out of town  Has the patient contacted their pharmacy? Yes.   (Agent: If no, request that the patient contact the pharmacy for the refill.) (Agent: If yes, when and what did the pharmacy advise?)  Preferred Pharmacy (with phone number or street name): [id number] [place] [place] - [phone] (Phone) [phone])    Agent: Please be advised that RX refills may take up to 3 business days. We ask that you follow-up with your pharmacy.

## 2018-06-19 ENCOUNTER — Telehealth: Payer: Self-pay | Admitting: Family Medicine

## 2018-06-19 NOTE — Telephone Encounter (Signed)
Do you want us to overbook you for this patient since he is only in town from 11/26 to 12/3 please let us know.  Thank you!

## 2018-06-19 NOTE — Telephone Encounter (Signed)
Copied from CRM [id number]. Topic: Appointment Scheduling - Scheduling Inquiry for Clinic >> Jun 19, 2018 10:52 AM Angela NevinWilliams, Candice N wrote: Reason for CRM: Patient called to reschedule appointment for 11/15- stating he is in the middle of relocating to [place], [place] and will not be in town until 11/26 and is only here until 12/3. Patient would like to know if there is any way to scheduled fu visit with Dr. Creta LevinStallings in that time frame. Please advise. >> Jun 19, 2018 11:15 AM Elveria Risingockman, Caitlin P wrote: Will forward to Dr Creta LevinStallings to see if she wants to [facility].

## 2018-06-22 NOTE — Telephone Encounter (Signed)
If he needs refills please let me know.

## 2018-06-22 NOTE — Telephone Encounter (Signed)
Please let him know that with the holidays and our tight scheduled I cannot see him.  He has to see a different provider.

## 2018-06-23 ENCOUNTER — Ambulatory Visit: Payer: Medicare Other | Admitting: Family Medicine

## 2018-06-23 ENCOUNTER — Other Ambulatory Visit: Payer: Self-pay

## 2018-06-23 MED ORDER — HYDROCODONE-ACETAMINOPHEN 10-325 MG PO TABS: 1.0000 | ORAL_TABLET | Freq: Three times a day (TID) | ORAL | 0 refills | Status: DC | PRN

## 2018-06-23 NOTE — Telephone Encounter (Signed)
Called pt per message and pt is looking for a refill of his Hydrocodone. He will be in town Thursday - Dec 5th bur Dr. Creta LevinStallings does not have any appts available during that time. I advised pt that I would put the message in to Dr. Creta LevinStallings and someone would call him. When Dr. Creta LevinStallings either sends in a RX or lets us know if he has to see a different provider, could someone please call pt back and advise. Thank you!

## 2018-06-23 NOTE — Telephone Encounter (Signed)
I will do his refill as he has been very compliant. I will print it for him to pick up from the front desk.  I will sign it now before I forget.

## 2018-06-24 NOTE — Telephone Encounter (Signed)
Spoke with pt advised dr needs to know what pharmacy to send rx.  Pt [facility] [place]- per pt this pharmacy is already on file.  Advised will advise provider.  Pt agreeable. Dgaddy, CMA

## 2018-07-03 ENCOUNTER — Telehealth: Payer: Self-pay | Admitting: Family Medicine

## 2018-07-03 NOTE — Telephone Encounter (Signed)
Copied from CRM [id number]. Topic: Quick Communication - Rx Refill/Question >> Jul 03, 2018  3:25 PM [place], [place] D wrote: Medication: HYDROcodone-acetaminophen (NORCO) 10-325 MG tablet / [place] and had requested that his Rxs be sent to [facility]  down [facility] but he is back in [place] and would like for Rx to be transferred to [facility] [address] [place], [place] / Please advise as pharmacy stated they cannot transfer narcotic  Has the patient contacted their pharmacy? Yes.   (Agent: If no, request that the patient contact the pharmacy for the refill.) (Agent: If yes, when and what did the pharmacy advise?)  Preferred Pharmacy (with phone number or street name): [id number] Past[facility], [place] - [address] [phone] (Phone) [phone] (Fax)    Agent: Please be advised that RX refills may take up to 3 business days. We ask that you follow-up with your pharmacy.

## 2018-07-03 NOTE — Telephone Encounter (Signed)
Message sent to Dr. Creta LevinStallings Re: pt wants narcotic called to different pharmacy - out of town

## 2018-07-05 ENCOUNTER — Other Ambulatory Visit: Payer: Self-pay | Admitting: Family Medicine

## 2018-07-07 NOTE — Telephone Encounter (Signed)
Enough medication given (35 tabs) until his appointment on 08/11/18. Requested Prescriptions  Pending Prescriptions Disp Refills  . eplerenone (INSPRA) 25 MG tablet [Pharmacy Med Name: EPLERENONE 25 MG TABLET] 35 tablet 0    Sig: TAKE 1 TABLET BY MOUTH EVERY DAY     Cardiovascular: Diuretics - Aldosterone Antagonist Passed - 07/07/2018  8:25 AM      Passed - Cr in normal range and within 360 days    Creatinine, Ser  Date Value Ref Range Status  02/20/2018 1.21 0.61 - 1.24 mg/dL Final         Passed - K in normal range and within 360 days    Potassium  Date Value Ref Range Status  02/20/2018 4.1 3.5 - 5.1 mmol/L Final         Passed - Na in normal range and within 360 days    Sodium  Date Value Ref Range Status  02/20/2018 139 135 - 145 mmol/L Final  12/12/2017 143 134 - 144 mmol/L Final         Passed - Last BP in normal range    BP Readings from Last 1 Encounters:  03/20/18 (!) 112/57         Passed - Valid encounter within last 6 months    Recent Outpatient Visits          3 months ago Disorder of right rotator cuff   [facility], Zoe A, MD   6 months ago Chronic pain of right knee   [facility], Manus RuddZoe A, MD   9 months ago Encounter for medication monitoring   [facility], Manus RuddZoe A, MD   1 year ago Chronic prescription opiate use   [facility], Manus RuddZoe A, MD   1 year ago Screening for malignant neoplasm of prostate   [facility] at [facility], Manus RuddZoe A, MD      Future Appointments            In 1 month Doristine BosworthStallings, Zoe A, MD [facility] Care at [place], [facility]

## 2018-07-07 NOTE — Telephone Encounter (Signed)
Called patient regarding clarification on the pharmacy to use for his refills. He wants to use the [facility] at [address]  [place], [place]. Pharmacy updated.

## 2018-07-07 NOTE — Telephone Encounter (Signed)
Pt requesting [facility]co [facility]  [address], [place].

## 2018-07-09 NOTE — Telephone Encounter (Signed)
Please see note below. 

## 2018-07-18 ENCOUNTER — Telehealth: Payer: Self-pay | Admitting: Family Medicine

## 2018-07-18 DIAGNOSIS — M1A3720 Chronic gout due to renal impairment, left ankle and foot, without tophus (tophi): Secondary | ICD-10-CM

## 2018-07-18 MED ORDER — EPLERENONE 25 MG PO TABS: 25.0000 mg | ORAL_TABLET | Freq: Every day | ORAL | 1 refills | Status: DC

## 2018-07-18 NOTE — Telephone Encounter (Signed)
Please Advise. Patient is requesting a refill of the following medications: Requested Prescriptions    No prescriptions requested or ordered in this encounter

## 2018-07-18 NOTE — Telephone Encounter (Signed)
[place]  I would like this phone in or faxed. It won't let me sign it so I will rewrite it.

## 2018-07-18 NOTE — Telephone Encounter (Signed)
Copied from CRM [id number]. Topic: Quick Communication - Rx Refill/Question >> Jul 18, 2018  1:33 PM Jens SomMedley, Jennifer A wrote: Medication: Rivaroxaban (XARELTO) 20 MG TABS tablet [[id number]][[id number]] , allopurinol (ZYLOPRIM) 300 MG tablet [[id number]][[id number]] HYDROcodone-acetaminophen (NORCO) 10-325 MG tablet [[id number]][[id number]] [facility]?   Has the patient contacted their pharmacy? Yes  (Agent: If no, request that the patient contact the pharmacy for the refill.) (Agent: If yes, when and what did the pharmacy advise?)  Preferred Pharmacy (with phone number or street name): [id number] [place], [place] - [address] [phone] (Phone) [phone] (Fax)    Agent: Please be advised that RX refills may take up to 3 business days. We ask that you follow-up with your pharmacy.

## 2018-07-19 MED ORDER — RIVAROXABAN 20 MG PO TABS: 20.0000 mg | ORAL_TABLET | Freq: Every day | ORAL | 3 refills | Status: DC

## 2018-07-19 MED ORDER — ALLOPURINOL 300 MG PO TABS: 300.0000 mg | ORAL_TABLET | Freq: Every day | ORAL | 1 refills | Status: DC

## 2018-07-19 NOTE — Telephone Encounter (Signed)
Called patient and told him to make an appointment to come in to the office on 07/21/18 I can see him at 10:20am. He to get a new pain contract done and to clear up this confusion about his pharmacy. He has only been talking a half of his norco thus his dose can be reduced.   I need the patient put into my 10:20am time slot please.

## 2018-07-21 ENCOUNTER — Ambulatory Visit (INDEPENDENT_AMBULATORY_CARE_PROVIDER_SITE_OTHER): Payer: Medicare Other | Admitting: Family Medicine

## 2018-07-21 ENCOUNTER — Other Ambulatory Visit: Payer: Self-pay

## 2018-07-21 ENCOUNTER — Other Ambulatory Visit: Payer: Self-pay | Admitting: Family Medicine

## 2018-07-21 ENCOUNTER — Encounter: Payer: Self-pay | Admitting: Family Medicine

## 2018-07-21 VITALS — BP 138/82 | HR 62 | Temp 98.4°F | Resp 16 | Ht 74.0 in | Wt 372.0 lb

## 2018-07-21 DIAGNOSIS — I10 Essential (primary) hypertension: Secondary | ICD-10-CM | POA: Diagnosis not present

## 2018-07-21 DIAGNOSIS — Z5181 Encounter for therapeutic drug level monitoring: Secondary | ICD-10-CM

## 2018-07-21 DIAGNOSIS — Z7901 Long term (current) use of anticoagulants: Secondary | ICD-10-CM | POA: Diagnosis not present

## 2018-07-21 DIAGNOSIS — N182 Chronic kidney disease, stage 2 (mild): Secondary | ICD-10-CM

## 2018-07-21 DIAGNOSIS — M1A3720 Chronic gout due to renal impairment, left ankle and foot, without tophus (tophi): Secondary | ICD-10-CM

## 2018-07-21 DIAGNOSIS — Z79891 Long term (current) use of opiate analgesic: Secondary | ICD-10-CM

## 2018-07-21 MED ORDER — EPLERENONE 25 MG PO TABS: 25.0000 mg | ORAL_TABLET | Freq: Every day | ORAL | 1 refills | Status: DC

## 2018-07-21 MED ORDER — ALLOPURINOL 300 MG PO TABS: 300.0000 mg | ORAL_TABLET | Freq: Every day | ORAL | 1 refills | Status: DC

## 2018-07-21 MED ORDER — FUROSEMIDE 40 MG PO TABS: ORAL_TABLET | ORAL | 0 refills | Status: DC

## 2018-07-21 MED ORDER — HYDROCODONE-ACETAMINOPHEN 10-325 MG PO TABS: 0.5000 | ORAL_TABLET | Freq: Three times a day (TID) | ORAL | 0 refills | Status: AC | PRN

## 2018-07-21 MED ORDER — RIVAROXABAN 20 MG PO TABS: 20.0000 mg | ORAL_TABLET | Freq: Every day | ORAL | 3 refills | Status: DC

## 2018-07-21 NOTE — Progress Notes (Signed)
Established Patient Office Visit  Subjective:  Patient ID: Leon Thomas, male    DOB: 1956-04-09  Age: 62 y.o. MRN: [id number]  CC:  Chief Complaint  Patient presents with  . Knee Pain    hx of chronic pain hx of RA and goit    HPI Leon Thomas presents for   Patient is here for medication refills He states that he has cut down on his medication for his pain to make them last longer He is TAKING HIS MEDICATIONS but just by half a pill.   Wt Readings from Last 3 Encounters:  07/21/18 (!) 372 lb (168.7 kg)  03/20/18 (!) 362 lb (164.2 kg)  03/17/18 (!) 362 lb 3.2 oz (164.3 kg)   Hypertension: Patient here for follow-up of elevated blood pressure. He is not exercising and is not adherent to low salt diet.  Blood pressure is well controlled at home. Cardiac symptoms none. Patient denies chest pain, chest pressure/discomfort, dyspnea, exertional chest pressure/discomfort, irregular heart beat and lower extremity edema.  Cardiovascular risk factors: advanced age (older than 64 for men, 17 for women), dyslipidemia, hypertension, male gender and sedentary lifestyle.  History of target organ damage: chronic kidney disease. BP Readings from Last 3 Encounters:  07/21/18 138/82  03/20/18 (!) 112/57  03/17/18 (!) 144/88   CKD Lab Results  Component Value Date   CREATININE 1.34 (H) 07/21/2018   Pt is avoiding dehydration but does not have control over his meals since he is staying with family He does not add any additional salts No alcohol use Is not using NSAIDs   Past Medical History:  Diagnosis Date  . Arthritis    knees  . CHF (congestive heart failure) ([place])     " A long time ago"  . Dysrhythmia    04-21-13 Cardioverted at [facility] for A. Fib-Dr. Einar Gip. No further problems  . GI bleed   . Gout   . Hypertension   . Rotator cuff tear, right   . Sleep apnea    cpap nightly, settings 3    Past Surgical History:  Procedure Laterality Date  . BIOPSY  01/09/2018   Procedure: BIOPSY;  Surgeon: Otis Brace, MD;  Location: [facility];  Service: Gastroenterology;;  . CARDIOVERSION N/A 04/21/2013   Procedure: CARDIOVERSION;  Surgeon: Laverda Page, MD;  Location: [facility] ENDOSCOPY;  Service: Cardiovascular;  Laterality: N/A;  h&p in file- HW  . COLONOSCOPY WITH PROPOFOL N/A 09/04/2013   Procedure: COLONOSCOPY WITH PROPOFOL;  Surgeon: Winfield Cunas., MD;  Location: [facility] ENDOSCOPY;  Service: Endoscopy;  Laterality: N/A;  . ESOPHAGOGASTRODUODENOSCOPY (EGD) WITH PROPOFOL N/A 01/09/2018   Procedure: ESOPHAGOGASTRODUODENOSCOPY (EGD) WITH PROPOFOL;  Surgeon: Otis Brace, MD;  Location: [facility] ENDOSCOPY;  Service: Gastroenterology;  Laterality: N/A;  . FOOT SURGERY Left   . JOINT REPLACEMENT Left   . ORIF ANKLE FRACTURE Right   . SHOULDER ARTHROSCOPY WITH ROTATOR CUFF REPAIR AND SUBACROMIAL DECOMPRESSION Right 03/20/2018   Procedure: SHOULDER ARTHROSCOPY WITH ROTATOR CUFF REPAIR AND SUBACROMIAL DECOMPRESSION;  Surgeon: Tania Ade, MD;  Location: [facility];  Service: Orthopedics;  Laterality: Right;    Family History  Problem Relation Age of Onset  . Diabetes Mother   . Alzheimer's disease Mother   . Diabetes Father   . Diabetes Brother   . Stroke Sister     Social History   Socioeconomic History  . Marital status: Single    Spouse name: Not on file  . Number of children: Not on  file  . Years of education: Not on file  . Highest education level: 11th grade  Occupational History  . Not on file  Social Needs  . Financial resource strain: Very hard  . Food insecurity:    Worry: Often true    Inability: Often true  . Transportation needs:    Medical: No    Non-medical: No  Tobacco Use  . Smoking status: Never Smoker  . Smokeless tobacco: Never Used  Substance and Sexual Activity  . Alcohol use: Yes    Alcohol/week: 1.0 standard drinks    Types: 1 Cans of beer per week    Comment: drink occasional beer  . Drug use: No  . Sexual  activity: Not on file  Lifestyle  . Physical activity:    Days per week: 0 days    Minutes per session: 0 min  . Stress: Very much  Relationships  . Social connections:    Talks on phone: More than three times a week    Gets together: More than three times a week    Attends religious service: Never    Active member of club or organization: No    Attends meetings of clubs or organizations: Never    Relationship status: Never married  . Intimate partner violence:    Fear of current or ex partner: No    Emotionally abused: No    Physically abused: No    Forced sexual activity: No  Other Topics Concern  . Not on file  Social History Narrative  . Not on file    Outpatient Medications Prior to Visit  Medication Sig Dispense Refill  . allopurinol (ZYLOPRIM) 300 MG tablet Take 1 tablet (300 mg total) by mouth daily. 90 tablet 1  . carvedilol (COREG) 25 MG tablet Take 2 tablets (50 mg total) by mouth daily. 180 tablet 1  . cloNIDine (CATAPRES - DOSED IN MG/24 HR) 0.2 mg/24hr patch PLACE 1 PATCH (0.2 MG TOTAL) ONTO THE SKIN ONCE A WEEK. 12 patch 1  . docusate sodium (COLACE) 100 MG capsule Take 1 capsule (100 mg total) by mouth daily as needed for mild constipation. 60 capsule 1  . HYDROcodone-acetaminophen (NORCO) 10-325 MG tablet Take 1 tablet by mouth every 8 (eight) hours as needed. 90 tablet 0  . methocarbamol (ROBAXIN) 500 MG tablet Take 1 tablet (500 mg total) by mouth 3 (three) times daily. 30 tablet 0  . olmesartan (BENICAR) 40 MG tablet Take 40 mg by mouth daily.    . Omega-3 Fatty Acids (FISH OIL) 1000 MG CAPS Take 1,000 mg by mouth daily.    Marland Kitchen oxyCODONE-acetaminophen (PERCOCET) 5-325 MG tablet Take 1-2 tablets by mouth every 4 (four) hours as needed for severe pain. 30 tablet 0  . pantoprazole (PROTONIX) 40 MG tablet Take 1 tablet (40 mg total) by mouth 2 (two) times daily. 180 tablet 1  . eplerenone (INSPRA) 25 MG tablet Take 1 tablet (25 mg total) by mouth daily. 90 tablet 1    . furosemide (LASIX) 40 MG tablet TAKE 1 TABLET BY MOUTH EVERY DAY AS NEEDED FOR FLUID 90 tablet 0  . rivaroxaban (XARELTO) 20 MG TABS tablet Take 1 tablet (20 mg total) by mouth daily. 30 tablet 3   No facility-administered medications prior to visit.     No Known Allergies  ROS Review of Systems Review of Systems  Constitutional: Negative for activity change, appetite change, chills and fever.  HENT: Negative for congestion, nosebleeds, trouble swallowing and voice change.  Respiratory: Negative for cough, shortness of breath and wheezing.   Gastrointestinal: Negative for diarrhea, nausea and vomiting.  Genitourinary: Negative for difficulty urinating, dysuria, flank pain and hematuria.  Neurological: Negative for dizziness, speech difficulty, light-headedness and numbness.  See HPI. All other review of systems negative.     Objective:    Physical Exam  BP 138/82 (BP Location: Right Arm, Patient Position: Sitting, Cuff Size: Large)   Pulse 62   Temp 98.4 F (36.9 C) (Oral)   Resp 16   Ht '6\' 2"'$  (1.88 m)   Wt (!) 372 lb (168.7 kg)   SpO2 96%   BMI 47.76 kg/m  Wt Readings from Last 3 Encounters:  07/21/18 (!) 372 lb (168.7 kg)  03/20/18 (!) 362 lb (164.2 kg)  03/17/18 (!) 362 lb 3.2 oz (164.3 kg)   Physical Exam  Constitutional: Oriented to person, place, and time. Appears well-developed and well-nourished. Obese male HENT:  Head: Normocephalic and atraumatic.  Eyes: Conjunctivae and EOM are normal.  Cardiovascular: Normal rate, regular rhythm, normal heart sounds and intact distal pulses.  No murmur heard. Pulmonary/Chest: Effort normal and breath sounds normal. No stridor. No respiratory distress. Has no wheezes.  Neurological: Is alert and oriented to person, place, and time.  Skin: Skin is warm. Capillary refill takes less than 2 seconds.  Psychiatric: Has a normal mood and affect. Behavior is normal. Judgment and thought content normal.    There are no  preventive care reminders to display for this patient.  There are no preventive care reminders to display for this patient.  Lab Results  Component Value Date   TSH 4.270 05/13/2017   Lab Results  Component Value Date   WBC 5.9 02/20/2018   HGB 14.2 02/20/2018   HCT 43.4 02/20/2018   MCV 89.1 02/20/2018   PLT 174 02/20/2018   Lab Results  Component Value Date   NA 139 02/20/2018   K 4.1 02/20/2018   CO2 27 02/20/2018   GLUCOSE 95 02/20/2018   BUN 17 02/20/2018   CREATININE 1.21 02/20/2018   BILITOT 1.0 01/07/2018   ALKPHOS 47 01/07/2018   AST 16 01/07/2018   ALT 13 (L) 01/07/2018   PROT 7.7 01/07/2018   ALBUMIN 4.0 01/07/2018   CALCIUM 9.2 02/20/2018   ANIONGAP 11 02/20/2018   Lab Results  Component Value Date   CHOL 196 05/13/2017   Lab Results  Component Value Date   HDL 107 05/13/2017   Lab Results  Component Value Date   LDLCALC 80 05/13/2017   Lab Results  Component Value Date   TRIG 46 05/13/2017   Lab Results  Component Value Date   CHOLHDL 1.8 05/13/2017   Lab Results  Component Value Date   HGBA1C 5.2 05/13/2017      Assessment & Plan:   Problem List Items Addressed This Visit      Other   Morbid obesity ([place])   - unchanged  Weight loss strategies discussed Pt is limited by his mobility and pain    Relevant Orders   Lipid panel   Hemoglobin A1c   TSH   Chronic anticoagulation    Other Visit Diagnoses    Essential hypertension    -  Primary   - refilled his bp meds    Relevant Medications   eplerenone (INSPRA) 25 MG tablet   furosemide (LASIX) 40 MG tablet   rivaroxaban (XARELTO) 20 MG TABS tablet   Other Relevant Orders   CMP14+EGFR   Lipid panel  Chronic renal impairment, stage 2 (mild)    -  Discussed that he should continue to monitor his salt and keep his bp in control    Relevant Medications   furosemide (LASIX) 40 MG tablet   Other Relevant Orders   CMP14+EGFR   Encounter for medication monitoring    -  Discussed  that if he is taking half a pill then he should half his dose.    Relevant Orders   CMP14+EGFR      Meds ordered this encounter  Medications  . eplerenone (INSPRA) 25 MG tablet    Sig: Take 1 tablet (25 mg total) by mouth daily.    Dispense:  90 tablet    Refill:  1  . furosemide (LASIX) 40 MG tablet    Sig: TAKE 1 TABLET BY MOUTH EVERY DAY AS NEEDED FOR FLUID    Dispense:  90 tablet    Refill:  0  . rivaroxaban (XARELTO) 20 MG TABS tablet    Sig: Take 1 tablet (20 mg total) by mouth daily.    Dispense:  30 tablet    Refill:  3    Follow-up: No follow-ups on file.    Forrest Moron, MD

## 2018-07-21 NOTE — Patient Instructions (Signed)
° ° ° °  If you have lab work done today you will be contacted with your lab results within the next 2 weeks.  If you have not heard from us then please contact us. The fastest way to get your results is to register for My Chart. ° ° °IF you received an x-ray today, you will receive an invoice from [facility]. Please contact [facility] at [phone] with questions or concerns regarding your invoice.  ° °IF you received labwork today, you will receive an invoice from [facility]. Please contact [facility] at [phone] with questions or concerns regarding your invoice.  ° °Our billing staff will not be able to assist you with questions regarding bills from these companies. ° °You will be contacted with the lab results as soon as they are available. The fastest way to get your results is to activate your My Chart account. Instructions are located on the last page of this paperwork. If you have not heard from us regarding the results in 2 weeks, please contact this office. °  ° ° ° °

## 2018-07-22 LAB — CMP14+EGFR
ALK PHOS: 62 IU/L (ref 39–117)
ALT: 13 IU/L (ref 0–44)
AST: 12 IU/L (ref 0–40)
Albumin/Globulin Ratio: 1.3 (ref 1.2–2.2)
Albumin: 3.9 g/dL (ref 3.6–4.8)
BUN/Creatinine Ratio: 12 (ref 10–24)
BUN: 16 mg/dL (ref 8–27)
Bilirubin Total: 1.2 mg/dL (ref 0.0–1.2)
CALCIUM: 9.1 mg/dL (ref 8.6–10.2)
CO2: 24 mmol/L (ref 20–29)
CREATININE: 1.34 mg/dL — AB (ref 0.76–1.27)
Chloride: 100 mmol/L (ref 96–106)
GFR calc Af Amer: 66 mL/min/{1.73_m2} (ref >59)
GFR calc non Af Amer: 57 mL/min/{1.73_m2} — ABNORMAL LOW (ref >59)
GLOBULIN, TOTAL: 3.1 g/dL (ref 1.5–4.5)
GLUCOSE: 105 mg/dL — AB (ref 65–99)
Potassium: 3.6 mmol/L (ref 3.5–5.2)
SODIUM: 139 mmol/L (ref 134–144)
Total Protein: 7 g/dL (ref 6.0–8.5)

## 2018-07-22 LAB — LIPID PANEL
Chol/HDL Ratio: 3.2 ratio (ref 0.0–5.0)
Cholesterol, Total: 186 mg/dL (ref 100–199)
HDL: 58 mg/dL (ref >39)
LDL CALC: 100 mg/dL — AB (ref 0–99)
TRIGLYCERIDES: 141 mg/dL (ref 0–149)
VLDL Cholesterol Cal: 28 mg/dL (ref 5–40)

## 2018-07-22 LAB — TSH: TSH: 5.72 u[IU]/mL — AB (ref 0.450–4.500)

## 2018-07-22 LAB — HEMOGLOBIN A1C
Est. average glucose Bld gHb Est-mCnc: 114 mg/dL
Hgb A1c MFr Bld: 5.6 % (ref 4.8–5.6)

## 2018-07-24 LAB — TOXASSURE SELECT 13 (MW), URINE

## 2018-08-11 ENCOUNTER — Ambulatory Visit: Payer: Self-pay | Admitting: Family Medicine

## 2018-08-28 ENCOUNTER — Other Ambulatory Visit: Payer: Self-pay | Admitting: Family Medicine

## 2018-08-28 DIAGNOSIS — M1A3720 Chronic gout due to renal impairment, left ankle and foot, without tophus (tophi): Secondary | ICD-10-CM

## 2018-08-28 NOTE — Telephone Encounter (Signed)
[facility] and spoke to Clydie Braun, [facility] about Allopurinol request. I advised it was sent on 07/21/18 #90/1 refill, she verified it was received. Refused request.

## 2018-09-08 ENCOUNTER — Other Ambulatory Visit: Payer: Self-pay | Admitting: Family Medicine

## 2018-09-08 DIAGNOSIS — K219 Gastro-esophageal reflux disease without esophagitis: Secondary | ICD-10-CM

## 2018-11-06 ENCOUNTER — Telehealth: Payer: Self-pay | Admitting: Family Medicine

## 2018-11-06 DIAGNOSIS — G894 Chronic pain syndrome: Secondary | ICD-10-CM

## 2018-11-06 DIAGNOSIS — Z79891 Long term (current) use of opiate analgesic: Secondary | ICD-10-CM

## 2018-11-06 DIAGNOSIS — M1A3720 Chronic gout due to renal impairment, left ankle and foot, without tophus (tophi): Secondary | ICD-10-CM

## 2018-11-06 NOTE — Telephone Encounter (Signed)
Discussed with the patient that his urine showed no presence of opiates despite him being on opiates at the time  He should establish with pain management since he is in violation of our Control Substances Policy  He is agreeable and understand that we will monitor the rest of his chronic illnesses

## 2018-11-06 NOTE — Telephone Encounter (Signed)
Pt called requesting an rx for hydrocodone for his left knee pain that he having. Please advise.

## 2018-11-06 NOTE — Telephone Encounter (Signed)
Please advise 

## 2018-11-11 ENCOUNTER — Other Ambulatory Visit: Payer: Self-pay

## 2018-11-11 MED ORDER — OLMESARTAN MEDOXOMIL 40 MG PO TABS: 40.0000 mg | ORAL_TABLET | Freq: Every day | ORAL | 0 refills | Status: DC

## 2018-12-11 ENCOUNTER — Other Ambulatory Visit: Payer: Self-pay | Admitting: Family Medicine

## 2018-12-11 DIAGNOSIS — N182 Chronic kidney disease, stage 2 (mild): Secondary | ICD-10-CM

## 2018-12-26 NOTE — Telephone Encounter (Signed)
Pt stated he never received a referral to Pain Management and want a call back to discuss because it has been in terrible pain and he hasn't had any pain medication in a while. Please call pt.

## 2018-12-29 NOTE — Telephone Encounter (Signed)
Called and spoke with pt and informed him that due to his last urine screening we are unable to prescribe him any pain medication. Also informed him that his referral has been placed to pain management but due to COVID the office has not called pt to schedule. Informed him to give them a few more days to call him to schedule. He verbalized understanding.

## 2019-01-10 ENCOUNTER — Other Ambulatory Visit: Payer: Self-pay | Admitting: Family Medicine

## 2019-01-10 DIAGNOSIS — N182 Chronic kidney disease, stage 2 (mild): Secondary | ICD-10-CM

## 2019-02-21 ENCOUNTER — Other Ambulatory Visit: Payer: Self-pay | Admitting: Cardiology

## 2019-02-21 ENCOUNTER — Other Ambulatory Visit: Payer: Self-pay | Admitting: Family Medicine

## 2019-02-21 DIAGNOSIS — N182 Chronic kidney disease, stage 2 (mild): Secondary | ICD-10-CM

## 2019-02-21 NOTE — Telephone Encounter (Signed)
Courtesy refill 30 day refill Requested Prescriptions  Pending Prescriptions Disp Refills  . furosemide (LASIX) 40 MG tablet [Pharmacy Med Name: FUROSEMIDE 40 MG TABLET] 30 tablet 0    Sig: TAKE 1 TABLET BY MOUTH EVERY DAY AS NEEDED FOR FLUID     Cardiovascular:  Diuretics - Loop Failed - 02/21/2019  1:50 PM      Failed - Cr in normal range and within 360 days    Creatinine, Ser  Date Value Ref Range Status  07/21/2018 1.34 (H) 0.76 - 1.27 mg/dL Final         Failed - Valid encounter within last 6 months    Recent Outpatient Visits          7 months ago Essential hypertension   [facility] [place], [place]   11 months ago Disorder of right rotator cuff   Primary Care at [facility], Zoe A, MD   1 year ago Chronic pain of right knee   Primary Care at [facility], Arlie Solomons, MD   1 year ago Encounter for medication monitoring   [facility] at [facility], [place], MD   1 year ago Chronic prescription opiate use   [facility] at [place], MD             Passed - K in normal range and within 360 days    Potassium  Date Value Ref Range Status  07/21/2018 3.6 3.5 - 5.2 mmol/L Final         Passed - Ca in normal range and within 360 days    Calcium  Date Value Ref Range Status  07/21/2018 9.1 8.6 - 10.2 mg/dL Final         Passed - Na in normal range and within 360 days    Sodium  Date Value Ref Range Status  07/21/2018 139 134 - 144 mmol/L Final         Passed - Last BP in normal range    BP Readings from Last 1 Encounters:  07/21/18 138/82

## 2019-02-25 ENCOUNTER — Other Ambulatory Visit: Payer: Self-pay

## 2019-03-02 ENCOUNTER — Other Ambulatory Visit: Payer: Self-pay | Admitting: Family Medicine

## 2019-03-02 DIAGNOSIS — K219 Gastro-esophageal reflux disease without esophagitis: Secondary | ICD-10-CM

## 2019-03-02 NOTE — Telephone Encounter (Signed)
Requested Prescriptions  Pending Prescriptions Disp Refills  . carvedilol (COREG) 25 MG tablet [Pharmacy Med Name: CARVEDILOL 25 MG TABLET] 60 tablet 0    Sig: TAKE 2 TABLETS BY MOUTH EVERY DAY     Cardiovascular:  Beta Blockers Failed - 03/02/2019  1:41 AM      Failed - Valid encounter within last 6 months    Recent Outpatient Visits          7 months ago Essential hypertension   Primary Care at [facility], [place] A, MD   11 months ago Disorder of right rotator cuff   Primary Care at [facility], Arlie Solomons, MD   1 year ago Chronic pain of right knee   Primary Care at [facility], Arlie Solomons, MD   1 year ago Encounter for medication monitoring   Primary Care at [facility], Arlie Solomons, MD   1 year ago Chronic prescription opiate use   [facility] at [place], MD             Passed - Last BP in normal range    BP Readings from Last 1 Encounters:  07/21/18 138/82         Passed - Last Heart Rate in normal range    Pulse Readings from Last 1 Encounters:  07/21/18 62         . pantoprazole (PROTONIX) 40 MG tablet [Pharmacy Med Name: PANTOPRAZOLE SOD DR 40 MG TAB] 60 tablet 0    Sig: TAKE 1 TABLET BY MOUTH TWICE A DAY     Gastroenterology: Proton Pump Inhibitors Passed - 03/02/2019  1:41 AM      Passed - Valid encounter within last 12 months    Recent Outpatient Visits          7 months ago Essential hypertension   [facility] at [facility], Zoe A, MD   11 months ago Disorder of right rotator cuff   [facility] at [facility] Arlie Solomons, MD   1 year ago Chronic pain of right knee   Primary Care at [facility], Arlie Solomons, MD   1 year ago Encounter for medication monitoring   Primary Care at [facility], Arlie Solomons, MD   1 year ago Chronic prescription opiate use   Primary Care at [facility], Arlie Solomons, MD             Left voicemail for patient to return call to schedule follow up appointment with Dr. Nolon Rod.  30 day courtesy refill  sent in.

## 2019-03-16 ENCOUNTER — Ambulatory Visit (INDEPENDENT_AMBULATORY_CARE_PROVIDER_SITE_OTHER): Payer: Medicare Other | Admitting: Family Medicine

## 2019-03-16 ENCOUNTER — Encounter: Payer: Self-pay | Admitting: Family Medicine

## 2019-03-16 ENCOUNTER — Other Ambulatory Visit: Payer: Self-pay

## 2019-03-16 VITALS — BP 133/72 | HR 70 | Temp 98.3°F | Resp 17 | Ht 74.0 in | Wt 381.6 lb

## 2019-03-16 DIAGNOSIS — M1711 Unilateral primary osteoarthritis, right knee: Secondary | ICD-10-CM | POA: Diagnosis not present

## 2019-03-16 DIAGNOSIS — I10 Essential (primary) hypertension: Secondary | ICD-10-CM

## 2019-03-16 DIAGNOSIS — G894 Chronic pain syndrome: Secondary | ICD-10-CM | POA: Diagnosis not present

## 2019-03-16 DIAGNOSIS — M1A3720 Chronic gout due to renal impairment, left ankle and foot, without tophus (tophi): Secondary | ICD-10-CM

## 2019-03-16 DIAGNOSIS — R7989 Other specified abnormal findings of blood chemistry: Secondary | ICD-10-CM

## 2019-03-16 NOTE — Progress Notes (Signed)
Established Patient Office Visit  Subjective:  Patient ID: Leon Thomas, male    DOB: Apr 18, 1956  Age: 63 y.o. MRN: [id number]  CC:  Chief Complaint  Patient presents with  . discuss meds bp f/u    HPI LOMAN LOGAN presents for   Hypertension: Patient here for follow-up of elevated blood pressure. He is not exercising and is adherent to low salt diet.  Blood pressure is well controlled at home. Cardiac symptoms none. Patient denies chest pain, chest pressure/discomfort, claudication, exertional chest pressure/discomfort, irregular heart beat, lower extremity edema and orthopnea.  Cardiovascular risk factors: hypertension, male gender, obesity (BMI >= 30 kg/m2) and sedentary lifestyle. Use of agents associated with hypertension: none. History of target organ damage: chronic kidney disease. BP Readings from Last 3 Encounters:  03/16/19 133/72  07/21/18 138/82  03/20/18 (!) 112/57   Morbid Obesity He reports that he is cooking in a crock pot He states that he does not eat fried foods He drinks plenty of water daily He also reports that he did the KETO diet but he gained weight He did not get a gout attack while doing KETO DIET Wt Readings from Last 3 Encounters:  03/16/19 (!) 381 lb 9.6 oz (173.1 kg)  07/21/18 (!) 372 lb (168.7 kg)  03/20/18 (!) 362 lb (164.2 kg)   Severe arthritis Pt is being evaluated for a knee replacement Since he did not lose the weight needed to get a knee replacement  He reports that he has not gotten any more pain meds He denies that he was diverting medications but that he ran out He has not heard back from pain management.  Past Medical History:  Diagnosis Date  . Arthritis    knees  . CHF (congestive heart failure) ([place])     " A long time ago"  . Dysrhythmia    04-21-13 Cardioverted at [facility] for A. Fib-Dr. Einar Gip. No further problems  . GI bleed   . Gout   . Hypertension   . Rotator cuff tear, right   . Sleep apnea    cpap nightly,  settings 3    Past Surgical History:  Procedure Laterality Date  . BIOPSY  01/09/2018   Procedure: BIOPSY;  Surgeon: Otis Brace, MD;  Location: [facility];  Service: Gastroenterology;;  . CARDIOVERSION N/A 04/21/2013   Procedure: CARDIOVERSION;  Surgeon: Laverda Page, MD;  Location: [facility] ENDOSCOPY;  Service: Cardiovascular;  Laterality: N/A;  h&p in file- HW  . COLONOSCOPY WITH PROPOFOL N/A 09/04/2013   Procedure: COLONOSCOPY WITH PROPOFOL;  Surgeon: Winfield Cunas., MD;  Location: [facility] ENDOSCOPY;  Service: Endoscopy;  Laterality: N/A;  . ESOPHAGOGASTRODUODENOSCOPY (EGD) WITH PROPOFOL N/A 01/09/2018   Procedure: ESOPHAGOGASTRODUODENOSCOPY (EGD) WITH PROPOFOL;  Surgeon: Otis Brace, MD;  Location: [facility];  Service: Gastroenterology;  Laterality: N/A;  . FOOT SURGERY Left   . JOINT REPLACEMENT Left   . ORIF ANKLE FRACTURE Right   . SHOULDER ARTHROSCOPY WITH ROTATOR CUFF REPAIR AND SUBACROMIAL DECOMPRESSION Right 03/20/2018   Procedure: SHOULDER ARTHROSCOPY WITH ROTATOR CUFF REPAIR AND SUBACROMIAL DECOMPRESSION;  Surgeon: Tania Ade, MD;  Location: [place];  Service: Orthopedics;  Laterality: Right;    Family History  Problem Relation Age of Onset  . Diabetes Mother   . Alzheimer's disease Mother   . Diabetes Father   . Diabetes Brother   . Stroke Sister     Social History   Socioeconomic History  . Marital status: Single    Spouse  name: Not on file  . Number of children: Not on file  . Years of education: Not on file  . Highest education level: 11th grade  Occupational History  . Not on file  Social Needs  . Financial resource strain: Very hard  . Food insecurity    Worry: Often true    Inability: Often true  . Transportation needs    Medical: No    Non-medical: No  Tobacco Use  . Smoking status: Never Smoker  . Smokeless tobacco: Never Used  Substance and Sexual Activity  . Alcohol use: Yes    Alcohol/week: 1.0 standard drinks    Types: 1  Cans of beer per week    Comment: drink occasional beer  . Drug use: No  . Sexual activity: Not on file  Lifestyle  . Physical activity    Days per week: 0 days    Minutes per session: 0 min  . Stress: Very much  Relationships  . Social connections    Talks on phone: More than three times a week    Gets together: More than three times a week    Attends religious service: Never    Active member of club or organization: No    Attends meetings of clubs or organizations: Never    Relationship status: Never married  . Intimate partner violence    Fear of current or ex partner: No    Emotionally abused: No    Physically abused: No    Forced sexual activity: No  Other Topics Concern  . Not on file  Social History Narrative  . Not on file    Outpatient Medications Prior to Visit  Medication Sig Dispense Refill  . allopurinol (ZYLOPRIM) 300 MG tablet Take 1 tablet (300 mg total) by mouth daily. 90 tablet 1  . carvedilol (COREG) 25 MG tablet TAKE 2 TABLETS BY MOUTH EVERY DAY 60 tablet 0  . cloNIDine (CATAPRES - DOSED IN MG/24 HR) 0.2 mg/24hr patch PLACE 1 PATCH (0.2 MG TOTAL) ONTO THE SKIN ONCE A WEEK. 12 patch 1  . docusate sodium (COLACE) 100 MG capsule Take 1 capsule (100 mg total) by mouth daily as needed for mild constipation. 60 capsule 1  . eplerenone (INSPRA) 25 MG tablet Take 1 tablet (25 mg total) by mouth daily. 90 tablet 1  . furosemide (LASIX) 40 MG tablet TAKE 1 TABLET BY MOUTH EVERY DAY AS NEEDED FOR FLUID 30 tablet 0  . methocarbamol (ROBAXIN) 500 MG tablet Take 1 tablet (500 mg total) by mouth 3 (three) times daily. 30 tablet 0  . olmesartan (BENICAR) 40 MG tablet TAKE 1 TABLET BY MOUTH EVERY DAY 90 tablet 0  . Omega-3 Fatty Acids (FISH OIL) 1000 MG CAPS Take 1,000 mg by mouth daily.    . pantoprazole (PROTONIX) 40 MG tablet TAKE 1 TABLET BY MOUTH TWICE A DAY 60 tablet 0  . rivaroxaban (XARELTO) 20 MG TABS tablet Take 1 tablet (20 mg total) by mouth daily. 30 tablet 3    No facility-administered medications prior to visit.     No Known Allergies  ROS Review of Systems Review of Systems  Constitutional: Negative for activity change, appetite change, chills and fever.  HENT: Negative for congestion, nosebleeds, trouble swallowing and voice change.   Respiratory: Negative for cough, shortness of breath and wheezing.   Gastrointestinal: Negative for diarrhea, nausea and vomiting.  Genitourinary: Negative for difficulty urinating, dysuria, flank pain and hematuria.  Musculoskeletal: Negative for back pain, joint swelling  and neck pain.  Neurological: Negative for dizziness, speech difficulty, light-headedness and numbness.  See HPI. All other review of systems negative.     Objective:    Physical Exam  BP 133/72 (BP Location: Left Arm, Patient Position: Sitting, Cuff Size: Large)   Pulse 70   Temp 98.3 F (36.8 C) (Oral)   Resp 17   Ht '6\' 2"'  (1.88 m)   Wt (!) 381 lb 9.6 oz (173.1 kg)   SpO2 95%   BMI 48.99 kg/m  Wt Readings from Last 3 Encounters:  03/16/19 (!) 381 lb 9.6 oz (173.1 kg)  07/21/18 (!) 372 lb (168.7 kg)  03/20/18 (!) 362 lb (164.2 kg)   Physical Exam  Constitutional: Oriented to person, place, and time. Appears well-developed and well-nourished. Morbidly obese. HENT:  Head: Normocephalic and atraumatic.  Eyes: Conjunctivae and EOM are normal.  Cardiovascular: Normal rate, regular rhythm, normal heart sounds and intact distal pulses.  No murmur heard. Pulmonary/Chest: Effort normal and breath sounds normal. No stridor. No respiratory distress. Has no wheezes.  Abdomen: obese, nondistended, normoactive bs, nontender Neurological: Is alert and oriented to person, place, and time.  Skin: Skin is warm. Capillary refill takes less than 2 seconds.  Psychiatric: Has a normal mood and affect. Behavior is normal. Judgment and thought content normal.   Right knee with chronic OA changes  Health Maintenance Due  Topic Date Due  .  Hepatitis C Screening  1956/02/13  . TETANUS/TDAP  10/12/1976    There are no preventive care reminders to display for this patient.  Lab Results  Component Value Date   TSH 5.720 (H) 07/21/2018   Lab Results  Component Value Date   WBC 5.9 02/20/2018   HGB 14.2 02/20/2018   HCT 43.4 02/20/2018   MCV 89.1 02/20/2018   PLT 174 02/20/2018   Lab Results  Component Value Date   NA 139 07/21/2018   K 3.6 07/21/2018   CO2 24 07/21/2018   GLUCOSE 105 (H) 07/21/2018   BUN 16 07/21/2018   CREATININE 1.34 (H) 07/21/2018   BILITOT 1.2 07/21/2018   ALKPHOS 62 07/21/2018   AST 12 07/21/2018   ALT 13 07/21/2018   PROT 7.0 07/21/2018   ALBUMIN 3.9 07/21/2018   CALCIUM 9.1 07/21/2018   ANIONGAP 11 02/20/2018   Lab Results  Component Value Date   CHOL 186 07/21/2018   Lab Results  Component Value Date   HDL 58 07/21/2018   Lab Results  Component Value Date   LDLCALC 100 (H) 07/21/2018   Lab Results  Component Value Date   TRIG 141 07/21/2018   Lab Results  Component Value Date   CHOLHDL 3.2 07/21/2018   Lab Results  Component Value Date   HGBA1C 5.6 07/21/2018      Assessment & Plan:   Problem List Items Addressed This Visit      Musculoskeletal and Integument   Primary osteoarthritis of right knee - continue with Orthopedics     Other   Chronic pain syndrome  - will not refill any opiates, referral was placed for Chronic Pain Med    Other Visit Diagnoses    Essential hypertension    -  Primary Patient's blood pressure is at goal of 139/89 or less. Condition is stable. Continue current medications and treatment plan. I recommend that you exercise for 30-45 minutes 5 days a week. I also recommend a balanced diet with fruits and vegetables every day, lean meats, and little fried foods. The DASH  diet (you can find this online) is a good example of this.    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   Follow-up: No follow-ups on file.    Forrest Moron, MD

## 2019-03-16 NOTE — Patient Instructions (Signed)
° ° ° °  If you have lab work done today you will be contacted with your lab results within the next 2 weeks.  If you have not heard from us then please contact us. The fastest way to get your results is to register for My Chart. ° ° °IF you received an x-ray today, you will receive an invoice from [facility]. Please contact [facility] at [phone] with questions or concerns regarding your invoice.  ° °IF you received labwork today, you will receive an invoice from [facility]. Please contact [facility] at [phone] with questions or concerns regarding your invoice.  ° °Our billing staff will not be able to assist you with questions regarding bills from these companies. ° °You will be contacted with the lab results as soon as they are available. The fastest way to get your results is to activate your My Chart account. Instructions are located on the last page of this paperwork. If you have not heard from us regarding the results in 2 weeks, please contact this office. °  ° ° ° °

## 2019-03-17 LAB — CMP14+EGFR
ALT: 10 IU/L (ref 0–44)
AST: 16 IU/L (ref 0–40)
Albumin/Globulin Ratio: 1.4 (ref 1.2–2.2)
Albumin: 4.4 g/dL (ref 3.8–4.8)
Alkaline Phosphatase: 52 IU/L (ref 39–117)
BUN/Creatinine Ratio: 22 (ref 10–24)
BUN: 36 mg/dL — ABNORMAL HIGH (ref 8–27)
Bilirubin Total: 1.2 mg/dL (ref 0.0–1.2)
CO2: 23 mmol/L (ref 20–29)
Calcium: 9.8 mg/dL (ref 8.6–10.2)
Chloride: 100 mmol/L (ref 96–106)
Creatinine, Ser: 1.62 mg/dL — ABNORMAL HIGH (ref 0.76–1.27)
GFR calc Af Amer: 52 mL/min/{1.73_m2} — ABNORMAL LOW (ref >59)
GFR calc non Af Amer: 45 mL/min/{1.73_m2} — ABNORMAL LOW (ref >59)
Globulin, Total: 3.1 g/dL (ref 1.5–4.5)
Glucose: 95 mg/dL (ref 65–99)
Potassium: 4.8 mmol/L (ref 3.5–5.2)
Sodium: 139 mmol/L (ref 134–144)
Total Protein: 7.5 g/dL (ref 6.0–8.5)

## 2019-03-17 LAB — LIPID PANEL
Chol/HDL Ratio: 3.3 ratio (ref 0.0–5.0)
Cholesterol, Total: 203 mg/dL — ABNORMAL HIGH (ref 100–199)
HDL: 61 mg/dL (ref >39)
LDL Calculated: 126 mg/dL — ABNORMAL HIGH (ref 0–99)
Triglycerides: 81 mg/dL (ref 0–149)
VLDL Cholesterol Cal: 16 mg/dL (ref 5–40)

## 2019-03-17 LAB — TSH+FREE T4
Free T4: 1.18 ng/dL (ref 0.82–1.77)
TSH: 3.65 u[IU]/mL (ref 0.450–4.500)

## 2019-03-25 ENCOUNTER — Other Ambulatory Visit: Payer: Self-pay | Admitting: Family Medicine

## 2019-03-25 DIAGNOSIS — K219 Gastro-esophageal reflux disease without esophagitis: Secondary | ICD-10-CM

## 2019-04-13 ENCOUNTER — Other Ambulatory Visit: Payer: Self-pay | Admitting: Family Medicine

## 2019-04-13 DIAGNOSIS — N182 Chronic kidney disease, stage 2 (mild): Secondary | ICD-10-CM

## 2019-04-13 NOTE — Telephone Encounter (Signed)
Requested medication (s) are due for refill today: yes  Requested medication (s) are on the active medication list:yes  Last refill:  02/21/2019  Future visit scheduled: No  Notes to clinic:  CR high 1.62    Requested Prescriptions  Pending Prescriptions Disp Refills   furosemide (LASIX) 40 MG tablet [Pharmacy Med Name: FUROSEMIDE 40 MG TABLET] 30 tablet 0    Sig: TAKE 1 TABLET BY MOUTH EVERY DAY AS NEEDED FOR FLUID     Cardiovascular:  Diuretics - Loop Failed - 04/13/2019 10:32 AM      Failed - Cr in normal range and within 360 days    Creatinine, Ser  Date Value Ref Range Status  03/16/2019 1.62 (H) 0.76 - 1.27 mg/dL Final         Passed - K in normal range and within 360 days    Potassium  Date Value Ref Range Status  03/16/2019 4.8 3.5 - 5.2 mmol/L Final         Passed - Ca in normal range and within 360 days    Calcium  Date Value Ref Range Status  03/16/2019 9.8 8.6 - 10.2 mg/dL Final         Passed - Na in normal range and within 360 days    Sodium  Date Value Ref Range Status  03/16/2019 139 134 - 144 mmol/L Final         Passed - Last BP in normal range    BP Readings from Last 1 Encounters:  03/16/19 133/72         Passed - Valid encounter within last 6 months    Recent Outpatient Visits          4 weeks ago Essential hypertension   Primary Care at [facility], Arlie Solomons, MD   8 months ago Essential hypertension   Primary Care at [facility], Arlie Solomons, MD   1 year ago Disorder of right rotator cuff   Primary Care at [facility], Arlie Solomons, MD   1 year ago Chronic pain of right knee   Primary Care at [facility], Arlie Solomons, MD   1 year ago Encounter for medication monitoring   Primary Care at [facility], Arlie Solomons, MD

## 2019-04-19 ENCOUNTER — Other Ambulatory Visit: Payer: Self-pay | Admitting: Family Medicine

## 2019-04-19 DIAGNOSIS — K219 Gastro-esophageal reflux disease without esophagitis: Secondary | ICD-10-CM

## 2019-05-09 ENCOUNTER — Other Ambulatory Visit: Payer: Self-pay | Admitting: Family Medicine

## 2019-05-09 DIAGNOSIS — N182 Chronic kidney disease, stage 2 (mild): Secondary | ICD-10-CM

## 2019-05-11 ENCOUNTER — Encounter: Payer: Self-pay | Admitting: Cardiology

## 2019-05-11 ENCOUNTER — Other Ambulatory Visit: Payer: Self-pay | Admitting: Gastroenterology

## 2019-05-15 ENCOUNTER — Other Ambulatory Visit: Payer: Self-pay | Admitting: Family Medicine

## 2019-05-15 DIAGNOSIS — K219 Gastro-esophageal reflux disease without esophagitis: Secondary | ICD-10-CM

## 2019-05-25 ENCOUNTER — Other Ambulatory Visit: Payer: Self-pay | Admitting: Family Medicine

## 2019-05-25 ENCOUNTER — Other Ambulatory Visit: Payer: Self-pay | Admitting: Cardiology

## 2019-05-25 NOTE — Telephone Encounter (Signed)
Medication: rivaroxaban (XARELTO) 20 MG TABS tablet [[id number]]   Has the patient contacted their pharmacy? Yes  (Agent: If no, request that the patient contact the pharmacy for the refill.) (Agent: If yes, when and what did the pharmacy advise?)  Preferred Pharmacy (with phone number or street name): [id number] [place], [place] [phone] (Phone) [phone] (Fax)    Agent: Please be advised that RX refills may take up to 3 business days. We ask that you follow-up with your pharmacy.

## 2019-05-25 NOTE — Telephone Encounter (Signed)
Requested medication (s) are due for refill today: yes Requested medication (s) are on the active medication list: yes  Last refill:  07/21/2018  Future visit scheduled: no  Notes to clinic:  Hematology anticoagulants rivaroxaban failed  Requested Prescriptions  Pending Prescriptions Disp Refills   rivaroxaban (XARELTO) 20 MG TABS tablet 30 tablet 3    Sig: Take 1 tablet (20 mg total) by mouth daily.     Hematology: Anticoagulants - rivaroxaban Failed - 05/25/2019  4:54 PM      Failed - Cr in normal range and within 360 days    Creatinine, Ser  Date Value Ref Range Status  03/16/2019 1.62 (H) 0.76 - 1.27 mg/dL Final         Failed - HCT in normal range and within 360 days    HCT  Date Value Ref Range Status  02/20/2018 43.4 39.0 - 52.0 % Final   Hematocrit  Date Value Ref Range Status  05/13/2017 42.8 37.5 - 51.0 % Final         Failed - HGB in normal range and within 360 days    Hemoglobin  Date Value Ref Range Status  02/20/2018 14.2 13.0 - 17.0 g/dL Final  05/13/2017 14.0 13.0 - 17.7 g/dL Final         Failed - PLT in normal range and within 360 days    Platelets  Date Value Ref Range Status  02/20/2018 174 150 - 400 K/uL Final    Comment:    Performed at [facility], [place] [address], [place], [place] [address]  05/13/2017 151 150 - 379 x10E3/uL Final         Passed - ALT in normal range and within 180 days    ALT  Date Value Ref Range Status  03/16/2019 10 0 - 44 IU/L Final         Passed - AST in normal range and within 180 days    AST  Date Value Ref Range Status  03/16/2019 16 0 - 40 IU/L Final         Passed - Valid encounter within last 12 months    Recent Outpatient Visits          2 months ago Essential hypertension   Primary Care at [facility], Arlie Solomons, MD   10 months ago Essential hypertension   Primary Care at [facility], [place], MD   1 year ago Disorder of right rotator cuff   Primary Care at [facility], Arlie Solomons, MD    1 year ago Chronic pain of right knee   Primary Care at [facility], Arlie Solomons, MD   1 year ago Encounter for medication monitoring   Primary Care at [facility], Arlie Solomons, MD      Future Appointments            In 2 weeks [name] Prows, MD [facility]

## 2019-05-28 ENCOUNTER — Other Ambulatory Visit: Payer: Self-pay | Admitting: Family Medicine

## 2019-05-28 ENCOUNTER — Encounter (HOSPITAL_COMMUNITY): Payer: Self-pay | Admitting: *Deleted

## 2019-05-28 MED ORDER — RIVAROXABAN 20 MG PO TABS: 20.0000 mg | ORAL_TABLET | Freq: Every day | ORAL | 3 refills | Status: DC

## 2019-05-28 NOTE — Progress Notes (Signed)
Preop instructions for:      Leon Thomas                   Date of Birth      [birth year]                      Date of Procedure:   06/01/2019    Doctor:  Dr Oletta Lamas Time to arrive at [facility]: Report to: Admitting at 0730 on 06/01/2019  Procedure:  colonoscopy   Do not eat or drink past midnight the night before your procedure.    Take these morning medications only with sips of water. Coreg, eplerenone, olmesartan. Patient has been out of xarelto for 2 weeks. Instructed not to restart until after the procedure 06/01/2019  Note: No Insulin or Diabetic meds should be given or taken the morning of the procedure!   Follow directions for your bowel prep as per Dr Oletta Lamas office.      Bring Insurance card and picture ID Leave all jewelry and other valuables at place where living( no metal or rings to be worn) No contact lens Women-no make-up, no lotions,perfumes,powders Men-no colognes,lotions  Any questions day of procedure,call  Endoscopy [phone]   To get covid tested 05/29/2019. Unable to 05/28/2019 as sites were closed due to weather.

## 2019-05-29 ENCOUNTER — Other Ambulatory Visit (HOSPITAL_COMMUNITY)
Admission: RE | Admit: 2019-05-29 | Discharge: 2019-05-29 | Disposition: A | Discharge status: Home / Self Care | Payer: Medicare Other | Source: Ambulatory Visit | Attending: Gastroenterology | Admitting: Gastroenterology

## 2019-05-29 DIAGNOSIS — Z20828 Contact with and (suspected) exposure to other viral communicable diseases: Secondary | ICD-10-CM | POA: Insufficient documentation

## 2019-05-29 DIAGNOSIS — Z01812 Encounter for preprocedural laboratory examination: Secondary | ICD-10-CM | POA: Insufficient documentation

## 2019-05-30 LAB — NOVEL CORONAVIRUS, NAA (HOSP ORDER, SEND-OUT TO REF LAB; TAT 18-24 HRS): SARS-CoV-2, NAA: NOT DETECTED

## 2019-05-30 NOTE — Anesthesia Preprocedure Evaluation (Addendum)
Anesthesia Evaluation  Patient identified by MRN, date of birth, ID band Patient awake    Reviewed: Allergy & Precautions, NPO status , Patient's Chart, lab work & pertinent test results  History of Anesthesia Complications Negative for: history of anesthetic complications  Airway Mallampati: II  TM Distance: >3 FB Neck ROM: Full    Dental  (+) Missing,    Pulmonary sleep apnea and Continuous Positive Airway Pressure Ventilation ,    Pulmonary exam normal        Cardiovascular hypertension, Pt. on medications +CHF  Normal cardiovascular exam+ dysrhythmias Atrial Fibrillation      Neuro/Psych negative neurological ROS  negative psych ROS   GI/Hepatic negative GI ROS, Neg liver ROS,   Endo/Other  Morbid obesity  Renal/GU negative Renal ROS  negative genitourinary   Musculoskeletal  (+) Arthritis ,   Abdominal   Peds  Hematology negative hematology ROS (+)   Anesthesia Other Findings Day of surgery medications reviewed with patient.  Reproductive/Obstetrics negative OB ROS                            Anesthesia Physical Anesthesia Plan  ASA: III  Anesthesia Plan: MAC   Post-op Pain Management:    Induction:   PONV Risk Score and Plan: 1 and Treatment may vary due to age or medical condition and Propofol infusion  Airway Management Planned: Natural Airway and Nasal Cannula  Additional Equipment: None  Intra-op Plan:   Post-operative Plan:   Informed Consent: I have reviewed the patients History and Physical, chart, labs and discussed the procedure including the risks, benefits and alternatives for the proposed anesthesia with the patient or authorized representative who has indicated his/her understanding and acceptance.       Plan Discussed with: CRNA  Anesthesia Plan Comments:        Anesthesia Quick Evaluation

## 2019-06-01 ENCOUNTER — Ambulatory Visit (HOSPITAL_COMMUNITY)
Admission: RE | Admit: 2019-06-01 | Discharge: 2019-06-01 | Disposition: A | Discharge status: Home / Self Care | Payer: Medicare Other | Attending: Gastroenterology | Admitting: Gastroenterology

## 2019-06-01 ENCOUNTER — Encounter (HOSPITAL_COMMUNITY): Payer: Self-pay

## 2019-06-01 ENCOUNTER — Encounter (HOSPITAL_COMMUNITY)
Admission: RE | Disposition: A | Discharge status: Home / Self Care | Payer: Self-pay | Source: Home / Self Care | Attending: Gastroenterology

## 2019-06-01 ENCOUNTER — Ambulatory Visit (HOSPITAL_COMMUNITY): Payer: Medicare Other | Admitting: Anesthesiology

## 2019-06-01 ENCOUNTER — Other Ambulatory Visit: Payer: Self-pay

## 2019-06-01 DIAGNOSIS — I509 Heart failure, unspecified: Secondary | ICD-10-CM | POA: Diagnosis not present

## 2019-06-01 DIAGNOSIS — Z79899 Other long term (current) drug therapy: Secondary | ICD-10-CM | POA: Diagnosis not present

## 2019-06-01 DIAGNOSIS — Z6841 Body Mass Index (BMI) 40.0 and over, adult: Secondary | ICD-10-CM | POA: Insufficient documentation

## 2019-06-01 DIAGNOSIS — G4733 Obstructive sleep apnea (adult) (pediatric): Secondary | ICD-10-CM | POA: Diagnosis not present

## 2019-06-01 DIAGNOSIS — Z7901 Long term (current) use of anticoagulants: Secondary | ICD-10-CM | POA: Diagnosis not present

## 2019-06-01 DIAGNOSIS — Z1211 Encounter for screening for malignant neoplasm of colon: Secondary | ICD-10-CM | POA: Diagnosis not present

## 2019-06-01 DIAGNOSIS — Z86010 Personal history of colonic polyps: Secondary | ICD-10-CM | POA: Diagnosis present

## 2019-06-01 DIAGNOSIS — K5730 Diverticulosis of large intestine without perforation or abscess without bleeding: Secondary | ICD-10-CM | POA: Diagnosis not present

## 2019-06-01 DIAGNOSIS — G894 Chronic pain syndrome: Secondary | ICD-10-CM | POA: Diagnosis not present

## 2019-06-01 DIAGNOSIS — M1990 Unspecified osteoarthritis, unspecified site: Secondary | ICD-10-CM | POA: Insufficient documentation

## 2019-06-01 DIAGNOSIS — M109 Gout, unspecified: Secondary | ICD-10-CM | POA: Diagnosis not present

## 2019-06-01 DIAGNOSIS — I480 Paroxysmal atrial fibrillation: Secondary | ICD-10-CM | POA: Insufficient documentation

## 2019-06-01 DIAGNOSIS — I110 Hypertensive heart disease with heart failure: Secondary | ICD-10-CM | POA: Insufficient documentation

## 2019-06-01 HISTORY — PX: COLONOSCOPY WITH PROPOFOL: SHX5780

## 2019-06-01 SURGERY — COLONOSCOPY WITH PROPOFOL
Anesthesia: Monitor Anesthesia Care

## 2019-06-01 MED ORDER — SODIUM CHLORIDE 0.9 % IV SOLN: INTRAVENOUS | Status: DC

## 2019-06-01 MED ORDER — PROPOFOL 500 MG/50ML IV EMUL
INTRAVENOUS | Status: AC
  Filled 2019-06-01: qty 50

## 2019-06-01 MED ORDER — LACTATED RINGERS IV SOLN
INTRAVENOUS | Status: DC
  Administered 2019-06-01: 1000 mL via INTRAVENOUS

## 2019-06-01 MED ORDER — PROPOFOL 500 MG/50ML IV EMUL
INTRAVENOUS | Status: DC | PRN
  Administered 2019-06-01: 150 ug/kg/min via INTRAVENOUS

## 2019-06-01 MED ORDER — PROPOFOL 10 MG/ML IV BOLUS
INTRAVENOUS | Status: DC | PRN
  Administered 2019-06-01: 30 mg via INTRAVENOUS

## 2019-06-01 MED ORDER — ONDANSETRON HCL 4 MG/2ML IJ SOLN
INTRAMUSCULAR | Status: DC | PRN
  Administered 2019-06-01: 4 mg via INTRAVENOUS

## 2019-06-01 SURGICAL SUPPLY — 21 items

## 2019-06-01 NOTE — Discharge Instructions (Addendum)
YOU HAD AN ENDOSCOPIC PROCEDURE TODAY: Refer to the procedure report and other information in the discharge instructions given to you for any specific questions about what was found during the examination. If this information does not answer your questions, please call [facility] office at [phone] to clarify.   YOU SHOULD EXPECT: Some feelings of bloating in the abdomen. Passage of more gas than usual. Walking can help get rid of the air that was put into your GI tract during the procedure and reduce the bloating. If you had a lower endoscopy (such as a colonoscopy or flexible sigmoidoscopy) you may notice spotting of blood in your stool or on the toilet paper. Some abdominal soreness may be present for a day or two, also.  DIET: Your first meal following the procedure should be a light meal and then it is ok to progress to your normal diet. A half-sandwich or bowl of soup is an example of a good first meal. Heavy or fried foods are harder to digest and may make you feel nauseous or bloated. Drink plenty of fluids but you should avoid alcoholic beverages for 24 hours. If you had a esophageal dilation, please see attached instructions for diet.   ACTIVITY: Your care partner should take you home directly after the procedure. You should plan to take it easy, moving slowly for the rest of the day. You can resume normal activity the day after the procedure however YOU SHOULD NOT DRIVE, use power tools, machinery or perform tasks that involve climbing or major physical exertion for 24 hours (because of the sedation medicines used during the test).   SYMPTOMS TO REPORT IMMEDIATELY: A gastroenterologist can be reached at any hour. Please call [phone]  for any of the following symptoms:   Following lower endoscopy (colonoscopy, flexible sigmoidoscopy) Excessive amounts of blood in the stool  Significant tenderness, worsening of abdominal pains  Swelling of the abdomen that is new, acute  Fever of 100  or higher   Following upper endoscopy (EGD, EUS, ERCP, esophageal dilation) Vomiting of blood or coffee ground material  New, significant abdominal pain  New, significant chest pain or pain under the shoulder blades  Painful or persistently difficult swallowing  New shortness of breath  Black, tarry-looking or red, bloody stools  FOLLOW UP:  If any biopsies were taken you will be contacted by phone or by letter within the next 1-3 weeks. Call [phone]  if you have not heard about the biopsies in 3 weeks.  Please also call with any specific questions about appointments or follow up tests.You should repeat the colonoscopy in the next few years.  Our office will notify you by mail when it is time.  Please call at [phone] for any problems

## 2019-06-01 NOTE — H&P (Signed)
Subjective:   Patient is a 63 y.o. male presents with history of colon polyp removed in 2010 in [place].  Patient did not have a follow-up colonoscopy.  This procedure is done for surveillance.  He has PAF and is anticoagulated on Xarelto but has not taken this medication for about 10 days.. Procedure including risks and benefits discussed in office.  Patient Active Problem List   Diagnosis Date Noted  . Chronic gastritis 01/28/2018  . GI bleed 01/07/2018  . Primary osteoarthritis of right knee 12/12/2017  . Chronic prescription opiate use 12/12/2017  . Chronic pain syndrome 12/12/2017  . Atrial fibrillation (HCC) 05/13/2017  . Gout, arthritis 05/13/2017  . Osteoarthritis of left knee 05/13/2017  . Morbid obesity (HCC) 05/13/2017  . Chronic anticoagulation 05/13/2017  . History of knee joint replacement 05/13/2017  . Chronic use of opiate drugs therapeutic purposes 05/13/2017  . Anosmia 06/20/2016  . OSA (obstructive sleep apnea) 06/20/2016   Past Medical History:  Diagnosis Date  . Arthritis    knees  . CHF (congestive heart failure) (HCC)     " A long time ago"  . Dysrhythmia    04-21-13 Card[facility]verted at [facility] for A. Fib-Dr. Jacinto Halim. No further problems  . GI bleed   . Gout   . Hypertension   . Rotator cuff tear, right   . Sleep apnea    cpap nightly, settings 3    Past Surgical History:  Procedure Laterality Date  . BIOPSY  01/09/2018   Procedure: BIOPSY;  Surgeon: Kathi Der, MD;  Location: [facility];  Service: Gastroenterology;;  . CARDIOVERSION N/A 04/21/2013   Procedure: CARDIOVERSION;  Surgeon: [name], MD;  Location: [facility] ENDOSCOPY;  Service: Cardiovascular;  Laterality: N/A;  h&p in file- HW  . COLONOSCOPY WITH PROPOFOL N/A 09/04/2013   Procedure: COLONOSCOPY WITH PROPOFOL;  Surgeon: Vertell Novak., MD;  Location: [facility] ENDOSCOPY;  Service: Endoscopy;  Laterality: N/A;  . ESOPHAGOGASTRODUODENOSCOPY (EGD) WITH PROPOFOL N/A 01/09/2018   Procedure: ESOPHAGOGASTRODUODENOSCOPY (EGD) WITH PROPOFOL;  Surgeon: Kathi Der, MD;  Location: [facility] ENDOSCOPY;  Service: Gastroenterology;  Laterality: N/A;  . FOOT SURGERY Left   . JOINT REPLACEMENT Left   . ORIF ANKLE FRACTURE Right   . SHOULDER ARTHROSCOPY WITH ROTATOR CUFF REPAIR AND SUBACROMIAL DECOMPRESSION Right 03/20/2018   Procedure: SHOULDER ARTHROSCOPY WITH ROTATOR CUFF REPAIR AND SUBACROMIAL DECOMPRESSION;  Surgeon: [name], MD;  Location: [facility] OR;  Service: Orthopedics;  Laterality: Right;    Medications Prior to Admission  Medication Sig Dispense Refill Last Dose  . allopurinol (ZYLOPRIM) 300 MG tablet Take 1 tablet (300 mg total) by mouth daily. 90 tablet 1 06/01/2019 at Unknown time  . carvedilol (COREG) 25 MG tablet TAKE 2 TABLETS BY MOUTH EVERY DAY (Patient taking differently: Take 50 mg by mouth 2 (two) times daily with a meal. ) 60 tablet 0 06/01/2019 at Unknown time  . cyclobenzaprine (FLEXERIL) 10 MG tablet Take 10 mg by mouth 3 (three) times daily as needed for muscle spasms.   Past Month at Unknown time  . eplerenone (INSPRA) 25 MG tablet Take 1 tablet (25 mg total) by mouth daily. 90 tablet 1 06/01/2019 at Unknown time  . furosemide (LASIX) 40 MG tablet TAKE 1 TABLET BY MOUTH EVERY DAY AS NEEDED FOR FLUID (Patient taking differently: Take 40 mg by mouth as needed for fluid. ) 90 tablet 0 05/31/2019 at Unknown time  . HYDROcodone-acetaminophen (NORCO) 10-325 MG tablet Take 1 tablet by mouth 3 (three) times daily  as needed.   Past Week at Unknown time  . olmesartan (BENICAR) 40 MG tablet TAKE 1 TABLET BY MOUTH EVERY DAY (Patient taking differently: Take 40 mg by mouth daily. ) 90 tablet 0 06/01/2019 at Unknown time  . pantoprazole (PROTONIX) 40 MG tablet TAKE 1 TABLET BY MOUTH TWICE A DAY (Patient taking differently: Take 40 mg by mouth daily. ) 60 tablet 0 05/31/2019 at Unknown time  . cloNIDine (CATAPRES - DOSED IN MG/24 HR) 0.2 mg/24hr patch PLACE 1 PATCH (0.2 MG  TOTAL) ONTO THE SKIN ONCE A WEEK. 12 patch 1 Unknown at Unknown time  . docusate sodium (COLACE) 100 MG capsule Take 1 capsule (100 mg total) by mouth daily as needed for mild constipation. (Patient not taking: Reported on 05/25/2019) 60 capsule 1 Not Taking at Unknown time  . methocarbamol (ROBAXIN) 500 MG tablet Take 1 tablet (500 mg total) by mouth 3 (three) times daily. (Patient not taking: Reported on 05/25/2019) 30 tablet 0 Not Taking at Unknown time  . rivaroxaban (XARELTO) 20 MG TABS tablet Take 1 tablet (20 mg total) by mouth daily. 30 tablet 3 05/18/2019   No Known Allergies  Social History   Tobacco Use  . Smoking status: Never Smoker  . Smokeless tobacco: Never Used  Substance Use Topics  . Alcohol use: Yes    Alcohol/week: 1.0 standard drinks    Types: 1 Cans of beer per week    Comment: drink occasional beer    Family History  Problem Relation Age of Onset  . Diabetes Mother   . Alzheimer's disease Mother   . Diabetes Father   . Diabetes Brother   . Stroke Sister      Objective:   Patient Vitals for the past 8 hrs:  BP Temp Temp src Pulse Resp SpO2 Height Weight  06/01/19 0819 (!) 157/80 98.6 F (37 C) Oral 66 16 96 % 6\' 2"  (1.88 m) (!) 163.3 kg   No intake/output data recorded. No intake/output data recorded.   See MD Preop evaluation      Assessment:   1.  History of colon polyps 2.  PAF.  Has been off of Xarelto now for approximately 10 days 3.  Obesity  Plan:   We will proceed with colonoscopy as we have discussed in the office.  We will start his Xarelto back depending on whether or not polyps were removed.

## 2019-06-01 NOTE — Anesthesia Procedure Notes (Signed)
Procedure Name: MAC Date/Time: 06/01/2019 8:56 AM Performed by: Maxwell Caul, CRNA Pre-anesthesia Checklist: Patient identified, Emergency Drugs available, Suction available and Patient being monitored Oxygen Delivery Method: Simple face mask

## 2019-06-01 NOTE — Transfer of Care (Signed)
Immediate Anesthesia Transfer of Care Note  Patient: SAAHIR PRUDE  Procedure(s) Performed: COLONOSCOPY WITH PROPOFOL (N/A )  Patient Location: PACU  Anesthesia Type:MAC  Level of Consciousness: awake, alert  and oriented  Airway & Oxygen Therapy: Patient Spontanous Breathing and Patient connected to face mask oxygen  Post-op Assessment: Report given to RN and Post -op Vital signs reviewed and stable  Post vital signs: Reviewed and stable  Last Vitals:  Vitals Value Taken Time  BP    Temp    Pulse 77 06/01/19 0934  Resp 13 06/01/19 0934  SpO2 98 % 06/01/19 0934  Vitals shown include unvalidated device data.  Last Pain:  Vitals:   06/01/19 0819  TempSrc: Oral  PainSc: 0-No pain         Complications: No apparent anesthesia complications

## 2019-06-01 NOTE — Op Note (Signed)
[facility] Patient Name: Leon Thomas Procedure Date: 06/01/2019 MRN: [id number] Attending MD: Nancy Fetter Dr., MD Date of Birth: [birth year] CSN: [id number] Age: 63 Admit Type: Inpatient Procedure:                Colonoscopy Indications:              High risk colon cancer surveillance: Personal                            history of colonic polyps, Last colonoscopy: 2010 Providers:                Joyice Faster. Emily Massar Dr., MD, Carmie End, RN,                            Lina Sar, Technician, Virgia Land, CRNA Referring MD:             Arlie Solomons Cristi Loron, MD Medicines:                Monitored Anesthesia Care Complications:            No immediate complications. Estimated Blood Loss:     Estimated blood loss: none. Procedure:                Pre-Anesthesia Assessment:                           - Prior to the procedure, a History and Physical                            was performed, and patient medications and                            allergies were reviewed. The patient's tolerance of                            previous anesthesia was also reviewed. The risks                            and benefits of the procedure and the sedation                            options and risks were discussed with the patient.                            All questions were answered, and informed consent                            was obtained. Prior Anticoagulants: The patient has                            taken Xarelto (rivaroxaban), last dose was 10 days                            prior to procedure. ASA Grade Assessment: III - A  patient with severe systemic disease. After                            reviewing the risks and benefits, the patient was                            deemed in satisfactory condition to undergo the                            procedure.                           After obtaining informed consent, the  colonoscope                            was passed under direct vision. Throughout the                            procedure, the patient's blood pressure, pulse, and                            oxygen saturations were monitored continuously. The                            CF-HQ190L ([id number]) Olympus colonoscope was                            introduced through the anus and advanced to the the                            cecum, identified by appendiceal orifice and                            ileocecal valve. The colonoscopy was performed                            without difficulty. The patient tolerated the                            procedure well. The quality of the bowel                            preparation was good. The ileocecal valve,                            appendiceal orifice, and rectum were photographed. Scope In: 9:05:46 AM Scope Out: 9:27:39 AM Scope Withdrawal Time: 0 hours 11 minutes 58 seconds  Total Procedure Duration: 0 hours 21 minutes 53 seconds  Findings:      The perianal and digital rectal examinations were normal.      There is no endoscopic evidence of bleeding, mass, polyps or tumor in       the entire colon.      Scattered large-mouthed diverticula were found in the sigmoid colon and       descending colon.  The retroflexed view of the distal rectum and anal verge was normal and       showed no anal or rectal abnormalities. Impression:               - Diverticulosis in the sigmoid colon and in the                            descending colon.                           - The distal rectum and anal verge are normal on                            retroflexion view.                           - No specimens collected.                           - Personal history of colonic polyps. Moderate Sedation:      MAC by anesthesia Recommendation:           - Patient has a contact number available for                            emergencies. The signs and symptoms of  potential                            delayed complications were discussed with the                            patient. Return to normal activities tomorrow.                            Written discharge instructions were provided to the                            patient.                           - Resume previous diet.                           - Resume Xarelto (rivaroxaban) at prior dose today.                           - Repeat colonoscopy in 5 years for surveillance. Procedure Code(s):        --- Professional ---                           [phone], Colonoscopy, flexible; diagnostic, including                            collection of specimen(s) by brushing or washing,                            when  performed (separate procedure) Diagnosis Code(s):        --- Professional ---                           Z86.010, Personal history of colonic polyps CPT copyright 2019 [employer]. All rights reserved. The codes documented in this report are preliminary and upon coder review may  be revised to meet current compliance requirements. Nancy Fetter Dr., MD 06/01/2019 9:42:46 AM This report has been signed electronically. Number of Addenda: 0

## 2019-06-01 NOTE — Anesthesia Postprocedure Evaluation (Signed)
Anesthesia Post Note  Patient: Leon Thomas  Procedure(s) Performed: COLONOSCOPY WITH PROPOFOL (N/A )     Patient location during evaluation: PACU Anesthesia Type: MAC Level of consciousness: awake and alert and oriented Pain management: pain level controlled Vital Signs Assessment: post-procedure vital signs reviewed and stable Respiratory status: spontaneous breathing, nonlabored ventilation and respiratory function stable Cardiovascular status: blood pressure returned to baseline Postop Assessment: no apparent nausea or vomiting Anesthetic complications: no    Last Vitals:  Vitals:   06/01/19 0940 06/01/19 0950  BP: (!) 145/91 (!) 149/88  Pulse: 73 (!) 58  Resp: 20 16  Temp:    SpO2: 95% 93%    Last Pain:  Vitals:   06/01/19 0935  TempSrc: Temporal  PainSc: 0-No pain                 Brennan Bailey

## 2019-06-03 ENCOUNTER — Encounter (HOSPITAL_COMMUNITY): Payer: Self-pay | Admitting: Gastroenterology

## 2019-06-09 ENCOUNTER — Ambulatory Visit (INDEPENDENT_AMBULATORY_CARE_PROVIDER_SITE_OTHER): Payer: Medicare Other | Admitting: Cardiology

## 2019-06-09 ENCOUNTER — Ambulatory Visit: Payer: Self-pay | Admitting: Cardiology

## 2019-06-09 ENCOUNTER — Other Ambulatory Visit: Payer: Self-pay

## 2019-06-09 ENCOUNTER — Encounter: Payer: Self-pay | Admitting: Cardiology

## 2019-06-09 VITALS — BP 98/73 | HR 64 | Ht 74.0 in | Wt 372.4 lb

## 2019-06-09 DIAGNOSIS — R0609 Other forms of dyspnea: Secondary | ICD-10-CM

## 2019-06-09 DIAGNOSIS — I10 Essential (primary) hypertension: Secondary | ICD-10-CM

## 2019-06-09 DIAGNOSIS — R0600 Dyspnea, unspecified: Secondary | ICD-10-CM | POA: Diagnosis not present

## 2019-06-09 DIAGNOSIS — I480 Paroxysmal atrial fibrillation: Secondary | ICD-10-CM | POA: Diagnosis not present

## 2019-06-09 NOTE — Progress Notes (Signed)
Primary Physician:  Forrest Moron, MD   Patient ID: Leon Thomas, male    DOB: May 27, 1956, 63 y.o.   MRN: [id number]  Subjective:    Chief Complaint  Patient presents with  . Atrial Fibrillation  . Follow-up    HPI: Leon Thomas  is a 63 y.o. male  with morbid obesity, history of paroxysmal atrial fibrillation, nonischemic cardiomyopathy with moderate LV systolic dysfunction EF 65-53% in July of 2014, but normalized with hypertension treatment by repeat echo in July 2015. He has maintained sinus rhythm since cardioversion on 04/21/2013.  Patient was last seen by [place] in 2018, he has been lost to follow-up.  Patient presently doing well, using CPAP on a regular basis. Denies chest pain or palpitations.Tolerating anticoagulation without any bleeding diathesis.  He does have dyspnea on exertion if he over exerts himself. Also has chronic leg edema that he uses lasix on a every other day basis.  He is being followed by [facility] to help with weight loss. He is hoping to have right knee replacement in the near future, but states that surgery cannot be performed until he gets his weight down to 280 lbs.  Past Medical History:  Diagnosis Date  . Arthritis    knees  . CHF (congestive heart failure) ([place])     " A long time ago"  . Dysrhythmia    04-21-13 Cardioverted at [facility] for A. Fib-Dr. Einar Gip. No further problems  . GI bleed   . Gout   . Hypertension   . Rotator cuff tear, right   . Sleep apnea    cpap nightly, settings 3    Past Surgical History:  Procedure Laterality Date  . BIOPSY  01/09/2018   Procedure: BIOPSY;  Surgeon: Otis Brace, MD;  Location: [facility];  Service: Gastroenterology;;  . CARDIOVERSION N/A 04/21/2013   Procedure: CARDIOVERSION;  Surgeon: Laverda Page, MD;  Location: [facility] ENDOSCOPY;  Service: Cardiovascular;  Laterality: N/A;  h&p in file- HW  . COLONOSCOPY WITH PROPOFOL N/A 09/04/2013   Procedure: COLONOSCOPY WITH PROPOFOL;   Surgeon: Winfield Cunas., MD;  Location: [facility] ENDOSCOPY;  Service: Endoscopy;  Laterality: N/A;  . COLONOSCOPY WITH PROPOFOL N/A 06/01/2019   Procedure: COLONOSCOPY WITH PROPOFOL;  Surgeon: Laurence Spates, MD;  Location: [facility] ENDOSCOPY;  Service: Endoscopy;  Laterality: N/A;  . ESOPHAGOGASTRODUODENOSCOPY (EGD) WITH PROPOFOL N/A 01/09/2018   Procedure: ESOPHAGOGASTRODUODENOSCOPY (EGD) WITH PROPOFOL;  Surgeon: Otis Brace, MD;  Location: [facility] ENDOSCOPY;  Service: Gastroenterology;  Laterality: N/A;  . FOOT SURGERY Left   . JOINT REPLACEMENT Left   . ORIF ANKLE FRACTURE Right   . SHOULDER ARTHROSCOPY WITH ROTATOR CUFF REPAIR AND SUBACROMIAL DECOMPRESSION Right 03/20/2018   Procedure: SHOULDER ARTHROSCOPY WITH ROTATOR CUFF REPAIR AND SUBACROMIAL DECOMPRESSION;  Surgeon: Tania Ade, MD;  Location: [place];  Service: Orthopedics;  Laterality: Right;    Social History   Socioeconomic History  . Marital status: Single    Spouse name: Not on file  . Number of children: 6  . Years of education: Not on file  . Highest education level: 11th grade  Occupational History  . Not on file  Social Needs  . Financial resource strain: Very hard  . Food insecurity    Worry: Often true    Inability: Often true  . Transportation needs    Medical: No    Non-medical: No  Tobacco Use  . Smoking status: Never Smoker  . Smokeless tobacco: Never Used  Substance and Sexual Activity  . Alcohol use: Yes    Alcohol/week: 1.0 standard drinks    Types: 1 Cans of beer per week    Comment: drink occasional beer  . Drug use: No  . Sexual activity: Not on file  Lifestyle  . Physical activity    Days per week: 0 days    Minutes per session: 0 min  . Stress: Very much  Relationships  . Social connections    Talks on phone: More than three times a week    Gets together: More than three times a week    Attends religious service: Never    Active member of club or organization: No    Attends meetings of  clubs or organizations: Never    Relationship status: Never married  . Intimate partner violence    Fear of current or ex partner: No    Emotionally abused: No    Physically abused: No    Forced sexual activity: No  Other Topics Concern  . Not on file  Social History Narrative  . Not on file    Review of Systems  Constitution: Negative for decreased appetite, malaise/fatigue, weight gain and weight loss.  Eyes: Negative for visual disturbance.  Cardiovascular: Negative for chest pain, claudication, dyspnea on exertion, leg swelling, orthopnea, palpitations and syncope.  Respiratory: Negative for hemoptysis and wheezing.   Endocrine: Negative for cold intolerance and heat intolerance.  Hematologic/Lymphatic: Does not bruise/bleed easily.  Skin: Negative for nail changes.  Musculoskeletal: Positive for back pain and joint pain (right knee). Negative for muscle weakness and myalgias.  Gastrointestinal: Negative for abdominal pain, change in bowel habit, constipation, nausea and vomiting.  Neurological: Negative for difficulty with concentration, dizziness, focal weakness and headaches.  Psychiatric/Behavioral: Negative for altered mental status and suicidal ideas.  All other systems reviewed and are negative.     Objective:  Blood pressure 98/73, pulse 64, height _0  (1.88 m), weight (!) 372 lb 6.4 oz (168.9 kg), SpO2 96 %. Body mass index is 47.81 kg/m.    Physical Exam  Constitutional: He is oriented to person, place, and time. Vital signs are normal. He appears well-developed and well-nourished.  Muscular, well built and morbidly obese  HENT:  Head: Normocephalic and atraumatic.  Neck: Normal range of motion.  Cardiovascular: Normal rate, regular rhythm, normal heart sounds and intact distal pulses.  Pulses:      Femoral pulses are 1+ on the right side and 1+ on the left side.      Popliteal pulses are 1+ on the right side and 1+ on the left side.       Dorsalis pedis  pulses are 2+ on the right side and 2+ on the left side.       Posterior tibial pulses are 2+ on the right side and 2+ on the left side.  No edema  Pulmonary/Chest: Effort normal and breath sounds normal. No accessory muscle usage. No respiratory distress.  Abdominal: Soft. Bowel sounds are normal.  Musculoskeletal: Normal range of motion.  Neurological: He is alert and oriented to person, place, and time.  Skin: Skin is warm and dry.  Vitals reviewed.  Radiology: No results found.  Laboratory examination:    CMP Latest Ref Rng & Units 03/16/2019 07/21/2018 02/20/2018  Glucose 65 - 99 mg/dL 95 105(H) 95  BUN 8 - 27 mg/dL 36(H) 16 17  Creatinine 0.76 - 1.27 mg/dL 1.62(H) 1.34(H) 1.21  Sodium 134 - 144 mmol/L 139 139 139  Potassium 3.5 - 5.2 mmol/L 4.8 3.6 4.1  Chloride 96 - 106 mmol/L 100 100 101  CO2 20 - 29 mmol/L _0 Calcium 8.6 - 10.2 mg/dL 9.8 9.1 9.2  Total Protein 6.0 - 8.5 g/dL 7.5 7.0 -  Total Bilirubin 0.0 - 1.2 mg/dL 1.2 1.2 -  Alkaline Phos 39 - 117 IU/L 52 62 -  AST 0 - 40 IU/L 16 12 -  ALT 0 - 44 IU/L 10 13 -   CBC Latest Ref Rng & Units 02/20/2018 01/09/2018 01/08/2018  WBC 4.0 - 10.5 K/uL 5.9 4.9 4.6  Hemoglobin 13.0 - 17.0 g/dL 14.2 12.5(L) 12.6(L)  Hematocrit 39.0 - 52.0 % 43.4 36.4(L) 36.5(L)  Platelets 150 - 400 K/uL 174 134(L) 143(L)   Lipid Panel     Component Value Date/Time   CHOL 203 (H) 03/16/2019 1634   TRIG 81 03/16/2019 1634   HDL 61 03/16/2019 1634   CHOLHDL 3.3 03/16/2019 1634   LDLCALC 126 (H) 03/16/2019 1634   HEMOGLOBIN A1C Lab Results  Component Value Date   HGBA1C 5.6 07/21/2018   TSH Recent Labs    07/21/18 1147 03/16/19 1634  TSH 5.720* 3.650    PRN Meds:. Medications Discontinued During This Encounter  Medication Reason  . cyclobenzaprine (FLEXERIL) 10 MG tablet Error   Current Meds  Medication Sig  . allopurinol (ZYLOPRIM) 300 MG tablet Take 1 tablet (300 mg total) by mouth daily.  . carvedilol (COREG) 25 MG  tablet TAKE 2 TABLETS BY MOUTH EVERY DAY (Patient taking differently: Take 25 mg by mouth. 2 tablets daily)  . cloNIDine (CATAPRES - DOSED IN MG/24 HR) 0.2 mg/24hr patch PLACE 1 PATCH (0.2 MG TOTAL) ONTO THE SKIN ONCE A WEEK.  Marland Kitchen eplerenone (INSPRA) 25 MG tablet Take 1 tablet (25 mg total) by mouth daily.  . furosemide (LASIX) 40 MG tablet TAKE 1 TABLET BY MOUTH EVERY DAY AS NEEDED FOR FLUID (Patient taking differently: Take 40 mg by mouth as needed for fluid. )  . HYDROcodone-acetaminophen (NORCO) 10-325 MG tablet Take 1 tablet by mouth 3 (three) times daily as needed.  Marland Kitchen olmesartan (BENICAR) 40 MG tablet TAKE 1 TABLET BY MOUTH EVERY DAY (Patient taking differently: Take 40 mg by mouth daily. )  . pantoprazole (PROTONIX) 40 MG tablet TAKE 1 TABLET BY MOUTH TWICE A DAY (Patient taking differently: Take 40 mg by mouth daily. )  . rivaroxaban (XARELTO) 20 MG TABS tablet Take 1 tablet (20 mg total) by mouth daily.    Cardiac Studies:   Echocardiogram 12/03/2013: 1. Left ventricle cavity is normal in size.  Severe concentric hypertrophy of the left ventricle.  Normal global wall motion.  Impaired relaxation diastolic filling pattern.  Doppler evidence of grade I (impaired) diastolic dysfunction.  No wall motion abnormalities. EF calculated at 58%.  2. Left atrial cavity is moderately dilated. 3. IVC is dilated with respiratory variation. Suggests elevated right atrial pressure. Compared to the study done on 02/09/2013, EF improved from 40%.  Exercise Myoview stress test 02/13/2013: 1. Resting EKG showed atrial fibrillation, poor R wave progression, non specific ST-depression and T inversion in inferior leads, cannot R/O ischemia. Stress EKG was equivocal for ischemia. There was 40m additional ST depression noted at peak exercise, which resolved at < 2 minutes into recovery. Patient exercised on Bruce protocol for 5 minutes and 2 seconds. The maximum work level achieved was 6.8 MET's. The test was terminated  due to achievement of the target heart rate. 2.  Perfusion imaging study demonstrates prominent diaphragmatic attenuation and mild Uptake artifact in the inferior, inferoapical and apical anterior wall with no statistically significant ischemia demonstrable bipolar plot images.  Dynamic gating images reveal normal wall motion suggesting that this is probably soft tissue attenuation than scar.  Ejection fraction is 53%.  This is a low risk study  Direct current cardioversion 04/21/2013: Successful conversion of atrial fibrillation to normal sinus rhythm with 200 J of electricity.  Assessment:   Paroxysmal atrial fibrillation (HCC) - Plan: EKG 12-Lead  Primary hypertension  Dyspnea on exertion  Morbid obesity ([place])  EKG 06/09/2019: Normal sinus rhythm at 61 bpm, normal axis, PRWP cannot exclude anterior infarct old. No evidence of ischemia.  CHA2DS2-VASc Score is 2 with yearly risk of stroke of 2.2 . Bleeding risk is 0.9.  Recommendations:   Patient was last seen in 2018, lost to follow up. He is overall doing well. He does have dyspnea on exertion that is likely related to inactivity due to right knee pain and obesity. He previously had cardiomyopathy that resolved with blood pressure control.  He has not had echocardiogram since 2015, will obtain this for reevaluation of EF given his symptoms of dyspnea and leg edema.  He has had slight worsening in kidney function, would recommend repeating a BMP in the next few months for continued surveillance. Encouraged him to only use lasix on a as needed basis. His blood pressure is well controlled, continue with his current medications.  He has not had any recurrence of known atrial fibrillation since cardioversion in 2014.  He is on Xarelto and tolerates this well without any bleeding.  We have extensively discussed diet modifications to help with weight loss.  He is currently being followed by [facility] to help him with this.  He states  that they are hoping to start medication to help with his weight loss.  Would not recommend phentermine given his history of A. fib and hypertension. Unless his echocardiogram is abnormal, we will see him back in 6 months for follow up.     Miquel Dunn, MSN, APRN, FNP-C [facility]. [place] Office: [phone] Fax: [phone]

## 2019-06-09 NOTE — Progress Notes (Deleted)
Primary Physician:  Doristine BosworthStallings, Zoe A, MD   Patient ID: Leon Thomas, male    DOB: Dec 10, 1955, 63 y.o.   MRN: [id number]  Subjective:    No chief complaint on file.   HPI: Leon HalonJimmy R Thomas  is a 63 y.o. male  with morbid obesity, history of paroxysmal atrial fibrillation, nonischemic cardiomyopathy with moderate LV systolic dysfunction EF 40-45% in July of 2014, but normalized with hypertension treatment by repeat echo in July 2015, presents here for follow-up. He has maintained sinus rhythm since cardioversion on 04/21/2013.  Patient presently doing well, using CPAP on a regular basis, No chest pain, shortness breath, palpitation. Tolerating anticoagulation without any bleeding diathesis. This is his 3 mont f/u of hypertension. I had discontinued valsartan due to recall and now on Olmesartan. Has noticed occasional constipation since, but otherwise no specific complaints. Did have GERD relieved with OTC PPI. Requests lasix refills that he has been out for 3-4 weeks.  Past Medical History:  Diagnosis Date  . Arthritis    knees  . CHF (congestive heart failure) (HCC)     " A long time ago"  . Dysrhythmia    04-21-13 [facility]Dr. Jacinto HalimGanji. No further problems  . GI bleed   . Gout   . Hypertension   . Rotator cuff tear, right   . Sleep apnea    cpap nightly, settings 3    Past Surgical History:  Procedure Laterality Date  . BIOPSY  01/09/2018   Procedure: BIOPSY;  Surgeon: Kathi DerBrahmbhatt, Parag, MD;  [facility];  Service: Gastroenterology;;  . CARDIOVERSION N/A 04/21/2013   Procedure: CARDIOVERSION;  Surgeon: Pamella PertJagadeesh R Ganji, MD;  [facility];  Service: Cardiovascular;  Laterality: N/A;  h&p in file- HW  . COLONOSCOPY WITH PROPOFOL N/A 09/04/2013   Procedure: COLONOSCOPY WITH PROPOFOL;  Surgeon: Vertell NovakJames L Edwards Jr., MD;  Location: [facility] ENDOSCOPY;  Service: Endoscopy;  Laterality: N/A;  . COLONOSCOPY WITH PROPOFOL N/A 06/01/2019   Procedure:  COLONOSCOPY WITH PROPOFOL;  Surgeon: Carman ChingEdwards, James, MD;  Location: [facility] ENDOSCOPY;  Service: Endoscopy;  Laterality: N/A;  . ESOPHAGOGASTRODUODENOSCOPY (EGD) WITH PROPOFOL N/A 01/09/2018   Procedure: ESOPHAGOGASTRODUODENOSCOPY (EGD) WITH PROPOFOL;  Surgeon: Kathi DerBrahmbhatt, Parag, MD;  [facility]OS[facility];  Service: Gastroenterology;  Laterality: N/A;  . FOOT SURGERY Left   . JOINT REPLACEMENT Left   . ORIF ANKLE FRACTURE Right   . SHOULDER ARTHROSCOPY WITH ROTATOR CUFF REPAIR AND SUBACROMIAL DECOMPRESSION Right 03/20/2018   Procedure: SHOULDER ARTHROSCOPY WITH ROTATOR CUFF REPAIR AND SUBACROMIAL DECOMPRESSION;  Surgeon: Jones Broomhandler, Justin, MD;  Location: [facility] OR;  Service: Orthopedics;  Laterality: Right;    Social History   Socioeconomic History  . Marital status: Single    Spouse name: Not on file  . Number of children: Not on file  . Years of education: Not on file  . Highest education level: 11th grade  Occupational History  . Not on file  Social Needs  . Financial resource strain: Very hard  . Food insecurity    Worry: Often true    Inability: Often true  . Transportation needs    Medical: No    Non-medical: No  Tobacco Use  . Smoking status: Never Smoker  . Smokeless tobacco: Never Used  Substance and Sexual Activity  . Alcohol use: Yes    Alcohol/week: 1.0 standard drinks    Types: 1 Cans of beer per week    Comment: drink occasional beer  . Drug use: No  .  Sexual activity: Not on file  Lifestyle  . Physical activity    Days per week: 0 days    Minutes per session: 0 min  . Stress: Very much  Relationships  . Social connections    Talks on phone: More than three times a week    Gets together: More than three times a week    Attends religious service: Never    Active member of club or organization: No    Attends meetings of clubs or organizations: Never    Relationship status: Never married  . Intimate partner violence    Fear of current or ex partner: No     Emotionally abused: No    Physically abused: No    Forced sexual activity: No  Other Topics Concern  . Not on file  Social History Narrative  . Not on file    Review of Systems  Constitution: Negative for decreased appetite, malaise/fatigue, weight gain and weight loss.  Eyes: Negative for visual disturbance.  Cardiovascular: Negative for chest pain, claudication, dyspnea on exertion, leg swelling, orthopnea, palpitations and syncope.  Respiratory: Negative for hemoptysis and wheezing.   Endocrine: Negative for cold intolerance and heat intolerance.  Hematologic/Lymphatic: Does not bruise/bleed easily.  Skin: Negative for nail changes.  Musculoskeletal: Positive for back pain. Negative for muscle weakness and myalgias.  Gastrointestinal: Positive for constipation. Negative for abdominal pain, change in bowel habit, nausea and vomiting.  Neurological: Negative for difficulty with concentration, dizziness, focal weakness and headaches.  Psychiatric/Behavioral: Negative for altered mental status and suicidal ideas.  All other systems reviewed and are negative.     Objective:  There were no vitals taken for this visit. There is no height or weight on file to calculate BMI.    Physical Exam  Constitutional: He is oriented to person, place, and time. Vital signs are normal. He appears well-developed and well-nourished.  Muscular, well built and morbidly obese  HENT:  Head: Normocephalic and atraumatic.  Neck: Normal range of motion.  Cardiovascular: Normal rate, regular rhythm, normal heart sounds and intact distal pulses.  Pulses:      Femoral pulses are 1+ on the right side and 1+ on the left side.      Popliteal pulses are 1+ on the right side and 1+ on the left side.       Dorsalis pedis pulses are 2+ on the right side and 2+ on the left side.       Posterior tibial pulses are 2+ on the right side and 2+ on the left side.  No edema  Pulmonary/Chest: Effort normal and breath  sounds normal. No accessory muscle usage. No respiratory distress.  Abdominal: Soft. Bowel sounds are normal.  Musculoskeletal: Normal range of motion.  Neurological: He is alert and oriented to person, place, and time.  Skin: Skin is warm and dry.  Vitals reviewed.  Radiology: No results found.  Laboratory examination:   *** CMP Latest Ref Rng & Units 03/16/2019 07/21/2018 02/20/2018  Glucose 65 - 99 mg/dL 95 105(H) 95  BUN 8 - 27 mg/dL 36(H) 16 17  Creatinine 0.76 - 1.27 mg/dL 1.62(H) 1.34(H) 1.21  Sodium 134 - 144 mmol/L 139 139 139  Potassium 3.5 - 5.2 mmol/L 4.8 3.6 4.1  Chloride 96 - 106 mmol/L 100 100 101  CO2 20 - 29 mmol/L 23 24 27   Calcium 8.6 - 10.2 mg/dL 9.8 9.1 9.2  Total Protein 6.0 - 8.5 g/dL 7.5 7.0 -  Total Bilirubin 0.0 -  1.2 mg/dL 1.2 1.2 -  Alkaline Phos 39 - 117 IU/L 52 62 -  AST 0 - 40 IU/L 16 12 -  ALT 0 - 44 IU/L 10 13 -   CBC Latest Ref Rng & Units 02/20/2018 01/09/2018 01/08/2018  WBC 4.0 - 10.5 K/uL 5.9 4.9 4.6  Hemoglobin 13.0 - 17.0 g/dL 01.0 12.5(L) 12.6(L)  Hematocrit 39.0 - 52.0 % 43.4 36.4(L) 36.5(L)  Platelets 150 - 400 K/uL 174 134(L) 143(L)   Lipid Panel     Component Value Date/Time   CHOL 203 (H) 03/16/2019 1634   TRIG 81 03/16/2019 1634   HDL 61 03/16/2019 1634   CHOLHDL 3.3 03/16/2019 1634   LDLCALC 126 (H) 03/16/2019 1634   HEMOGLOBIN A1C Lab Results  Component Value Date   HGBA1C 5.6 07/21/2018   TSH Recent Labs    07/21/18 1147 03/16/19 1634  TSH 5.720* 3.650    PRN Meds:. There are no discontinued medications. No outpatient medications have been marked as taking for the 06/09/19 encounter (Appointment) with Toniann Fail, NP.    Cardiac Studies:   ***  Assessment:   No diagnosis found.  EKG 03/29/2017: Normal sinus rhythm at the rate of 61 bpm, left atrial enlargement, normal axis. R-wave progression, can't exclude anterior infarct old. No evidence of ischemia. Normal QT interval. No significant change  from EKG 02/22/2016, 02/07/2015, 06/21/2014.  CHA2DS2-VASc Score is 2 with yearly risk of stroke of 2.2 . Bleeding risk is 0.9.  Recommendations:   Patient presents here for a three-month follow-up visit of hypertension, on his last office visit I discontinued valsartan due to recall and switched him to Olmesartan, blood pressure is very well controlled.  He request furosemide prescription to be refilled, advised him that he should not take furosemide on a daily basis due to long-term side effects and he also has history of gout. I refilled his prescription, advised him to take it only as necessary for leg edema. Advised him to be compliant with his CPAP, he maintains sinus rhythm, I'll see him back on an annual basis. I have reviewed patient's labs/records and updated them.  Toniann Fail, MSN, APRN, FNP-C [facility]. [place] Office: [phone] Fax: [phone]

## 2019-06-10 ENCOUNTER — Other Ambulatory Visit: Payer: Self-pay | Admitting: Family Medicine

## 2019-06-10 ENCOUNTER — Ambulatory Visit: Payer: Self-pay | Admitting: Cardiology

## 2019-06-10 DIAGNOSIS — K219 Gastro-esophageal reflux disease without esophagitis: Secondary | ICD-10-CM

## 2019-07-04 ENCOUNTER — Other Ambulatory Visit: Payer: Self-pay | Admitting: Family Medicine

## 2019-07-04 DIAGNOSIS — K219 Gastro-esophageal reflux disease without esophagitis: Secondary | ICD-10-CM

## 2019-07-14 ENCOUNTER — Ambulatory Visit (INDEPENDENT_AMBULATORY_CARE_PROVIDER_SITE_OTHER): Payer: Medicare Other

## 2019-07-14 ENCOUNTER — Other Ambulatory Visit: Payer: Self-pay

## 2019-07-14 ENCOUNTER — Encounter: Payer: Self-pay | Admitting: Family Medicine

## 2019-07-14 DIAGNOSIS — R0609 Other forms of dyspnea: Secondary | ICD-10-CM

## 2019-07-16 ENCOUNTER — Other Ambulatory Visit: Payer: Self-pay

## 2019-07-16 ENCOUNTER — Encounter: Payer: Medicare Other | Attending: Physician Assistant | Admitting: Skilled Nursing Facility1

## 2019-07-16 ENCOUNTER — Encounter: Payer: Self-pay | Admitting: Skilled Nursing Facility1

## 2019-07-16 DIAGNOSIS — Z7901 Long term (current) use of anticoagulants: Secondary | ICD-10-CM | POA: Insufficient documentation

## 2019-07-16 DIAGNOSIS — Z713 Dietary counseling and surveillance: Secondary | ICD-10-CM | POA: Diagnosis present

## 2019-07-16 DIAGNOSIS — R296 Repeated falls: Secondary | ICD-10-CM | POA: Insufficient documentation

## 2019-07-16 DIAGNOSIS — G894 Chronic pain syndrome: Secondary | ICD-10-CM | POA: Insufficient documentation

## 2019-07-16 DIAGNOSIS — M109 Gout, unspecified: Secondary | ICD-10-CM | POA: Insufficient documentation

## 2019-07-16 DIAGNOSIS — Z6841 Body Mass Index (BMI) 40.0 and over, adult: Secondary | ICD-10-CM | POA: Diagnosis not present

## 2019-07-16 DIAGNOSIS — N1832 Chronic kidney disease, stage 3b: Secondary | ICD-10-CM | POA: Diagnosis not present

## 2019-07-16 DIAGNOSIS — Z79891 Long term (current) use of opiate analgesic: Secondary | ICD-10-CM | POA: Insufficient documentation

## 2019-07-16 DIAGNOSIS — E669 Obesity, unspecified: Secondary | ICD-10-CM | POA: Diagnosis not present

## 2019-07-16 DIAGNOSIS — I129 Hypertensive chronic kidney disease with stage 1 through stage 4 chronic kidney disease, or unspecified chronic kidney disease: Secondary | ICD-10-CM | POA: Diagnosis not present

## 2019-07-16 DIAGNOSIS — Z79899 Other long term (current) drug therapy: Secondary | ICD-10-CM | POA: Insufficient documentation

## 2019-07-16 DIAGNOSIS — Z8249 Family history of ischemic heart disease and other diseases of the circulatory system: Secondary | ICD-10-CM | POA: Insufficient documentation

## 2019-07-16 NOTE — Progress Notes (Signed)
  Assessment:  Primary concerns today: obesity.   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  Pt states he has to lose weight in order to qualify for knee surgery. Pt states if his knee didn't hurt so bad he could get in the gym and lose the weight. Pt states his knee bothers him the most when he tries to sleep.  Pt states he wears his C-PAP every night.  Pt states his usual weight was 240 until 1999 until his doctor put him on steroids causing him to gain weight.  Pt states he drinks alcohol to deal with stress.  Pt has healthy cooking habits and never fries his food.  Pt states he loves to get vegetables from his sisters garden.  MEDICATIONS: see list   DIETARY INTAKE:  Usual eating pattern includes 1 meals and multiple snacks per day.  Everyday foods include none stated.  Avoided foods include none stated.    24-hr recall:  B ( AM): skipped V8 vegetable juice  Snk ( AM):  L ( PM): skipped Snk ( PM):  D ( PM): hamburger patty in the crock pot with onions  Snk ( PM): low fat ice cream  Beverages: 4 beers and some shots, water with vinegar  Usual physical activity: ADL's due to pain  Intervention:  Nutrition counseling. Dietitian educated pt on balanced meals resulting in weight loss and kidney stage 3 disease.   Goals: Eat breakfast and lunch every day Breakfast ideas: 1 piece of toast and cottage cheese or peanut butter on toast with sugar free jelly or granola cereal  Lunch ideas: [place] sandwich with cucumber or salad: onion, croutons, [place], cucumbers or soup Limit to 2-3 servings of fruit total per day Limit your nuts to 1/4 cup servings limiting to 1-2 times a day Limit your alcohol (beer, spirit, wine) to 2 drinks er day; this does not change for the weekend  Teaching Method Utilized:  Visual Auditory Hands on  Handouts given during visit include:  My plate  Barriers to learning/adherence to lifestyle change: none identified    Demonstrated degree of understanding via:  Teach Back   Monitoring/Evaluation:  Dietary intake, exercise, and body weight prn.

## 2019-07-27 ENCOUNTER — Other Ambulatory Visit: Payer: Self-pay | Admitting: Family Medicine

## 2019-07-27 DIAGNOSIS — K219 Gastro-esophageal reflux disease without esophagitis: Secondary | ICD-10-CM

## 2019-07-28 NOTE — Progress Notes (Signed)
Patient aware os normal echo results.

## 2019-07-29 ENCOUNTER — Other Ambulatory Visit: Payer: Self-pay | Admitting: Family Medicine

## 2019-07-29 NOTE — Telephone Encounter (Signed)
Patient is requesting a refill of the following medications: Requested Prescriptions   Pending Prescriptions Disp Refills   eplerenone (INSPRA) 25 MG tablet [Pharmacy Med Name: EPLERENONE 25 MG TABLET] 90 tablet 1    Sig: TAKE 1 TABLET BY MOUTH EVERY DAY    Date of patient request:07/29/19 Last office visit: 03/16/19 Date of last refill: 07/21/2018 Last refill amount: 90-1rf Follow up time period per chart: 08/02/2018

## 2019-08-03 ENCOUNTER — Ambulatory Visit (INDEPENDENT_AMBULATORY_CARE_PROVIDER_SITE_OTHER): Payer: Medicare Other

## 2019-08-03 ENCOUNTER — Other Ambulatory Visit: Payer: Self-pay | Admitting: Family Medicine

## 2019-08-03 ENCOUNTER — Encounter: Payer: Self-pay | Admitting: Orthopedic Surgery

## 2019-08-03 ENCOUNTER — Ambulatory Visit (INDEPENDENT_AMBULATORY_CARE_PROVIDER_SITE_OTHER): Payer: Medicare Other | Admitting: Orthopedic Surgery

## 2019-08-03 ENCOUNTER — Other Ambulatory Visit: Payer: Self-pay

## 2019-08-03 VITALS — Ht 74.0 in | Wt 371.0 lb

## 2019-08-03 DIAGNOSIS — M1711 Unilateral primary osteoarthritis, right knee: Secondary | ICD-10-CM

## 2019-08-03 DIAGNOSIS — N182 Chronic kidney disease, stage 2 (mild): Secondary | ICD-10-CM

## 2019-08-03 NOTE — Progress Notes (Signed)
Office Visit Note   Patient: Leon Thomas           Date of Birth: 1956/07/27           MRN: [id number] Visit Date: 08/03/2019              Requested by: Forrest Moron, MD [place],  [place] [address] PCP: Forrest Moron, MD  Chief Complaint  Patient presents with  . Right Knee - Pain      HPI: This is a pleasant gentleman with a chief complaint of right knee pain he is pain in his right knee that is not improved and has been told he has arthritis he is status post left knee replacement he has had cortisone injections into the right knee but they only lasted a couple weeks.  Assessment & Plan: Visit Diagnoses:  1. Unilateral primary osteoarthritis, right knee     Plan: We will go forward with insurance approval for Ortho 1 or Synvisc.  In the meantime he will work on weight reduction.  Ultimately he may need a knee replacement  Follow-Up Instructions: No follow-ups on file.   Ortho Exam  Patient is alert, oriented, no adenopathy, well-dressed, normal affect, normal respiratory effort. Right knee crepitus with range of motion no effusion no cellulitis  Imaging: No results found. No images are attached to the encounter.  Labs: Lab Results  Component Value Date   HGBA1C 5.6 07/21/2018   HGBA1C 5.2 05/13/2017     Lab Results  Component Value Date   ALBUMIN 4.4 03/16/2019   ALBUMIN 3.9 07/21/2018   ALBUMIN 4.0 01/07/2018    No results found for: MG No results found for: VD25OH  No results found for: PREALBUMIN CBC EXTENDED Latest Ref Rng & Units 02/20/2018 01/09/2018 01/08/2018  WBC 4.0 - 10.5 K/uL 5.9 4.9 4.6  RBC 4.22 - 5.81 MIL/uL 4.87 4.03(L) 4.01(L)  HGB 13.0 - 17.0 g/dL 14.2 12.5(L) 12.6(L)  HCT 39.0 - 52.0 % 43.4 36.4(L) 36.5(L)  PLT 150 - 400 K/uL 174 134(L) 143(L)  NEUTROABS 1.7 - 7.7 K/uL - - -  LYMPHSABS 0.7 - 4.0 K/uL - - -     Body mass index is 47.63 kg/m.  Orders:  Orders Placed This Encounter  Procedures  . XR Knee 1-2  Views Right   No orders of the defined types were placed in this encounter.    Procedures: No procedures performed  Clinical Data: No additional findings.  ROS:  All other systems negative, except as noted in the HPI. Review of Systems  Objective: Vital Signs: Ht 6\' 2"  (1.88 m)   Wt (!) 371 lb (168.3 kg)   BMI 47.63 kg/m   Specialty Comments:  No specialty comments available.  PMFS History: Patient Active Problem List   Diagnosis Date Noted  . Chronic gastritis 01/28/2018  . GI bleed 01/07/2018  . Primary osteoarthritis of right knee 12/12/2017  . Chronic prescription opiate use 12/12/2017  . Chronic pain syndrome 12/12/2017  . Atrial fibrillation ([place]) 05/13/2017  . Gout, arthritis 05/13/2017  . Osteoarthritis of left knee 05/13/2017  . Morbid obesity ([place]) 05/13/2017  . Chronic anticoagulation 05/13/2017  . History of knee joint replacement 05/13/2017  . Chronic use of opiate drugs therapeutic purposes 05/13/2017  . Anosmia 06/20/2016  . OSA (obstructive sleep apnea) 06/20/2016   Past Medical History:  Diagnosis Date  . Arthritis    knees  . CHF (congestive heart failure) ([place])     "  A long time ago"  . Dysrhythmia    04-21-13 Cardioverted at [facility] for A. Fib-Dr. Jacinto Halim. No further problems  . GI bleed   . Gout   . Hypertension   . Rotator cuff tear, right   . Sleep apnea    cpap nightly, settings 3    Family History  Problem Relation Age of Onset  . Diabetes Mother   . Alzheimer's disease Mother   . Diabetes Father   . Diabetes Brother   . Stroke Brother   . Stroke Sister     Past Surgical History:  Procedure Laterality Date  . BIOPSY  01/09/2018   Procedure: BIOPSY;  Surgeon: Kathi Der, MD;  Location: [facility];  Service: Gastroenterology;;  . CARDIOVERSION N/A 04/21/2013   Procedure: CARDIOVERSION;  Surgeon: [name], MD;  Location: [facility] ENDOSCOPY;  Service: Cardiovascular;  Laterality: N/A;  h&p in file- HW  .  COLONOSCOPY WITH PROPOFOL N/A 09/04/2013   Procedure: COLONOSCOPY WITH PROPOFOL;  Surgeon: Vertell Novak., MD;  Location: [facility] ENDOSCOPY;  Service: Endoscopy;  Laterality: N/A;  . COLONOSCOPY WITH PROPOFOL N/A 06/01/2019   Procedure: COLONOSCOPY WITH PROPOFOL;  Surgeon: Carman Ching, MD;  Location: [facility] ENDOSCOPY;  Service: Endoscopy;  Laterality: N/A;  . ESOPHAGOGASTRODUODENOSCOPY (EGD) WITH PROPOFOL N/A 01/09/2018   Procedure: ESOPHAGOGASTRODUODENOSCOPY (EGD) WITH PROPOFOL;  Surgeon: Kathi Der, MD;  Location: [facility] ENDOSCOPY;  Service: Gastroenterology;  Laterality: N/A;  . FOOT SURGERY Left   . JOINT REPLACEMENT Left   . ORIF ANKLE FRACTURE Right   . SHOULDER ARTHROSCOPY WITH ROTATOR CUFF REPAIR AND SUBACROMIAL DECOMPRESSION Right 03/20/2018   Procedure: SHOULDER ARTHROSCOPY WITH ROTATOR CUFF REPAIR AND SUBACROMIAL DECOMPRESSION;  Surgeon: [name], MD;  Location: [facility] OR;  Service: Orthopedics;  Laterality: Right;   Social History   Occupational History  . Not on file  Tobacco Use  . Smoking status: Never Smoker  . Smokeless tobacco: Never Used  Substance and Sexual Activity  . Alcohol use: Yes    Alcohol/week: 1.0 standard drinks    Types: 1 Cans of beer per week    Comment: drink occasional beer  . Drug use: No  . Sexual activity: Not on file

## 2019-08-11 ENCOUNTER — Telehealth: Payer: Self-pay

## 2019-08-11 NOTE — Telephone Encounter (Signed)
Submitted VOB for SynviscOne, right knee. 

## 2019-08-20 ENCOUNTER — Other Ambulatory Visit: Payer: Self-pay | Admitting: Family Medicine

## 2019-08-20 DIAGNOSIS — K219 Gastro-esophageal reflux disease without esophagitis: Secondary | ICD-10-CM

## 2019-08-23 ENCOUNTER — Other Ambulatory Visit: Payer: Self-pay | Admitting: Family Medicine

## 2019-08-27 ENCOUNTER — Other Ambulatory Visit: Payer: Self-pay | Admitting: Family Medicine

## 2019-08-27 DIAGNOSIS — M1A3720 Chronic gout due to renal impairment, left ankle and foot, without tophus (tophi): Secondary | ICD-10-CM

## 2019-08-27 NOTE — Telephone Encounter (Signed)
Forwarding medication refill request to the clinical pool for review. 

## 2019-09-04 ENCOUNTER — Telehealth: Payer: Self-pay

## 2019-09-04 NOTE — Telephone Encounter (Signed)
Approved for SynviscOne, right knee. Buy & Bill Patient will be responsible for 20% OOP. No Co-pay No PA required  Appt. 09/07/2019 with Dr. Lajoyce Corners

## 2019-09-07 ENCOUNTER — Encounter: Payer: Self-pay | Admitting: Orthopedic Surgery

## 2019-09-07 ENCOUNTER — Other Ambulatory Visit: Payer: Self-pay

## 2019-09-07 ENCOUNTER — Ambulatory Visit (INDEPENDENT_AMBULATORY_CARE_PROVIDER_SITE_OTHER): Payer: Medicare Other | Admitting: Orthopedic Surgery

## 2019-09-07 DIAGNOSIS — M1711 Unilateral primary osteoarthritis, right knee: Secondary | ICD-10-CM | POA: Diagnosis not present

## 2019-09-07 MED ORDER — LIDOCAINE HCL 1 % IJ SOLN
1.0000 mL | INTRAMUSCULAR | Status: AC | PRN
  Administered 2019-09-07: 1 mL

## 2019-09-07 MED ORDER — HYLAN G-F 20 48 MG/6ML IX SOSY
48.0000 mg | PREFILLED_SYRINGE | INTRA_ARTICULAR | Status: AC | PRN
  Administered 2019-09-07: 48 mg via INTRA_ARTICULAR

## 2019-09-07 NOTE — Progress Notes (Signed)
Office Visit Note   Patient: Leon Thomas           Date of Birth: 1956/04/21           MRN: [id number] Visit Date: 09/07/2019              Requested by: Doristine Bosworth, MD [address] [place],  [place] [address] PCP: Doristine Bosworth, MD  Chief Complaint  Patient presents with  . Right Knee - Pain      HPI: This is a pleasant gentleman who presents today for his right Synvisc 1 injection.  He has a history of right knee arthritis.  He has a knee replacement on the left that was done approximately 7 or 8 years ago.  He does said this occasionally bothers him as well.  Assessment & Plan: Visit Diagnoses: No diagnosis found.  Plan: He will follow up in 1 month.  If he is still having pain in the left knee we should get x-rays of that at that time  Follow-Up Instructions: No follow-ups on file.   Ortho Exam  Patient is alert, oriented, no adenopathy, well-dressed, normal affect, normal respiratory effort. Focused examination demonstrates right knee no effusion no cellulitis tender over joint line  Imaging: No results found. No images are attached to the encounter.  Labs: Lab Results  Component Value Date   HGBA1C 5.6 07/21/2018   HGBA1C 5.2 05/13/2017     Lab Results  Component Value Date   ALBUMIN 4.4 03/16/2019   ALBUMIN 3.9 07/21/2018   ALBUMIN 4.0 01/07/2018    No results found for: MG No results found for: VD25OH  No results found for: PREALBUMIN CBC EXTENDED Latest Ref Rng & Units 02/20/2018 01/09/2018 01/08/2018  WBC 4.0 - 10.5 K/uL 5.9 4.9 4.6  RBC 4.22 - 5.81 MIL/uL 4.87 4.03(L) 4.01(L)  HGB 13.0 - 17.0 g/dL 05.3 12.5(L) 12.6(L)  HCT 39.0 - 52.0 % 43.4 36.4(L) 36.5(L)  PLT 150 - 400 K/uL 174 134(L) 143(L)  NEUTROABS 1.7 - 7.7 K/uL - - -  LYMPHSABS 0.7 - 4.0 K/uL - - -     There is no height or weight on file to calculate BMI.  Orders:  No orders of the defined types were placed in this encounter.  No orders of the defined types were placed  in this encounter.    Procedures: Large Joint Inj: R knee on 09/07/2019 10:30 AM Indications: pain and diagnostic evaluation Details: 18 G 1.5 in needle, anterolateral approach  Arthrogram: No  Medications: 1 mL lidocaine 1 %; 48 mg Hylan 48 MG/6ML Outcome: tolerated well, no immediate complications Procedure, treatment alternatives, risks and benefits explained, specific risks discussed. Consent was given by the patient. Immediately prior to procedure a time out was called to verify the correct patient, procedure, equipment, support staff and site/side marked as required. Patient was prepped and draped in the usual sterile fashion.      Clinical Data: No additional findings.  ROS:  All other systems negative, except as noted in the HPI. Review of Systems  Objective: Vital Signs: There were no vitals taken for this visit.  Specialty Comments:  No specialty comments available.  PMFS History: Patient Active Problem List   Diagnosis Date Noted  . Chronic gastritis 01/28/2018  . GI bleed 01/07/2018  . Primary osteoarthritis of right knee 12/12/2017  . Chronic prescription opiate use 12/12/2017  . Chronic pain syndrome 12/12/2017  . Atrial fibrillation (H[facility]) 05/13/2017  . Gout, arthritis 05/13/2017  .  Osteoarthritis of left knee 05/13/2017  . Morbid obesity ([place]) 05/13/2017  . Chronic anticoagulation 05/13/2017  . History of knee joint replacement 05/13/2017  . Chronic use of opiate drugs therapeutic purposes 05/13/2017  . Anosmia 06/20/2016  . OSA (obstructive sleep apnea) 06/20/2016   Past Medical History:  Diagnosis Date  . Arthritis    knees  . CHF (congestive heart failure) ([place])     " A long time ago"  . Dysrhythmia    04-21-13 Cardioverted at [facility] for A. Fib-Dr. Einar Gip. No further problems  . GI bleed   . Gout   . Hypertension   . Rotator cuff tear, right   . Sleep apnea    cpap nightly, settings 3    Family History  Problem Relation Age of  Onset  . Diabetes Mother   . Alzheimer's disease Mother   . Diabetes Father   . Diabetes Brother   . Stroke Brother   . Stroke Sister     Past Surgical History:  Procedure Laterality Date  . BIOPSY  01/09/2018   Procedure: BIOPSY;  Surgeon: Otis Brace, MD;  Location: [facility];  Service: Gastroenterology;;  . CARDIOVERSION N/A 04/21/2013   Procedure: CARDIOVERSION;  Surgeon: Laverda Page, MD;  Location: [facility] ENDOSCOPY;  Service: Cardiovascular;  Laterality: N/A;  h&p in file- HW  . COLONOSCOPY WITH PROPOFOL N/A 09/04/2013   Procedure: COLONOSCOPY WITH PROPOFOL;  Surgeon: Winfield Cunas., MD;  Location: [facility] ENDOSCOPY;  Service: Endoscopy;  Laterality: N/A;  . COLONOSCOPY WITH PROPOFOL N/A 06/01/2019   Procedure: COLONOSCOPY WITH PROPOFOL;  Surgeon: Laurence Spates, MD;  Location[facility] ENDOS[facility];  Service: Endoscopy;  Laterality: N/A;  . ESOPHAGOGASTRODUODENOSCOPY (EGD) WITH PROPOFOL N/A 01/09/2018   Procedure: ESOPHAGOGASTRODUODENOSCOPY (EGD) WITH PROPOFOL;  Surgeon: Otis Brace, MD;  [facility] END[facility];  Service: Gastroenterology;  Laterality: N/A;  . FOOT SURGERY Left   . JOINT REPLACEMENT Left   . ORIF ANKLE FRACTURE Right   . SHOULDER ARTHROSCOPY WITH ROTATOR CUFF REPAIR AND SUBACROMIAL DECOMPRESSION Right 03/20/2018   Procedure: SHOULDER ARTHROSCOPY WITH ROTATOR CUFF REPAIR AND SUBACROMIAL DECOMPRESSION;  Surgeon: Tania Ade, MD;  Location: [facility];  Service: Orthopedics;  Laterality: Right;   Social History   Occupational History  . Not on file  Tobacco Use  . Smoking status: Never Smoker  . Smokeless tobacco: Never Used  Substance and Sexual Activity  . Alcohol use: Yes    Alcohol/week: 1.0 standard drinks    Types: 1 Cans of beer per week    Comment: drink occasional beer  . Drug use: No  . Sexual activity: Not on file

## 2019-09-25 ENCOUNTER — Other Ambulatory Visit: Payer: Self-pay | Admitting: Family Medicine

## 2019-09-25 DIAGNOSIS — K219 Gastro-esophageal reflux disease without esophagitis: Secondary | ICD-10-CM

## 2019-09-25 NOTE — Telephone Encounter (Signed)
Requested Prescriptions  Pending Prescriptions Disp Refills  . pantoprazole (PROTONIX) 40 MG tablet [Pharmacy Med Name: PANTOPRAZOLE SOD DR 40 MG TAB] 60 tablet 0    Sig: TAKE 1 TABLET BY MOUTH TWICE A DAY     Gastroenterology: Proton Pump Inhibitors Passed - 09/25/2019  8:30 AM      Passed - Valid encounter within last 12 months    Recent Outpatient Visits          6 months ago Essential hypertension   Primary Care at [facility], [place], [place]   1 year ago Essential hypertension   Primary Care at [facility], [place], [place]   1 year ago Disorder of right rotator cuff   Primary Care at [facility], [place], MD   1 year ago Chronic pain of right knee   Primary Care at [facility], [place] Rudd, MD   2 years ago Encounter for medication monitoring   [facility] at [facility], [place] Rudd, MD      Future Appointments            In 3 days Persons, [place], [place] [facility]

## 2019-09-28 ENCOUNTER — Ambulatory Visit (INDEPENDENT_AMBULATORY_CARE_PROVIDER_SITE_OTHER): Payer: Medicare Other | Admitting: Orthopedic Surgery

## 2019-09-28 ENCOUNTER — Encounter: Payer: Self-pay | Admitting: Physician Assistant

## 2019-09-28 ENCOUNTER — Other Ambulatory Visit: Payer: Self-pay

## 2019-09-28 VITALS — Ht 74.0 in | Wt 371.0 lb

## 2019-09-28 DIAGNOSIS — M1711 Unilateral primary osteoarthritis, right knee: Secondary | ICD-10-CM | POA: Diagnosis not present

## 2019-09-28 NOTE — Progress Notes (Signed)
Office Visit Note   Patient: Leon Thomas           Date of Birth: Jan 08, 1956           MRN: [id number] Visit Date: 09/28/2019              Requested by: Doristine Bosworth, MD [address] [place],  [place] [address] PCP: Doristine Bosworth, MD  Chief Complaint  Patient presents with  . Right Knee - Follow-up      HPI: Patient is a 64 year old gentleman who presents with end-stage osteoarthritis right knee he has had an injection several weeks ago without any improvement.  He is currently ambulating with a cane.  Patient states that he has had his knee give out on him he fell and sustained an injury to his left total knee replacement.  Assessment & Plan: Visit Diagnoses:  1. Unilateral primary osteoarthritis, right knee     Plan: Patient states he would like to proceed with total knee arthroplasty on the right risk and benefits were discussed including infection neurovascular injury persistent pain need for additional surgery.  Patient states he understands and wishes to proceed at this time.  Follow-Up Instructions: Return in about 2 weeks (around 10/12/2019).   Ortho Exam  Patient is alert, oriented, no adenopathy, well-dressed, normal affect, normal respiratory effort. Examination patient has an antalgic gait he uses a cane he has varus alignment to the right knee with ambulation range of motion is 10 to 90 degrees of the right knee.  Radiographs of the right knee shows tricompartmental arthritis with varus alignment bone-on-bone contact medial joint line with tricompartmental spurs.  He has crepitation with range of motion of the knee collaterals and cruciates are stable.  Imaging: No results found. No images are attached to the encounter.  Labs: Lab Results  Component Value Date   HGBA1C 5.6 07/21/2018   HGBA1C 5.2 05/13/2017     Lab Results  Component Value Date   ALBUMIN 4.4 03/16/2019   ALBUMIN 3.9 07/21/2018   ALBUMIN 4.0 01/07/2018    No results found for:  MG No results found for: VD25OH  No results found for: PREALBUMIN CBC EXTENDED Latest Ref Rng & Units 02/20/2018 01/09/2018 01/08/2018  WBC 4.0 - 10.5 K/uL 5.9 4.9 4.6  RBC 4.22 - 5.81 MIL/uL 4.87 4.03(L) 4.01(L)  HGB 13.0 - 17.0 g/dL 78.5 12.5(L) 12.6(L)  HCT 39.0 - 52.0 % 43.4 36.4(L) 36.5(L)  PLT 150 - 400 K/uL 174 134(L) 143(L)  NEUTROABS 1.7 - 7.7 K/uL - - -  LYMPHSABS 0.7 - 4.0 K/uL - - -     Body mass index is 47.63 kg/m.  Orders:  No orders of the defined types were placed in this encounter.  No orders of the defined types were placed in this encounter.    Procedures: No procedures performed  Clinical Data: No additional findings.  ROS:  All other systems negative, except as noted in the HPI. Review of Systems  Objective: Vital Signs: Ht 6\' 2"  (1.88 m)   Wt (!) 371 lb (168.3 kg)   BMI 47.63 kg/m   Specialty Comments:  No specialty comments available.  PMFS History: Patient Active Problem List   Diagnosis Date Noted  . Chronic gastritis 01/28/2018  . GI bleed 01/07/2018  . Primary osteoarthritis of right knee 12/12/2017  . Chronic prescription opiate use 12/12/2017  . Chronic pain syndrome 12/12/2017  . Atrial fibrillation (HCC) 05/13/2017  . Gout, arthritis 05/13/2017  . Osteoarthritis  of left knee 05/13/2017  . Morbid obesity ([place]) 05/13/2017  . Chronic anticoagulation 05/13/2017  . History of knee joint replacement 05/13/2017  . Chronic use of opiate drugs therapeutic purposes 05/13/2017  . Anosmia 06/20/2016  . OSA (obstructive sleep apnea) 06/20/2016   Past Medical History:  Diagnosis Date  . Arthritis    knees  . CHF (congestive heart failure) ([place])     " A long time ago"  . Dysrhythmia    04-21-13 Cardioverted at [facility] for A. Fib-Dr. Einar Gip. No further problems  . GI bleed   . Gout   . Hypertension   . Rotator cuff tear, right   . Sleep apnea    cpap nightly, settings 3    Family History  Problem Relation Age of Onset  .  Diabetes Mother   . Alzheimer's disease Mother   . Diabetes Father   . Diabetes Brother   . Stroke Brother   . Stroke Sister     Past Surgical History:  Procedure Laterality Date  . BIOPSY  01/09/2018   Procedure: BIOPSY;  Surgeon: Otis Brace, MD;  Location: [facility];  Service: Gastroenterology;;  . CARDIOVERSION N/A 04/21/2013   Procedure: CARDIOVERSION;  Surgeon: Laverda Page, MD;  Location: [facility] ENDOSCOPY;  Service: Cardiovascular;  Laterality: N/A;  h&p in file- HW  . COLONOSCOPY WITH PROPOFOL N/A 09/04/2013   Procedure: COLONOSCOPY WITH PROPOFOL;  Surgeon: Winfield Cunas., MD;  Location: [facility] ENDOSCOPY;  Service: Endoscopy;  Laterality: N/A;  . COLONOSCOPY WITH PROPOFOL N/A 06/01/2019   Procedure: COLONOSCOPY WITH PROPOFOL;  Surgeon: Laurence Spates, MD;  Location[facility] ENDOSCOPY;  Service: Endoscopy;  Laterality: N/A;  . ESOPHAGOGASTRODUODENOSCOPY (EGD) WITH PROPOFOL N/A 01/09/2018   Procedure: ESOPHAGOGASTRODUODENOSCOPY (EGD) WITH PROPOFOL;  Surgeon: Otis Brace, MD;  Location: [facility] ENDOSCOPY;  Service: Gastroenterology;  Laterality: N/A;  . FOOT SURGERY Left   . JOINT REPLACEMENT Left   . ORIF ANKLE FRACTURE Right   . SHOULDER ARTHROSCOPY WITH ROTATOR CUFF REPAIR AND SUBACROMIAL DECOMPRESSION Right 03/20/2018   Procedure: SHOULDER ARTHROSCOPY WITH ROTATOR CUFF REPAIR AND SUBACROMIAL DECOMPRESSION;  Surgeon: Tania Ade, MD;  Location: [facility];  Service: Orthopedics;  Laterality: Right;   Social History   Occupational History  . Not on file  Tobacco Use  . Smoking status: Never Smoker  . Smokeless tobacco: Never Used  Substance and Sexual Activity  . Alcohol use: Yes    Alcohol/week: 1.0 standard drinks    Types: 1 Cans of beer per week    Comment: drink occasional beer  . Drug use: No  . Sexual activity: Not on file

## 2019-10-22 ENCOUNTER — Other Ambulatory Visit: Payer: Self-pay | Admitting: Family Medicine

## 2019-10-22 DIAGNOSIS — K219 Gastro-esophageal reflux disease without esophagitis: Secondary | ICD-10-CM

## 2019-11-09 ENCOUNTER — Encounter: Payer: Self-pay | Admitting: Cardiology

## 2019-11-17 ENCOUNTER — Other Ambulatory Visit: Payer: Self-pay | Admitting: Family Medicine

## 2019-11-17 DIAGNOSIS — K219 Gastro-esophageal reflux disease without esophagitis: Secondary | ICD-10-CM

## 2019-12-07 ENCOUNTER — Other Ambulatory Visit: Payer: Self-pay

## 2019-12-10 ENCOUNTER — Other Ambulatory Visit: Payer: Self-pay | Admitting: Family Medicine

## 2019-12-10 DIAGNOSIS — K219 Gastro-esophageal reflux disease without esophagitis: Secondary | ICD-10-CM

## 2019-12-15 ENCOUNTER — Other Ambulatory Visit: Payer: Self-pay | Admitting: Physician Assistant

## 2019-12-15 NOTE — Progress Notes (Signed)
[id number] [place], [place] - [address] [place] [address] Phone: [phone] Fax: [phone]      Your procedure is scheduled on Wednesday, Dec 23, 2019.  Report to [facility] Main Entrance "A" at 5:30 A.M., and check in at the Admitting office.  Call this number if you have problems the morning of surgery:  [phone]  Call [phone] if you have any questions prior to your surgery date Monday-Friday 8am-4pm    Remember:  Do not eat after midnight the night before your surgery  You may drink clear liquids until 4:30 AM the morning of your surgery.   Clear liquids allowed are: Water, Non-Citrus Juices (without pulp), Carbonated Beverages, Clear Tea, Black Coffee Only, and Gatorade   Enhanced Recovery after Surgery for Orthopedics Enhanced Recovery after Surgery is a protocol used to improve the stress on your body and your recovery after surgery.  Patient Instructions  . The night before surgery:  o No food after midnight. ONLY clear liquids after midnight  .  Marland Kitchen The day of surgery (if you do NOT have diabetes):  o Drink ONE (1) Pre-Surgery Clear Ensure as directed.   o This drink was given to you during your hospital  pre-op appointment visit. o The pre-op nurse will instruct you on the time to drink the  Pre-Surgery Ensure depending on your surgery time. o Finish the drink by 4:30 AM. DO NOT SIP. o Nothing else to drink after completing the  Pre-Surgery Clear Ensure.     Take these medicines the morning of surgery with A SIP OF WATER:  allopurinol (ZYLOPRIM) carvedilol (COREG)  pantoprazole (PROTONIX) topiramate (TOPAMAX) HYDROcodone-acetaminophen (NORCO) - if needed  Follow your surgeon's instructions on when to stop Xarelto.  If no instructions were given by your surgeon then you will need to call the office to get those instructions.    As of today, STOP taking any Aspirin (unless otherwise instructed by  your surgeon) and Aspirin containing products, Aleve, Naproxen, Ibuprofen, Motrin, Advil, Goody's, BC's, all herbal medications, fish oil, and all vitamins.                      Do not wear jewelry.            Do not wear lotions, powders, colognes, or deodorant.            Men may shave face and neck.            Do not bring valuables to the hospital.            [facility] is not responsible for any belongings or valuables.  Do NOT Smoke (Tobacco/Vapping) or drink Alcohol 24 hours prior to your procedure If you use a CPAP at night, you may bring all equipment for your overnight stay.   Contacts, glasses, dentures or bridgework may not be worn into surgery.      For patients admitted to the hospital, discharge time will be determined by your treatment team.   Patients discharged the day of surgery will not be allowed to drive home, and someone needs to stay with them for 24 hours.    Special instructions:   [place]- Preparing For Surgery  Before surgery, you can play an important role. Because skin is not sterile, your skin needs to be as free of germs as possible. You can reduce the number of germs on your skin by washing with CHG (chlorahexidine  gluconate) Soap before surgery.  CHG is an antiseptic cleaner which kills germs and bonds with the skin to continue killing germs even after washing.    Oral Hygiene is also important to reduce your risk of infection.  Remember - BRUSH YOUR TEETH THE MORNING OF SURGERY WITH YOUR REGULAR TOOTHPASTE  Please do not use if you have an allergy to CHG or antibacterial soaps. If your skin becomes reddened/irritated stop using the CHG.  Do not shave (including legs and underarms) for at least 48 hours prior to first CHG shower. It is OK to shave your face.  Please follow these instructions carefully.   1. Shower the NIGHT BEFORE SURGERY and the MORNING OF SURGERY with CHG Soap.   2. If you chose to wash your hair, wash your hair first as  usual with your normal shampoo.  3. After you shampoo, rinse your hair and body thoroughly to remove the shampoo.  4. Use CHG as you would any other liquid soap. You can apply CHG directly to the skin and wash gently with a scrungie or a clean washcloth.   5. Apply the CHG Soap to your body ONLY FROM THE NECK DOWN.  Do not use on open wounds or open sores. Avoid contact with your eyes, ears, mouth and genitals (private parts). Wash Face and genitals (private parts)  with your normal soap.   6. Wash thoroughly, paying special attention to the area where your surgery will be performed.  7. Thoroughly rinse your body with warm water from the neck down.  8. DO NOT shower/wash with your normal soap after using and rinsing off the CHG Soap.  9. Pat yourself dry with a CLEAN TOWEL.  10. Wear CLEAN PAJAMAS to bed the night before surgery, wear comfortable clothes the morning of surgery  11. Place CLEAN SHEETS on your bed the night of your first shower and DO NOT SLEEP WITH PETS.   Day of Surgery:   Do not apply any deodorants/lotions.  Please wear clean clothes to the hospital/surgery center.   Remember to brush your teeth WITH YOUR REGULAR TOOTHPASTE.   Please read over the following fact sheets that you were given.

## 2019-12-16 ENCOUNTER — Other Ambulatory Visit: Payer: Self-pay

## 2019-12-16 ENCOUNTER — Encounter (HOSPITAL_COMMUNITY): Payer: Self-pay

## 2019-12-16 ENCOUNTER — Encounter (HOSPITAL_COMMUNITY)
Admission: RE | Admit: 2019-12-16 | Discharge: 2019-12-16 | Disposition: A | Discharge status: Home / Self Care | Payer: Medicare Other | Source: Ambulatory Visit | Attending: Orthopedic Surgery | Admitting: Orthopedic Surgery

## 2019-12-16 DIAGNOSIS — I110 Hypertensive heart disease with heart failure: Secondary | ICD-10-CM | POA: Insufficient documentation

## 2019-12-16 DIAGNOSIS — M1711 Unilateral primary osteoarthritis, right knee: Secondary | ICD-10-CM | POA: Insufficient documentation

## 2019-12-16 DIAGNOSIS — I509 Heart failure, unspecified: Secondary | ICD-10-CM | POA: Insufficient documentation

## 2019-12-16 DIAGNOSIS — Z01818 Encounter for other preprocedural examination: Secondary | ICD-10-CM | POA: Diagnosis present

## 2019-12-16 DIAGNOSIS — Z8711 Personal history of peptic ulcer disease: Secondary | ICD-10-CM | POA: Insufficient documentation

## 2019-12-16 DIAGNOSIS — Z79899 Other long term (current) drug therapy: Secondary | ICD-10-CM | POA: Diagnosis not present

## 2019-12-16 DIAGNOSIS — I4891 Unspecified atrial fibrillation: Secondary | ICD-10-CM | POA: Diagnosis not present

## 2019-12-16 DIAGNOSIS — G4733 Obstructive sleep apnea (adult) (pediatric): Secondary | ICD-10-CM | POA: Insufficient documentation

## 2019-12-16 LAB — CBC
HCT: 47.7 % (ref 39.0–52.0)
Hemoglobin: 15.9 g/dL (ref 13.0–17.0)
MCH: 29.7 pg (ref 26.0–34.0)
MCHC: 33.3 g/dL (ref 30.0–36.0)
MCV: 89 fL (ref 80.0–100.0)
Platelets: 195 10*3/uL (ref 150–400)
RBC: 5.36 MIL/uL (ref 4.22–5.81)
RDW: 13.2 % (ref 11.5–15.5)
WBC: 5.2 10*3/uL (ref 4.0–10.5)
nRBC: 0 % (ref 0.0–0.2)

## 2019-12-16 LAB — COMPREHENSIVE METABOLIC PANEL
ALT: 16 U/L (ref 0–44)
AST: 20 U/L (ref 15–41)
Albumin: 3.9 g/dL (ref 3.5–5.0)
Alkaline Phosphatase: 54 U/L (ref 38–126)
Anion gap: 11 (ref 5–15)
BUN: 14 mg/dL (ref 8–23)
CO2: 23 mmol/L (ref 22–32)
Calcium: 9.3 mg/dL (ref 8.9–10.3)
Chloride: 103 mmol/L (ref 98–111)
Creatinine, Ser: 1.37 mg/dL — ABNORMAL HIGH (ref 0.61–1.24)
GFR calc Af Amer: >60 mL/min (ref >60)
GFR calc non Af Amer: 55 mL/min — ABNORMAL LOW (ref >60)
Glucose, Bld: 111 mg/dL — ABNORMAL HIGH (ref 70–99)
Potassium: 3.8 mmol/L (ref 3.5–5.1)
Sodium: 137 mmol/L (ref 135–145)
Total Bilirubin: 1.8 mg/dL — ABNORMAL HIGH (ref 0.3–1.2)
Total Protein: 7.7 g/dL (ref 6.5–8.1)

## 2019-12-16 LAB — SURGICAL PCR SCREEN
MRSA, PCR: NEGATIVE
Staphylococcus aureus: NEGATIVE

## 2019-12-16 NOTE — Progress Notes (Signed)
PCP -  Luciano Cutter Cardiologist - Dr. Yates Decamp  PPM/ICD - Denies  Chest x-ray - N/A EKG - 06/09/19 Stress Test - Denies ECHO - 07/14/19 Cardiac Cath - Denies  Sleep Study - Yes CPAP - Yes  Pt denies being diabetic.  Blood Thinner Instructions: Called Dr. Audrie Lia office to verify when pt should stop Xarelto. They asked if pt could call back to verify when Dr. Lajoyce Corners was in the office. Pt aware. Aspirin Instructions: N/A  ERAS Protcol - Yes, PRE-SURGERY Ensure  COVID TEST- 12/21/19   Coronavirus Screening  Have you experienced the following symptoms:  Cough yes/no: No Fever (>100.11F)  yes/no: No Runny nose yes/no: No Sore throat yes/no: No Difficulty breathing/shortness of breath  yes/no: No  Have you or a family member traveled in the last 14 days and where? yes/no: No   If the patient indicates "YES" to the above questions, their PAT will be rescheduled to limit the exposure to others and, the surgeon will be notified. THE PATIENT WILL NEED TO BE ASYMPTOMATIC FOR 14 DAYS.   If the patient is not experiencing any of these symptoms, the PAT nurse will instruct them to NOT bring anyone with them to their appointment since they may have these symptoms or traveled as well.   Please remind your patients and families that hospital visitation restrictions are in effect and the importance of the restrictions.     Anesthesia review: Yes, cardiac hx; previous EKGs abnormal  Patient denies shortness of breath, fever, cough and chest pain at PAT appointment   All instructions explained to the patient, with a verbal understanding of the material. Patient agrees to go over the instructions while at home for a better understanding. Patient also instructed to self quarantine after being tested for COVID-19. The opportunity to ask questions was provided.

## 2019-12-17 NOTE — Progress Notes (Addendum)
Anesthesia Chart Review:  Case: [id number] Date/Time: 12/23/19 0715   Procedure: RIGHT TOTAL KNEE ARTHROPLASTY (Right Knee)   Anesthesia type: Choice   Pre-op diagnosis: Osteoarthritis Right Knee   Location: [facility] OR ROOM 03 / [facility]   Surgeons: Newt Minion, MD      DISCUSSION: Patient is a 64 year old male scheduled for the above procedure.  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 BMI is consistent with morbid obesity.  Last cardiology visit was on 06/09/19 with Jeri Lager, NP. He had not been seen since 2018, so echo updated which showed normal LVEF. Dr. Einar Gip did complete a letter of cardiac clearance (see Letters tab), citing he is "at low risk from a cardiac standpoint" and giving permission to hold Xarelto for 48 hours before surgery and resume the next day if appropriate. Because spinal anesthesia will be considered, I will reach out to cardiology to see if he can hold Xarelto for 72 hours.   Preoperative COVID-19 test is scheduled for 12/21/19. Anesthesia team to evaluate on the day of surgery. He is for PT/INR on the day of surgery.     ADDENDUM 12/18/19 5:06 PM: Dr. Einar Gip is out of the office until 12/21/19, but I was able to communicate with his partner Vernell Leep, MD and was given permission to instruct patient to hold Xarelto for 72 hours prior to surgery. Patient normally takes dose at 10:30 AM daily, so in order to hold Xarelto for a full 72 hours, last dose for 12/23/19 surgery would be on 12/19/19. I have notified patient of this (reportedly Dr. Sharol Given told holding instructions would be up to his PCP or prescriber) and will sent staff message to Dr. Sharol Given. I told patient he would be considered for spinal anesthesia--he will talk with  anesthesiologist on the day of surgery to discuss anesthesia options.    VS: BP (!) 146/83   Pulse 61   Temp 37 C (Oral)   Resp 18   Ht _0  (1.88 m)   Wt (!) 164.5 kg   SpO2 99%   BMI 46.56 kg/m    PROVIDERS: Forrest Moron, MD is listed PCP, but reported PCP as Julian Hy, PA-C [facility]) [name] Prows, MD is Cardiologist [facility])   LABS: Preoperative labs noted. Cr 1.37, down from 1.62 on 03/16/19 (was 1.21-1.34 02/20/18-07/21/18). Total bilirubin 1.8 with normal AST, ALT, alk phos.  (all labs ordered are listed, but only abnormal results are displayed)  Labs Reviewed  COMPREHENSIVE METABOLIC PANEL - Abnormal; Notable for the following components:      Result Value   Glucose, Bld 111 (*)    Creatinine, Ser 1.37 (*)    Total Bilirubin 1.8 (*)    GFR calc non Af Amer 55 (*)    All other components within normal limits  SURGICAL PCR SCREEN  CBC      EKG: EKG 06/09/2019: Normal sinus rhythm at 61 bpm, normal axis, PRWP cannot exclude anterior infarct old. No evidence of ischemia.    CV: Echocardiogram 07/14/2019:  Left ventricle cavity is normal in size. Moderate concentric hypertrophy  of the left ventricle. Normal global wall motion. Normal LV systolic  function with EF 55%. Doppler evidence of grade I (impaired) diastolic  dysfunction, normal LAP.  Left atrial cavity is moderately dilated. 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  Inadequate TR jet to estimate pulmonary artery systolic pressure.Estimated  RA pressure 8 mmHg.  No significant change compared to previous study in 2015.  (Comparison EF 58% 12/03/13; visual EF 35-40% 02/09/13 echo)  Nuclear Stress 02/13/2013 [facility] CV, outlined in cardiology notes): 1.  Resting EKG showed atrial fibrillation, poor R wave progression, nonspecific ST depression and T inversion in inferior leads, cannot rule out ischemia.  Stress EKG  was equivocal for ischemia.  There was 1 mm additional ST depression noted at peak exercise which resolved at less than 2 minutes into recovery.  Patient exercised on Bruce protocol for 5 minutes and 2 seconds.  Maximum workload achieved was 6.8 minutes.  The test was terminated due to achievement of the target heart rate. 2.  Perfusion imaging study demonstrates prominent diaphragmatic attenuation and mild uptake artifact in the inferior, inferoapical and apical anterior wall with no statistically significant ischemia demonstrable bipolar images.  Dynamic getting images reveal normal wall motion suggesting that this is probably soft tissue attenuation and scar.  Ejection fraction 53%.  This is a low risk study.  Echo 02/09/13 (outside record, copy on pt chart): 1.  Left ventricular cavity is normal in size.  Moderate to severe concentric hypertrophy.  Normal global wall motion.  Moderately decreased systolic function.  Calculated EF 43%.  Visual EF of 35 to 40%. 2.  Left atrial cavity moderately dilated. 3.  Mitral valve structurally normal.  Trace mitral regurgitation. 4.  Tricuspid valve structurally normal.  Trace tricuspid regurgitation.  Past Medical History:  Diagnosis Date  . Arthritis    knees  . CHF (congestive heart failure) ([place])     " A long time ago"  . Dysrhythmia    04-21-13 Cardioverted at [facility] for A. Fib-Dr. Einar Gip. No further problems  . GI bleed   . Gout   . Hypertension   . Rotator cuff tear, right   . Sleep apnea    cpap nightly, settings 3    Past Surgical History:  Procedure Laterality Date  . BIOPSY  01/09/2018   Procedure: BIOPSY;  Surgeon: Otis Brace, MD;  Location: [facility];  Service: Gastroenterology;;  . CARDIOVERSION N/A 04/21/2013   Procedure: CARDIOVERSION;  Surgeon: Laverda Page, MD;  Location: [facility] ENDOSCOPY;  Service: Cardiovascular;  Laterality: N/A;  h&p in file- HW  . COLONOSCOPY WITH PROPOFOL N/A 09/04/2013   Procedure:  COLONOSCOPY WITH PROPOFOL;  Surgeon: Winfield Cunas., MD;  Location: [facility] ENDOSCOPY;  Service: Endoscopy;  Laterality: N/A;  . COLONOSCOPY WITH PROPOFOL N/A 06/01/2019   Procedure: COLONOSCOPY WITH PROPOFOL;  Surgeon: Laurence Spates, MD;  Location: [facility] ENDOSCOPY;  Service: Endoscopy;  Laterality: N/A;  . ESOPHAGOGASTRODUODENOSCOPY (EGD) WITH PROPOFOL N/A 01/09/2018   Procedure: ESOPHAGOGASTRODUODENOSCOPY (EGD) WITH PROPOFOL;  Surgeon: Otis Brace, MD;  Location: [facility] ENDOSCOPY;  Service: Gastroenterology;  Laterality: N/A;  . FOOT SURGERY Left   . FRACTURE SURGERY    . JOINT REPLACEMENT Left   . ORIF ANKLE FRACTURE Right   . SHOULDER ARTHROSCOPY WITH ROTATOR CUFF REPAIR AND SUBACROMIAL DECOMPRESSION Right 03/20/2018   Procedure: SHOULDER ARTHROSCOPY WITH ROTATOR CUFF REPAIR AND SUBACROMIAL DECOMPRESSION;  Surgeon: Tania Ade, MD;  Location: [facility];  Service: Orthopedics;  Laterality: Right;    MEDICATIONS: . allopurinol (ZYLOPRIM) 300 MG tablet  . APPLE CIDER VINEGAR PO  . carvedilol (COREG) 25 MG tablet  . cloNIDine (CATAPRES - DOSED IN MG/24 HR) 0.2 mg/24hr patch  .  eplerenone (INSPRA) 25 MG tablet  . furosemide (LASIX) 40 MG tablet  . HYDROcodone-acetaminophen (NORCO) 10-325 MG tablet  . olmesartan (BENICAR) 40 MG tablet  . pantoprazole (PROTONIX) 40 MG tablet  . psyllium (METAMUCIL) 58.6 % powder  . topiramate (TOPAMAX) 25 MG tablet  . vitamin B-12 (CYANOCOBALAMIN) 500 MCG tablet  . XARELTO 15 MG TABS tablet   No current facility-administered medications for this encounter.    Myra Gianotti, PA-C Surgical Short Stay/Anesthesiology [facility] Phone [phone] [facility] Phone [phone] 12/17/2019 6:00 PM

## 2019-12-17 NOTE — Anesthesia Preprocedure Evaluation (Addendum)
Anesthesia Evaluation  Patient identified by MRN, date of birth, ID band Patient awake    Reviewed: Allergy & Precautions, NPO status , Patient's Chart, lab work & pertinent test results, reviewed documented beta blocker date and time   Airway Mallampati: II  TM Distance: >3 FB Neck ROM: Full    Dental no notable dental hx. (+) Teeth Intact   Pulmonary sleep apnea and Continuous Positive Airway Pressure Ventilation ,    Pulmonary exam normal breath sounds clear to auscultation       Cardiovascular hypertension, Pt. on medications and Pt. on home beta blockers +CHF  Normal cardiovascular exam+ dysrhythmias Atrial Fibrillation  Rhythm:Regular Rate:Normal     Neuro/Psych anosomia negative psych ROS   GI/Hepatic Neg liver ROS, GERD  Medicated and Controlled,  Endo/Other  Morbid obesity  Renal/GU Renal InsufficiencyRenal disease  negative genitourinary   Musculoskeletal  (+) Arthritis , Osteoarthritis,  OA right kenn   Abdominal (+) + obese,   Peds  Hematology xarelto therapy- last dose 5/22   Anesthesia Other Findings   Reproductive/Obstetrics                            Anesthesia Physical Anesthesia Plan  ASA: III  Anesthesia Plan: Spinal   Post-op Pain Management:  Regional for Post-op pain   Induction: Intravenous  PONV Risk Score and Plan: 2 and Treatment may vary due to age or medical condition, Ondansetron, Midazolam and Propofol infusion  Airway Management Planned: Natural Airway and Simple Face Mask  Additional Equipment:   Intra-op Plan:   Post-operative Plan:   Informed Consent: I have reviewed the patients History and Physical, chart, labs and discussed the procedure including the risks, benefits and alternatives for the proposed anesthesia with the patient or authorized representative who has indicated his/her understanding and acceptance.       Plan Discussed with:  CRNA and Surgeon  Anesthesia Plan Comments: (PAT note written by Shonna Chock, PA-C. )      Anesthesia Quick Evaluation

## 2019-12-21 ENCOUNTER — Other Ambulatory Visit: Payer: Self-pay

## 2019-12-21 ENCOUNTER — Other Ambulatory Visit
Admission: RE | Admit: 2019-12-21 | Discharge: 2019-12-21 | Disposition: A | Discharge status: Home / Self Care | Payer: Medicare Other | Source: Ambulatory Visit | Attending: Orthopedic Surgery | Admitting: Orthopedic Surgery

## 2019-12-21 DIAGNOSIS — Z20822 Contact with and (suspected) exposure to covid-19: Secondary | ICD-10-CM | POA: Insufficient documentation

## 2019-12-21 DIAGNOSIS — Z01812 Encounter for preprocedural laboratory examination: Secondary | ICD-10-CM | POA: Insufficient documentation

## 2019-12-21 LAB — SARS CORONAVIRUS 2 (TAT 6-24 HRS): SARS Coronavirus 2: NEGATIVE

## 2019-12-22 MED ORDER — DEXTROSE 5 % IV SOLN
3.0000 g | INTRAVENOUS | Status: AC
  Administered 2019-12-23: 3 g via INTRAVENOUS
  Filled 2019-12-22: qty 3000
  Filled 2019-12-22: qty 3

## 2019-12-23 ENCOUNTER — Ambulatory Visit (HOSPITAL_COMMUNITY): Payer: Medicare Other | Admitting: Vascular Surgery

## 2019-12-23 ENCOUNTER — Encounter (HOSPITAL_COMMUNITY)
Admission: RE | Disposition: A | Discharge status: Home Health | Payer: Self-pay | Source: Home / Self Care | Attending: Orthopedic Surgery

## 2019-12-23 ENCOUNTER — Encounter (HOSPITAL_COMMUNITY): Payer: Self-pay | Admitting: Orthopedic Surgery

## 2019-12-23 ENCOUNTER — Inpatient Hospital Stay (HOSPITAL_COMMUNITY)
Admission: RE | Admit: 2019-12-23 | Discharge: 2019-12-25 | DRG: 470 | Disposition: A | Discharge status: Home Health | Payer: Medicare Other | Attending: Orthopedic Surgery | Admitting: Orthopedic Surgery

## 2019-12-23 ENCOUNTER — Other Ambulatory Visit: Payer: Self-pay

## 2019-12-23 DIAGNOSIS — M1711 Unilateral primary osteoarthritis, right knee: Secondary | ICD-10-CM | POA: Diagnosis not present

## 2019-12-23 DIAGNOSIS — Z6841 Body Mass Index (BMI) 40.0 and over, adult: Secondary | ICD-10-CM

## 2019-12-23 DIAGNOSIS — I509 Heart failure, unspecified: Secondary | ICD-10-CM | POA: Diagnosis present

## 2019-12-23 DIAGNOSIS — Z7901 Long term (current) use of anticoagulants: Secondary | ICD-10-CM

## 2019-12-23 DIAGNOSIS — I110 Hypertensive heart disease with heart failure: Secondary | ICD-10-CM | POA: Diagnosis present

## 2019-12-23 DIAGNOSIS — K219 Gastro-esophageal reflux disease without esophagitis: Secondary | ICD-10-CM | POA: Diagnosis present

## 2019-12-23 DIAGNOSIS — Z20822 Contact with and (suspected) exposure to covid-19: Secondary | ICD-10-CM | POA: Diagnosis present

## 2019-12-23 DIAGNOSIS — I4891 Unspecified atrial fibrillation: Secondary | ICD-10-CM | POA: Diagnosis present

## 2019-12-23 DIAGNOSIS — Z96652 Presence of left artificial knee joint: Secondary | ICD-10-CM | POA: Diagnosis present

## 2019-12-23 HISTORY — PX: TOTAL KNEE ARTHROPLASTY: SHX125

## 2019-12-23 LAB — PROTIME-INR
INR: 1.1 (ref 0.8–1.2)
Prothrombin Time: 14.2 seconds (ref 11.4–15.2)

## 2019-12-23 SURGERY — ARTHROPLASTY, KNEE, TOTAL
Anesthesia: Regional | Site: Knee | Laterality: Right

## 2019-12-23 MED ORDER — ONDANSETRON HCL 4 MG/2ML IJ SOLN
INTRAMUSCULAR | Status: DC | PRN
  Administered 2019-12-23: 4 mg via INTRAVENOUS

## 2019-12-23 MED ORDER — HYDROMORPHONE HCL 1 MG/ML IJ SOLN
INTRAMUSCULAR | Status: AC
  Filled 2019-12-23: qty 1

## 2019-12-23 MED ORDER — ORAL CARE MOUTH RINSE: 15.0000 mL | Freq: Once | OROMUCOSAL | Status: AC

## 2019-12-23 MED ORDER — DEXAMETHASONE SODIUM PHOSPHATE 10 MG/ML IJ SOLN
INTRAMUSCULAR | Status: AC
  Filled 2019-12-23: qty 2

## 2019-12-23 MED ORDER — PROPOFOL 10 MG/ML IV BOLUS
INTRAVENOUS | Status: DC | PRN
  Administered 2019-12-23: 250 mg via INTRAVENOUS

## 2019-12-23 MED ORDER — DEXAMETHASONE SODIUM PHOSPHATE 10 MG/ML IJ SOLN
INTRAMUSCULAR | Status: DC | PRN
  Administered 2019-12-23: 10 mg via INTRAVENOUS

## 2019-12-23 MED ORDER — SUGAMMADEX SODIUM 200 MG/2ML IV SOLN
INTRAVENOUS | Status: DC | PRN
  Administered 2019-12-23: 350 mg via INTRAVENOUS

## 2019-12-23 MED ORDER — ROCURONIUM BROMIDE 100 MG/10ML IV SOLN
INTRAVENOUS | Status: DC | PRN
  Administered 2019-12-23: 80 mg via INTRAVENOUS

## 2019-12-23 MED ORDER — LIDOCAINE 2% (20 MG/ML) 5 ML SYRINGE
INTRAMUSCULAR | Status: AC
  Filled 2019-12-23: qty 5

## 2019-12-23 MED ORDER — ONDANSETRON HCL 4 MG PO TABS: 4.0000 mg | ORAL_TABLET | Freq: Four times a day (QID) | ORAL | Status: DC | PRN

## 2019-12-23 MED ORDER — ACETAMINOPHEN 325 MG PO TABS: 325.0000 mg | ORAL_TABLET | Freq: Four times a day (QID) | ORAL | Status: DC | PRN

## 2019-12-23 MED ORDER — EPHEDRINE SULFATE 50 MG/ML IJ SOLN
INTRAMUSCULAR | Status: DC | PRN
  Administered 2019-12-23: 10 mg via INTRAVENOUS
  Administered 2019-12-23: 5 mg via INTRAVENOUS

## 2019-12-23 MED ORDER — 0.9 % SODIUM CHLORIDE (POUR BTL) OPTIME
TOPICAL | Status: DC | PRN
  Administered 2019-12-23: 1000 mL

## 2019-12-23 MED ORDER — OXYCODONE HCL 5 MG PO TABS
ORAL_TABLET | ORAL | Status: AC
  Filled 2019-12-23: qty 1

## 2019-12-23 MED ORDER — MIDAZOLAM HCL 5 MG/5ML IJ SOLN
INTRAMUSCULAR | Status: DC | PRN
  Administered 2019-12-23: 2 mg via INTRAVENOUS

## 2019-12-23 MED ORDER — PHENYLEPHRINE 40 MCG/ML (10ML) SYRINGE FOR IV PUSH (FOR BLOOD PRESSURE SUPPORT)
PREFILLED_SYRINGE | INTRAVENOUS | Status: AC
  Filled 2019-12-23: qty 10

## 2019-12-23 MED ORDER — KETOROLAC TROMETHAMINE 15 MG/ML IJ SOLN
7.5000 mg | Freq: Four times a day (QID) | INTRAMUSCULAR | Status: AC
  Administered 2019-12-23 – 2019-12-24 (×4): 7.5 mg via INTRAVENOUS
  Filled 2019-12-23 (×3): qty 1

## 2019-12-23 MED ORDER — FUROSEMIDE 40 MG PO TABS: 40.0000 mg | ORAL_TABLET | Freq: Every day | ORAL | Status: DC | PRN

## 2019-12-23 MED ORDER — MENTHOL 3 MG MT LOZG: 1.0000 | LOZENGE | OROMUCOSAL | Status: DC | PRN

## 2019-12-23 MED ORDER — ROCURONIUM BROMIDE 10 MG/ML (PF) SYRINGE
PREFILLED_SYRINGE | INTRAVENOUS | Status: AC
  Filled 2019-12-23: qty 10

## 2019-12-23 MED ORDER — CHLORHEXIDINE GLUCONATE 0.12 % MT SOLN
15.0000 mL | Freq: Once | OROMUCOSAL | Status: AC
  Administered 2019-12-23: 15 mL via OROMUCOSAL
  Filled 2019-12-23: qty 15

## 2019-12-23 MED ORDER — FENTANYL CITRATE (PF) 250 MCG/5ML IJ SOLN
INTRAMUSCULAR | Status: AC
  Filled 2019-12-23: qty 5

## 2019-12-23 MED ORDER — ONDANSETRON HCL 4 MG/2ML IJ SOLN: 4.0000 mg | Freq: Four times a day (QID) | INTRAMUSCULAR | Status: DC | PRN

## 2019-12-23 MED ORDER — ROPIVACAINE HCL 7.5 MG/ML IJ SOLN
INTRAMUSCULAR | Status: DC | PRN
  Administered 2019-12-23: 20 mL via PERINEURAL

## 2019-12-23 MED ORDER — GLYCOPYRROLATE 0.2 MG/ML IJ SOLN
INTRAMUSCULAR | Status: DC | PRN
  Administered 2019-12-23: .2 mg via INTRAVENOUS

## 2019-12-23 MED ORDER — METHOCARBAMOL 500 MG PO TABS
ORAL_TABLET | ORAL | Status: AC
  Administered 2019-12-23: 500 mg
  Filled 2019-12-23: qty 1

## 2019-12-23 MED ORDER — PROPOFOL 10 MG/ML IV BOLUS
INTRAVENOUS | Status: AC
  Filled 2019-12-23: qty 20

## 2019-12-23 MED ORDER — LACTATED RINGERS IV SOLN: INTRAVENOUS | Status: DC | PRN

## 2019-12-23 MED ORDER — PANTOPRAZOLE SODIUM 40 MG PO TBEC
40.0000 mg | DELAYED_RELEASE_TABLET | Freq: Every day | ORAL | Status: DC
  Administered 2019-12-24 – 2019-12-25 (×2): 40 mg via ORAL
  Filled 2019-12-23 (×2): qty 1

## 2019-12-23 MED ORDER — CARVEDILOL 25 MG PO TABS
25.0000 mg | ORAL_TABLET | Freq: Two times a day (BID) | ORAL | Status: DC
  Administered 2019-12-24 – 2019-12-25 (×3): 25 mg via ORAL
  Filled 2019-12-23 (×3): qty 1

## 2019-12-23 MED ORDER — ALLOPURINOL 300 MG PO TABS
300.0000 mg | ORAL_TABLET | Freq: Every day | ORAL | Status: DC
  Administered 2019-12-24 – 2019-12-25 (×2): 300 mg via ORAL
  Filled 2019-12-23 (×2): qty 1

## 2019-12-23 MED ORDER — METOCLOPRAMIDE HCL 5 MG/ML IJ SOLN: 5.0000 mg | Freq: Three times a day (TID) | INTRAMUSCULAR | Status: DC | PRN

## 2019-12-23 MED ORDER — PHENOL 1.4 % MT LIQD: 1.0000 | OROMUCOSAL | Status: DC | PRN

## 2019-12-23 MED ORDER — OXYCODONE HCL 5 MG PO TABS
5.0000 mg | ORAL_TABLET | ORAL | Status: DC | PRN
  Administered 2019-12-23: 5 mg via ORAL
  Administered 2019-12-24: 10 mg via ORAL
  Administered 2019-12-24: 5 mg via ORAL
  Administered 2019-12-25 (×2): 10 mg via ORAL
  Filled 2019-12-23 (×3): qty 2
  Filled 2019-12-23: qty 1

## 2019-12-23 MED ORDER — CEFAZOLIN SODIUM-DEXTROSE 2-4 GM/100ML-% IV SOLN
2.0000 g | Freq: Four times a day (QID) | INTRAVENOUS | Status: AC
  Administered 2019-12-23 (×2): 2 g via INTRAVENOUS
  Filled 2019-12-23: qty 100

## 2019-12-23 MED ORDER — RIVAROXABAN 15 MG PO TABS
15.0000 mg | ORAL_TABLET | Freq: Every day | ORAL | Status: DC
  Administered 2019-12-24: 15 mg via ORAL
  Filled 2019-12-23: qty 1

## 2019-12-23 MED ORDER — SODIUM CHLORIDE 0.9 % IV SOLN: INTRAVENOUS | Status: DC

## 2019-12-23 MED ORDER — PROPOFOL 500 MG/50ML IV EMUL
INTRAVENOUS | Status: DC | PRN
  Administered 2019-12-23: 75 ug/kg/min via INTRAVENOUS

## 2019-12-23 MED ORDER — METOCLOPRAMIDE HCL 5 MG PO TABS: 5.0000 mg | ORAL_TABLET | Freq: Three times a day (TID) | ORAL | Status: DC | PRN

## 2019-12-23 MED ORDER — TRANEXAMIC ACID 1000 MG/10ML IV SOLN
2000.0000 mg | Freq: Once | INTRAVENOUS | Status: AC
  Administered 2019-12-23: 1000 mg via TOPICAL
  Filled 2019-12-23: qty 20

## 2019-12-23 MED ORDER — CEFAZOLIN SODIUM-DEXTROSE 2-4 GM/100ML-% IV SOLN
INTRAVENOUS | Status: AC
  Filled 2019-12-23: qty 100

## 2019-12-23 MED ORDER — METHOCARBAMOL 500 MG PO TABS
500.0000 mg | ORAL_TABLET | Freq: Four times a day (QID) | ORAL | Status: DC | PRN
  Administered 2019-12-24 – 2019-12-25 (×3): 500 mg via ORAL
  Filled 2019-12-23 (×3): qty 1

## 2019-12-23 MED ORDER — ONDANSETRON HCL 4 MG/2ML IJ SOLN: 4.0000 mg | Freq: Once | INTRAMUSCULAR | Status: DC | PRN

## 2019-12-23 MED ORDER — EPHEDRINE 5 MG/ML INJ
INTRAVENOUS | Status: AC
  Filled 2019-12-23: qty 10

## 2019-12-23 MED ORDER — SODIUM CHLORIDE 0.9 % IR SOLN
Status: DC | PRN
  Administered 2019-12-23: 3000 mL

## 2019-12-23 MED ORDER — MIDAZOLAM HCL 2 MG/2ML IJ SOLN
INTRAMUSCULAR | Status: AC
  Filled 2019-12-23: qty 2

## 2019-12-23 MED ORDER — ONDANSETRON HCL 4 MG/2ML IJ SOLN
INTRAMUSCULAR | Status: AC
  Filled 2019-12-23: qty 2

## 2019-12-23 MED ORDER — METHOCARBAMOL 1000 MG/10ML IJ SOLN
500.0000 mg | Freq: Four times a day (QID) | INTRAVENOUS | Status: DC | PRN
  Filled 2019-12-23: qty 5

## 2019-12-23 MED ORDER — TRANEXAMIC ACID 1000 MG/10ML IV SOLN
INTRAVENOUS | Status: DC | PRN
  Administered 2019-12-23: 2000 mg via TOPICAL

## 2019-12-23 MED ORDER — KETOROLAC TROMETHAMINE 15 MG/ML IJ SOLN
INTRAMUSCULAR | Status: AC
  Filled 2019-12-23: qty 1

## 2019-12-23 MED ORDER — IRBESARTAN 75 MG PO TABS
37.5000 mg | ORAL_TABLET | Freq: Every day | ORAL | Status: DC
  Administered 2019-12-23 – 2019-12-25 (×3): 37.5 mg via ORAL
  Filled 2019-12-23: qty 0.5
  Filled 2019-12-23 (×2): qty 1

## 2019-12-23 MED ORDER — HYDROMORPHONE HCL 1 MG/ML IJ SOLN
0.2500 mg | INTRAMUSCULAR | Status: DC | PRN
  Administered 2019-12-23 (×4): 0.5 mg via INTRAVENOUS

## 2019-12-23 MED ORDER — TRANEXAMIC ACID-NACL 1000-0.7 MG/100ML-% IV SOLN
INTRAVENOUS | Status: AC
  Filled 2019-12-23: qty 100

## 2019-12-23 MED ORDER — HYDROMORPHONE HCL 1 MG/ML IJ SOLN
0.5000 mg | INTRAMUSCULAR | Status: DC | PRN
  Administered 2019-12-24: 1 mg via INTRAVENOUS
  Filled 2019-12-23: qty 1

## 2019-12-23 MED ORDER — DOCUSATE SODIUM 100 MG PO CAPS
100.0000 mg | ORAL_CAPSULE | Freq: Two times a day (BID) | ORAL | Status: DC
  Administered 2019-12-23 – 2019-12-25 (×5): 100 mg via ORAL
  Filled 2019-12-23 (×5): qty 1

## 2019-12-23 MED ORDER — SUCCINYLCHOLINE CHLORIDE 20 MG/ML IJ SOLN
INTRAMUSCULAR | Status: DC | PRN
  Administered 2019-12-23: 160 mg via INTRAVENOUS

## 2019-12-23 MED ORDER — SUCCINYLCHOLINE CHLORIDE 200 MG/10ML IV SOSY
PREFILLED_SYRINGE | INTRAVENOUS | Status: AC
  Filled 2019-12-23: qty 10

## 2019-12-23 MED ORDER — FENTANYL CITRATE (PF) 100 MCG/2ML IJ SOLN
INTRAMUSCULAR | Status: DC | PRN
  Administered 2019-12-23: 50 ug via INTRAVENOUS
  Administered 2019-12-23: 25 ug via INTRAVENOUS
  Administered 2019-12-23 (×2): 50 ug via INTRAVENOUS
  Administered 2019-12-23: 25 ug via INTRAVENOUS
  Administered 2019-12-23: 50 ug via INTRAVENOUS
  Administered 2019-12-23: 100 ug via INTRAVENOUS

## 2019-12-23 MED ORDER — CYANOCOBALAMIN 500 MCG PO TABS
500.0000 ug | ORAL_TABLET | Freq: Every day | ORAL | Status: DC
  Administered 2019-12-23 – 2019-12-25 (×3): 500 ug via ORAL
  Filled 2019-12-23 (×3): qty 1

## 2019-12-23 SURGICAL SUPPLY — 53 items
ATTUNE MED DOME PAT 41 KNEE (Knees) ×2 IMPLANT
ATTUNE PS FEM RT SZ 8 CEM KNEE (Femur) ×2 IMPLANT
BASEPLATE TIB CMT FB PCKT SZ 8 (Knees) ×2 IMPLANT
BLADE SAGITTAL 25.0X1.19X90 (BLADE) ×2 IMPLANT
BLADE SAW SGTL 13X75X1.27 (BLADE) ×2 IMPLANT
BLADE SURG 21 STRL SS (BLADE) ×4 IMPLANT
BNDG COHESIVE 6X5 TAN STRL LF (GAUZE/BANDAGES/DRESSINGS) ×4 IMPLANT
BNDG GAUZE ELAST 4 BULKY (GAUZE/BANDAGES/DRESSINGS) ×2 IMPLANT
BOWL SMART MIX CTS (DISPOSABLE) ×2 IMPLANT
CANISTER WOUNDNEG PRESSURE 500 (CANNISTER) ×2 IMPLANT
CEMENT BONE R 1X40 (Cement) ×4 IMPLANT
COVER SURGICAL LIGHT HANDLE (MISCELLANEOUS) ×2 IMPLANT
COVER WAND RF STERILE (DRAPES) ×2 IMPLANT
CUFF TOURN SGL QUICK 34 (TOURNIQUET CUFF) ×1
CUFF TOURN SGL QUICK 42 (TOURNIQUET CUFF) IMPLANT
CUFF TRNQT CYL 34X4.125X (TOURNIQUET CUFF) ×1 IMPLANT
DRAPE EXTREMITY T 121X128X90 (DISPOSABLE) ×2 IMPLANT
DRAPE HALF SHEET 40X57 (DRAPES) ×4 IMPLANT
DRAPE U-SHAPE 47X51 STRL (DRAPES) ×2 IMPLANT
DRSG ADAPTIC 3X8 NADH LF (GAUZE/BANDAGES/DRESSINGS) ×2 IMPLANT
DRSG PAD ABDOMINAL 8X10 ST (GAUZE/BANDAGES/DRESSINGS) ×2 IMPLANT
DURAPREP 26ML APPLICATOR (WOUND CARE) ×2 IMPLANT
ELECT REM PT RETURN 9FT ADLT (ELECTROSURGICAL) ×2
ELECTRODE REM PT RTRN 9FT ADLT (ELECTROSURGICAL) ×1 IMPLANT
FACESHIELD WRAPAROUND (MASK) ×2 IMPLANT
GAUZE SPONGE 4X4 12PLY STRL (GAUZE/BANDAGES/DRESSINGS) ×2 IMPLANT
GLOVE BIOGEL PI IND STRL 9 (GLOVE) ×1 IMPLANT
GLOVE BIOGEL PI INDICATOR 9 (GLOVE) ×1
GLOVE SURG ORTHO 9.0 STRL STRW (GLOVE) ×2 IMPLANT
GOWN STRL REUS W/ TWL XL LVL3 (GOWN DISPOSABLE) ×2 IMPLANT
GOWN STRL REUS W/TWL XL LVL3 (GOWN DISPOSABLE) ×2
HANDPIECE INTERPULSE COAX TIP (DISPOSABLE) ×1
INSERT TIBIA FIXED BEARING SZ8 (Insert) ×2 IMPLANT
KIT BASIN OR (CUSTOM PROCEDURE TRAY) ×2 IMPLANT
KIT DRSG PREVENA PLUS 7DAY 125 (MISCELLANEOUS) ×2 IMPLANT
KIT TURNOVER KIT B (KITS) ×2 IMPLANT
MANIFOLD NEPTUNE II (INSTRUMENTS) ×2 IMPLANT
NS IRRIG 1000ML POUR BTL (IV SOLUTION) ×2 IMPLANT
PACK TOTAL JOINT (CUSTOM PROCEDURE TRAY) ×2 IMPLANT
PAD ARMBOARD 7.5X6 YLW CONV (MISCELLANEOUS) ×2 IMPLANT
PIN DRILL FIX HALF THREAD (BIT) ×2 IMPLANT
PIN STEINMAN FIXATION KNEE (PIN) ×2 IMPLANT
SET HNDPC FAN SPRY TIP SCT (DISPOSABLE) ×1 IMPLANT
STAPLER VISISTAT 35W (STAPLE) ×4 IMPLANT
SUCTION FRAZIER HANDLE 10FR (MISCELLANEOUS)
SUCTION TUBE FRAZIER 10FR DISP (MISCELLANEOUS) IMPLANT
SUT VIC AB 0 CT1 27 (SUTURE) ×2
SUT VIC AB 0 CT1 27XBRD ANBCTR (SUTURE) ×2 IMPLANT
SUT VIC AB 1 CTX 36 (SUTURE)
SUT VIC AB 1 CTX36XBRD ANBCTR (SUTURE) IMPLANT
TOWEL GREEN STERILE (TOWEL DISPOSABLE) ×2 IMPLANT
TOWEL GREEN STERILE FF (TOWEL DISPOSABLE) ×2 IMPLANT
WRAP KNEE MAXI GEL POST OP (GAUZE/BANDAGES/DRESSINGS) ×2 IMPLANT

## 2019-12-23 NOTE — Anesthesia Postprocedure Evaluation (Signed)
Anesthesia Post Note  Patient: Leon Thomas  Procedure(s) Performed: RIGHT TOTAL KNEE ARTHROPLASTY (Right Knee)     Patient location during evaluation: PACU Anesthesia Type: Regional and General Level of consciousness: awake and alert Pain management: pain level controlled Vital Signs Assessment: post-procedure vital signs reviewed and stable Respiratory status: spontaneous breathing, nonlabored ventilation, respiratory function stable and patient connected to nasal cannula oxygen Cardiovascular status: blood pressure returned to baseline and stable Postop Assessment: no apparent nausea or vomiting Anesthetic complications: no    Last Vitals:  Vitals:   12/23/19 1020 12/23/19 1035  BP:  130/79  Pulse: (!) 56 (!) 58  Resp: 17 14  Temp:    SpO2: 99% 96%                 Angelea Penny,W. EDMOND

## 2019-12-23 NOTE — Transfer of Care (Signed)
Immediate Anesthesia Transfer of Care Note  Patient: Leon Thomas  Procedure(s) Performed: RIGHT TOTAL KNEE ARTHROPLASTY (Right Knee)  Patient Location: PACU  Anesthesia Type:General and Regional  Level of Consciousness: awake, alert , oriented and patient cooperative  Airway & Oxygen Therapy: Patient Spontanous Breathing and Patient connected to nasal cannula oxygen  Post-op Assessment: Report given to RN and Post -op Vital signs reviewed and stable  Post vital signs: Reviewed and stable  Last Vitals:  Vitals Value Taken Time  BP 100/50 12/23/19 0952  Temp    Pulse 62 12/23/19 0955  Resp 16 12/23/19 0955  SpO2 98 % 12/23/19 0955  Vitals shown include unvalidated device data.  Last Pain:  Vitals:   12/23/19 0607  PainSc: 9          Complications: No apparent anesthesia complications

## 2019-12-23 NOTE — Progress Notes (Addendum)
PT Cancellation Note  Patient Details Name: Leon Thomas MRN: [id number] DOB: July 03, 1956   Cancelled Treatment:    Reason Eval/Treat Not Completed: Other (comment) Order released at 1038, however, pt not up to room at 1340. Will follow up as schedule allows.   Attempt #2: Pt not in room at 1638. Will continue to follow up as schedule allows.   Attempt #3: Pt not in room at 1728. Will reattempt as schedule allows.    Farley Ly, PT, DPT  Acute Rehabilitation Services  Pager: [phone] Office: [phone]    [name] 12/23/2019, 1:40 PM

## 2019-12-23 NOTE — Discharge Instructions (Signed)
INSTRUCTIONS AFTER JOINT REPLACEMENT   o Remove items at home which could result in a fall. This includes throw rugs or furniture in walking pathways o ICE to the affected joint every three hours while awake for 30 minutes at a time, for at least the first 3-5 days, and then as needed for pain and swelling.  Continue to use ice for pain and swelling. You may notice swelling that will progress down to the foot and ankle.  This is normal after surgery.  Elevate your leg when you are not up walking on it.   o Continue to use the breathing machine you got in the hospital (incentive spirometer) which will help keep your temperature down.  It is common for your temperature to cycle up and down following surgery, especially at night when you are not up moving around and exerting yourself.  The breathing machine keeps your lungs expanded and your temperature down.   DIET:  As you were doing prior to hospitalization, we recommend a well-balanced diet.  DRESSING / WOUND CARE / SHOWERING  Keep Wound Vac in Place and keep Dry. Wound Vac will be removed at 1st post op visit in 1 week. You will be shown ho to attach vac ACTIVITY  o Increase activity slowly as tolerated, but follow the weight bearing instructions below.   o No driving for 6 weeks or until further direction given by your physician.  You cannot drive while taking narcotics.  o No lifting or carrying greater than 10 lbs. until further directed by your surgeon. o Avoid periods of inactivity such as sitting longer than an hour when not asleep. This helps prevent blood clots.  o You may return to work once you are authorized by your doctor.     WEIGHT BEARING   Weight bearing as tolerated with assist device (walker, cane, etc) as directed, use it as long as suggested by your surgeon or therapist, typically at least 4-6 weeks.   EXERCISES  Results after joint replacement surgery are often greatly improved when you follow the exercise, range  of motion and muscle strengthening exercises prescribed by your doctor. Safety measures are also important to protect the joint from further injury. Any time any of these exercises cause you to have increased pain or swelling, decrease what you are doing until you are comfortable again and then slowly increase them. If you have problems or questions, call your caregiver or physical therapist for advice.   Rehabilitation is important following a joint replacement. After just a few days of immobilization, the muscles of the leg can become weakened and shrink (atrophy).  These exercises are designed to build up the tone and strength of the thigh and leg muscles and to improve motion. Often times heat used for twenty to thirty minutes before working out will loosen up your tissues and help with improving the range of motion but do not use heat for the first two weeks following surgery (sometimes heat can increase post-operative swelling).   These exercises can be done on a training (exercise) mat, on the floor, on a table or on a bed. Use whatever works the best and is most comfortable for you.    Use music or television while you are exercising so that the exercises are a pleasant break in your day. This will make your life better with the exercises acting as a break in your routine that you can look forward to.   Perform all exercises about fifteen times, three  times per day or as directed.  You should exercise both the operative leg and the other leg as well.  Exercises include:    Quad Sets - Tighten up the muscle on the front of the thigh (Quad) and hold for 5-10 seconds.    Straight Leg Raises - With your knee straight (if you were given a brace, keep it on), lift the leg to 60 degrees, hold for 3 seconds, and slowly lower the leg.  Perform this exercise against resistance later as your leg gets stronger.   Leg Slides: Lying on your back, slowly slide your foot toward your buttocks, bending your knee  up off the floor (only go as far as is comfortable). Then slowly slide your foot back down until your leg is flat on the floor again.   Angel Wings: Lying on your back spread your legs to the side as far apart as you can without causing discomfort.   Hamstring Strength:  Lying on your back, push your heel against the floor with your leg straight by tightening up the muscles of your buttocks.  Repeat, but this time bend your knee to a comfortable angle, and push your heel against the floor.  You may put a pillow under the heel to make it more comfortable if necessary.   A rehabilitation program following joint replacement surgery can speed recovery and prevent re-injury in the future due to weakened muscles. Contact your doctor or a physical therapist for more information on knee rehabilitation.    CONSTIPATION  Constipation is defined medically as fewer than three stools per week and severe constipation as less than one stool per week.  Even if you have a regular bowel pattern at home, your normal regimen is likely to be disrupted due to multiple reasons following surgery.  Combination of anesthesia, postoperative narcotics, change in appetite and fluid intake all can affect your bowels.   YOU MUST use at least one of the following options; they are listed in order of increasing strength to get the job done.  They are all available over the counter, and you may need to use some, POSSIBLY even all of these options:    Drink plenty of fluids (prune juice may be helpful) and high fiber foods Colace 100 mg by mouth twice a day  Senokot for constipation as directed and as needed Dulcolax (bisacodyl), take with full glass of water  Miralax (polyethylene glycol) once or twice a day as needed.  If you have tried all these things and are unable to have a bowel movement in the first 3-4 days after surgery call either your surgeon or your primary doctor.    If you experience loose stools or diarrhea, hold  the medications until you stool forms back up.  If your symptoms do not get better within 1 week or if they get worse, check with your doctor.  If you experience "the worst abdominal pain ever" or develop nausea or vomiting, please contact the office immediately for further recommendations for treatment.   ITCHING:  If you experience itching with your medications, try taking only a single pain pill, or even half a pain pill at a time.  You can also use Benadryl over the counter for itching or also to help with sleep.   TED HOSE STOCKINGS:  Use stockings on both legs until for at least 2 weeks or as directed by physician office. They may be removed at night for sleeping.  MEDICATIONS:  See your medication  summary on the After Visit Summary that nursing will review with you.  You may have some home medications which will be placed on hold until you complete the course of blood thinner medication.  It is important for you to complete the blood thinner medication as prescribed.  PRECAUTIONS:  If you experience chest pain or shortness of breath - call 911 immediately for transfer to the hospital emergency department.   If you develop a fever greater that 101 F, purulent drainage from wound, increased redness or drainage from wound, foul odor from the wound/dressing, or calf pain - CONTACT YOUR SURGEON.                                                   FOLLOW-UP APPOINTMENTS:  If you do not already have a post-op appointment, please call the office for an appointment to be seen by your surgeon.  Guidelines for how soon to be seen are listed in your After Visit Summary, but are typically between 1-4 weeks after surgery.  OTHER INSTRUCTIONS:   Knee Replacement:  Do not place pillow under knee, focus on keeping the knee straight while resting.  DENTAL ANTIBIOTICS:  In most cases prophylactic antibiotics for Dental procdeures after total joint surgery are not necessary.  Exceptions are as  follows:  1. History of prior total joint infection  2. Severely immunocompromised (Organ Transplant, cancer chemotherapy, Rheumatoid biologic meds such as Humera)  3. Poorly controlled diabetes (A1C &gt; 8.0, blood glucose over 200)  If you have one of these conditions, contact your surgeon for an antibiotic prescription, prior to your dental procedure.   MAKE SURE YOU:   Understand these instructions.   Get help right away if you are not doing well or get worse.    Thank you for letting us be a part of your medical care team.  It is a privilege we respect greatly.  We hope these instructions will help you stay on track for a fast and full recovery!

## 2019-12-23 NOTE — H&P (Signed)
Leon Thomas is an 64 y.o. male.   Chief Complaint: Right Knee Pain HPI: Patient is a 64 year old gentleman who presents with end-stage osteoarthritis right knee he has had an injection several weeks ago without any improvement.  He is currently ambulating with a cane.  Patient states that he has had his knee give out on him he fell and sustained an injury to his left total knee replacement.  Past Medical History:  Diagnosis Date  . Arthritis    knees  . CHF (congestive heart failure) (HCC)     " A long time ago"  . Dysrhythmia    04-21-13 Cardioverted at [facility]-Dr. Jacinto Halim. No further problems  . GI bleed   . Gout   . Hypertension   . Rotator cuff tear, right   . Sleep apnea    cpap nightly, settings 3    Past Surgical History:  Procedure Laterality Date  . BIOPSY  01/09/2018   Procedure: BIOPSY;  Surgeon: Kathi Der, MD;  Location: [facility];  Service: Gastroenterology;;  . CARDIOVERSION N/A 04/21/2013   Procedure: CARDIOVERSION;  Surgeon: [name], MD;  Location: [facility] ENDOSCOPY;  Service: Cardiovascular;  Laterality: N/A;  h&p in file- HW  . COLONOSCOPY WITH PROPOFOL N/A 09/04/2013   Procedure: COLONOSCOPY WITH PROPOFOL;  Surgeon: Vertell Novak., MD;  Location: [facility] ENDOSCOPY;  Service: Endoscopy;  Laterality: N/A;  . COLONOSCOPY WITH PROPOFOL N/A 06/01/2019   Procedure: COLONOSCOPY WITH PROPOFOL;  Surgeon: Carman Ching, MD;  Location: [facility] ENDOSCOPY;  Service: Endoscopy;  Laterality: N/A;  . ESOPHAGOGASTRODUODENOSCOPY (EGD) WITH PROPOFOL N/A 01/09/2018   Procedure: ESOPHAGOGASTRODUODENOSCOPY (EGD) WITH PROPOFOL;  Surgeon: Kathi Der, MD;  Location: [facility] ENDOSCOPY;  Service: Gastroenterology;  Laterality: N/A;  . FOOT SURGERY Left   . FRACTURE SURGERY    . JOINT REPLACEMENT Left   . ORIF ANKLE FRACTURE Right   . SHOULDER ARTHROSCOPY WITH ROTATOR CUFF REPAIR AND SUBACROMIAL DECOMPRESSION Right 03/20/2018   Procedure: SHOULDER ARTHROSCOPY WITH  ROTATOR CUFF REPAIR AND SUBACROMIAL DECOMPRESSION;  Surgeon: [name], MD;  Location: [facility] OR;  Service: Orthopedics;  Laterality: Right;    Family History  Problem Relation Age of Onset  . Diabetes Mother   . Alzheimer's disease Mother   . Diabetes Father   . Diabetes Brother   . Stroke Brother   . Stroke Sister    Social History:  reports that he has never smoked. He has never used smokeless tobacco. He reports current alcohol use of about 10.0 standard drinks of alcohol per week. He reports that he does not use drugs.  Allergies: No Known Allergies  Medications Prior to Admission  Medication Sig Dispense Refill  . allopurinol (ZYLOPRIM) 300 MG tablet TAKE 1 TABLET BY MOUTH EVERY DAY (Patient taking differently: Take 300 mg by mouth daily. ) 90 tablet 1  . APPLE CIDER VINEGAR PO Take 2 tablets by mouth daily.    . carvedilol (COREG) 25 MG tablet TAKE 2 TABLETS BY MOUTH EVERY DAY (Patient taking differently: Take 25 mg by mouth in the morning and at bedtime. ) 60 tablet 0  . cloNIDine (CATAPRES - DOSED IN MG/24 HR) 0.2 mg/24hr patch PLACE 1 PATCH (0.2 MG TOTAL) ONTO THE SKIN ONCE A WEEK. (Patient taking differently: Place 0.2 mg onto the skin every three (3) days as needed (elevated blood pressure (lightheaded)). ) 12 patch 1  . HYDROcodone-acetaminophen (NORCO) 10-325 MG tablet Take 1 tablet by mouth 3 (three) times daily as needed (  pain.).     Marland Kitchen olmesartan (BENICAR) 40 MG tablet TAKE 1 TABLET BY MOUTH EVERY DAY (Patient taking differently: Take 40 mg by mouth daily. ) 90 tablet 0  . pantoprazole (PROTONIX) 40 MG tablet Take 1 tablet (40 mg total) by mouth daily before breakfast. 180 tablet 0  . psyllium (METAMUCIL) 58.6 % powder Take 1 packet by mouth daily as needed (constipation/regularity).    . topiramate (TOPAMAX) 25 MG tablet Take 25 mg by mouth 2 (two) times daily.    . vitamin B-12 (CYANOCOBALAMIN) 500 MCG tablet Take 500 mcg by mouth daily.    Carlena Hurl 15 MG TABS tablet  Take 15 mg by mouth daily.    Marland Kitchen eplerenone (INSPRA) 25 MG tablet Take 1 tablet (25 mg total) by mouth daily. Needs office visit for labs and refills. (Patient not taking: Reported on 12/03/2019) 90 tablet 0  . furosemide (LASIX) 40 MG tablet TAKE 1 TABLET BY MOUTH EVERY DAY AS NEEDED FOR FLUID (Patient taking differently: Take 40 mg by mouth daily as needed (fluid retention.). ) 90 tablet 0    Results for orders placed or performed during the hospital encounter of 12/21/19 (from the past 48 hour(s))  SARS CORONAVIRUS 2 (TAT 6-24 HRS) Nasopharyngeal Nasopharyngeal Swab     Status: None   Collection Time: 12/21/19  9:22 AM   Specimen: Nasopharyngeal Swab  Result Value Ref Range   SARS Coronavirus 2 NEGATIVE NEGATIVE    Comment: (NOTE) SARS-CoV-2 target nucleic acids are NOT DETECTED. The SARS-CoV-2 RNA is generally detectable in upper and lower respiratory specimens during the acute phase of infection. Negative results do not preclude SARS-CoV-2 infection, do not rule out co-infections with other pathogens, and should not be used as the sole basis for treatment or other patient management decisions. Negative results must be combined with clinical observations, patient history, and epidemiological information. The expected result is Negative. Fact Sheet for Patients: HairSlick.no Fact Sheet for Healthcare Providers: quierodirigir.com This test is not yet approved or cleared by the [place] FDA and  has been authorized for detection and/or diagnosis of SARS-CoV-2 by FDA under an Emergency Use Authorization (EUA). This EUA will remain  in effect (meaning this test can be used) for the duration of the COVID-19 declaration under Section 56 4(b)(1) of the Act, 21 U.S.C. section 360bbb-3(b)(1), unless the authorization is terminated or revoked sooner. Performed at [facility], [address] [place], [place] [address]    No  results found.  Review of Systems  All other systems reviewed and are negative.   Blood pressure (!) 148/73, pulse (!) 56, temperature 98.1 F (36.7 C), resp. rate 20, height 6\' 2"  (1.88 m), weight 64.5 kg, SpO2 99 %. Physical Exam  Patient is alert, oriented, no adenopathy, well-dressed, normal affect, normal respiratory effort. Examination patient has an antalgic gait he uses a cane he has varus alignment to the right knee with ambulation range of motion is 10 to 90 degrees of the right knee.  Radiographs of the right knee shows tricompartmental arthritis with varus alignment bone-on-bone contact medial joint line with tricompartmental spurs.  He has crepitation with range of motion of the knee collaterals and cruciates are stable. Lungs Clear. Heart RRR Assessment/Plan  1. Unilateral primary osteoarthritis, right knee     Plan: Patient states he would like to proceed with total knee arthroplasty on the right risk and benefits were discussed including infection neurovascular injury persistent pain need for additional surgery.  Patient  states he understands and wishes to proceed at this time.  Bevely Palmer Persons, PA 12/23/2019, 6:36 AM

## 2019-12-23 NOTE — Anesthesia Procedure Notes (Signed)
Procedure Name: Intubation Date/Time: 12/23/2019 7:49 AM Performed by: Shirlyn Goltz, CRNA Pre-anesthesia Checklist: Patient identified, Emergency Drugs available, Suction available and Patient being monitored Patient Re-evaluated:Patient Re-evaluated prior to induction Oxygen Delivery Method: Circle system utilized Preoxygenation: Pre-oxygenation with 100% oxygen Induction Type: IV induction Ventilation: Mask ventilation without difficulty Laryngoscope Size: Mac and 4 Grade View: Grade II Tube type: Oral Tube size: 7.5 mm Number of attempts: 1 Airway Equipment and Method: Stylet Placement Confirmation: ETT inserted through vocal cords under direct vision,  positive ETCO2 and breath sounds checked- equal and bilateral Secured at: 22 cm Tube secured with: Tape Dental Injury: Teeth and Oropharynx as per pre-operative assessment

## 2019-12-23 NOTE — Op Note (Signed)
DATE OF SURGERY:  12/23/2019  TIME: 9:56 AM  PATIENT NAME:  Leon Thomas    AGE: 64 y.o.    PRE-OPERATIVE DIAGNOSIS:  Osteoarthritis Right Knee  POST-OPERATIVE DIAGNOSIS:  Osteoarthritis Right Knee  PROCEDURE:  Procedure(s): RIGHT TOTAL KNEE ARTHROPLASTY  SURGEON: Aldean Baker  ASSISTANT: Hart Carwin  OPERATIVE IMPLANTS: Depuy , Posterior Stabilized.  Femur size 8, Tibia size 8, Patella size 41 3-peg oval button, with a 5 mm polyethylene insert.  @ENCIMAGES @    PREOPERATIVE INDICATIONS:   [name] is a 64 y.o. year old male with end stage degenerative arthritis of the knee who failed conservative treatment and elected for Total Knee Arthroplasty.   The risks, benefits, and alternatives were discussed at length including but not limited to the risks of infection, bleeding, nerve injury, stiffness, blood clots, the need for revision surgery, cardiopulmonary complications, among others, and they were willing to proceed.  OPERATIVE DESCRIPTION:  The patient was brought to the operative room and placed in a supine position.  General anesthesia was administered.  IV antibiotics were given.  The lower extremity was prepped and draped in the usual sterile fashion.  68 was used to cover all exposed skin. Time out was performed.    Anterior quadriceps tendon splitting approach was performed.  The patella was everted and osteophytes were removed.  The anterior horn of the medial and lateral meniscus was removed.   The distal femur was opened with the drill and the intramedullary distal femoral cutting jig was utilized, set at 5 degrees valgus resecting 9 mm off the distal femur.  Care was taken to protect the collateral ligaments.  Then the extramedullary tibial cutting jig was utilized set for 3 degree posterior slope.  Care was taken during the cut to protect the medial and collateral ligaments.  The proximal tibia was removed along with the posterior horns of the menisci.  The  PCL was sacrificed.    The extensor gap was measured and was approximately 5 mm.    The distal femoral sizing jig was applied, taking care to avoid notching.  Then the 4-in-1 cutting jig was applied and the anterior and posterior femur was cut, along with the chamfer cuts.  All posterior osteophytes were removed.  The flexion gap was then measured and was symmetric with the extension gap.  The distal femoral preparation using the appropriate jig to prepare the box.  The patella was then measured, and cut with the saw.    The proximal tibia sized and prepared accordingly with the reamer and the punch, and then all components were trialed with the poly insert.  The knee was found to have stable balance and full motion.  The knee was irrigated with normal saline and the knee was soaked with TXA.  The above named components were then cemented into place and all excess cement was removed.  The final polyethylene component was in place during cementation.  The knee was kept in extension until the cement hardened.  The knee was then taken through a range of motion and the patella tracked well and the knee irrigated copiously and the parapatellar and subcutaneous tissue closed with vicryl, and skin closed with staples..  A sterile dressing was applied and patient  was taken to the PACU in stable  condition.  There were no complications.  Total tourniquet time was 0 minutes.

## 2019-12-23 NOTE — Anesthesia Procedure Notes (Signed)
Anesthesia Regional Block: Adductor canal block   Pre-Anesthetic Checklist: ,, timeout performed, Correct Patient, Correct Site, Correct Laterality, Correct Procedure, Correct Position, site marked, Risks and benefits discussed,  Surgical consent,  Pre-op evaluation,  At surgeon's request and post-op pain management  Laterality: Right  Prep: chloraprep       Needles:  Injection technique: Single-shot  Needle Type: Echogenic Stimulator Needle     Needle Length: 9cm  Needle Gauge: 21   Needle insertion depth: 9 cm   Additional Needles:   Procedures:,,,, ultrasound used (permanent image in chart),,,,  Narrative:  Start time: 12/23/2019 7:04 AM End time: 12/23/2019 7:10 AM Injection made incrementally with aspirations every 5 mL.  Performed by: Personally  Anesthesiologist: Mal Amabile, MD  Additional Notes: Timeout performed. Patient sedated. Relevant anatomy ID'd using [place]. Incremental 2-52ml injection of LA with frequent aspiration. Patient tolerated procedure well.        Right Adductor Canal Block

## 2019-12-24 ENCOUNTER — Encounter: Payer: Self-pay | Admitting: *Deleted

## 2019-12-24 MED ORDER — RIVAROXABAN 15 MG PO TABS
15.0000 mg | ORAL_TABLET | Freq: Every day | ORAL | Status: DC
  Administered 2019-12-25: 15 mg via ORAL
  Filled 2019-12-24 (×2): qty 1

## 2019-12-24 NOTE — Progress Notes (Signed)
Patient ID: Leon Thomas, male   DOB: 31-Oct-1955, 64 y.o.   MRN: [id number] Patient is postoperative day 1 right total knee arthroplasty.  Patient states he has no pain a little bit of soreness in the thigh.  No drainage of the wound VAC canister there are 2 checks.  Plan for physical therapy possible discharge today or tomorrow depending on his ability to ambulate.  Discussed the importance of working on knee extension.

## 2019-12-24 NOTE — Evaluation (Signed)
Occupational Therapy Evaluation Patient Details Name: Leon Thomas MRN: [id number] DOB: 01/23/1956 Today's Date: 12/24/2019    History of Present Illness Pt is a 63 y.o. M with significant PMH of CHF, L TKA, R rotator cuff tear who presents with right knee osteoarthritis now s/p total knee arthroplasty 12/23/2019.    Clinical Impression   Pt admitted with above. He demonstrates the below listed deficits and will benefit from continued OT to maximize safety and independence with BADLs.  Pt requires min guard - mod A for ADLs and min guard assist for functional transfers.   He demonstrates difficulty accessing feet for LB ADLs, would benefit from AE instruction.  He is mildly impulsive and requires cues for safety.       Follow Up Recommendations  No OT follow up;Supervision - Intermittent    Equipment Recommendations  3 in 1 bedside commode    Recommendations for Other Services       Precautions / Restrictions Precautions Precautions: Fall;Other (comment) Precaution Comments: wound vac Restrictions Weight Bearing Restrictions: Yes RLE Weight Bearing: Weight bearing as tolerated      Mobility Bed Mobility                  Transfers Overall transfer level: Needs assistance Equipment used: Rolling walker (2 wheeled) Transfers: Sit to/from Stand;Stand Pivot Transfers Sit to Stand: Min guard Stand pivot transfers: Min guard       General transfer comment: min guard for safety     Balance Overall balance assessment: Needs assistance Sitting-balance support: Feet supported Sitting balance-Leahy Scale: Normal     Standing balance support: During functional activity;No upper extremity supported Standing balance-Leahy Scale: Fair Standing balance comment: able to maintain static standing with min guard assist                            ADL either performed or assessed with clinical judgement   ADL Overall ADL's : Needs  assistance/impaired Eating/Feeding: Independent   Grooming: Wash/dry hands;Wash/dry face;Oral care;Brushing hair;Min guard;Standing   Upper Body Bathing: Set up;Sitting   Lower Body Bathing: Moderate assistance;Sit to/from stand Lower Body Bathing Details (indicate cue type and reason): difficulty accessing feet  Upper Body Dressing : Set up;Sitting   Lower Body Dressing: Moderate assistance;Sit to/from stand Lower Body Dressing Details (indicate cue type and reason): difficulty accessing feet  Toilet Transfer: Min guard;Ambulation;Comfort height toilet;Grab bars;RW Toilet Transfer Details (indicate cue type and reason): heavy reliance on grab bar.  He does not want a 3in1 and insists he can push up on sink vanity  Toileting- Architect and Hygiene: Min guard;Sit to/from stand       Functional mobility during ADLs: Hydrographic surveyor     Praxis      Pertinent Vitals/Pain Pain Assessment: 0-10 Pain Score: 8  Faces Pain Scale: Hurts whole lot Pain Location: R knee with weightbearing Pain Descriptors / Indicators: Grimacing;Guarding Pain Intervention(s): Monitored during session     Hand Dominance     Extremity/Trunk Assessment Upper Extremity Assessment Upper Extremity Assessment: Overall WFL for tasks assessed   Lower Extremity Assessment Lower Extremity Assessment: Defer to PT evaluation RLE Deficits / Details: S/p TKA. Able to perform limited heel slide, quad set   Cervical / Trunk Assessment Cervical / Trunk Assessment: Other exceptions Cervical / Trunk Exceptions: increased body habitus   Communication Communication  Communication: No difficulties   Cognition Arousal/Alertness: Awake/alert Behavior During Therapy: WFL for tasks assessed/performed Overall Cognitive Status: Within Functional Limits for tasks assessed                                 General Comments: Pt is distractable, and a  bit impulsive    General Comments       Exercises Total Joint Exercises Heel Slides: Right;Supine;15 reps;Seated Hip ABduction/ADduction: Right;10 reps;Supine   Shoulder Instructions      Home Living Family/patient expects to be discharged to:: Private residence Living Arrangements: Spouse/significant other(fiance) Available Help at Discharge: Other (Comment)(fiance) Type of Home: Apartment Home Access: Stairs to enter Entrance Stairs-Number of Steps: 5(low steps) Entrance Stairs-Rails: Right Home Layout: One level     Bathroom Shower/Tub: Chief Strategy Officer: Handicapped height     Home Equipment: Gilmer Mor - single point   Additional Comments: Steffanie Rainwater' works during the day.  He is Buying a house soon, but discharging to an apartment. Info for apt listed above      Prior Functioning/Environment Level of Independence: Independent with assistive device(s)        Comments: Uses cane, retired Pension scheme manager        OT Problem List: Decreased activity tolerance;Impaired balance (sitting and/or standing);Decreased safety awareness;Decreased knowledge of use of DME or AE;Decreased knowledge of precautions;Pain;Obesity      OT Treatment/Interventions: Self-care/ADL training;DME and/or AE instruction;Therapeutic activities;Patient/family education;Balance training    OT Goals(Current goals can be found in the care plan section) Acute Rehab OT Goals Patient Stated Goal: to be able to don/doff socks and shoes  OT Goal Formulation: With patient Time For Goal Achievement: 12/31/19 Potential to Achieve Goals: Good ADL Goals Pt Will Perform Grooming: with supervision;standing Pt Will Perform Lower Body Bathing: with supervision;with adaptive equipment;sit to/from stand Pt Will Perform Lower Body Dressing: with supervision;with adaptive equipment;sit to/from stand Pt Will Transfer to Toilet: with supervision;ambulating;regular height toilet;bedside  commode;grab bars Pt Will Perform Toileting - Clothing Manipulation and hygiene: with supervision;sit to/from stand Pt Will Perform Tub/Shower Transfer: Tub transfer;with min guard assist;ambulating;shower seat;rolling walker  OT Frequency: Min 2X/week   Barriers to D/C: Decreased caregiver support          Co-evaluation              AM-PAC OT "6 Clicks" Daily Activity     Outcome Measure Help from another person eating meals?: None Help from another person taking care of personal grooming?: A Little Help from another person toileting, which includes using toliet, bedpan, or urinal?: A Little Help from another person bathing (including washing, rinsing, drying)?: A Little Help from another person to put on and taking off regular upper body clothing?: A Little Help from another person to put on and taking off regular lower body clothing?: A Lot 6 Click Score: 18   End of Session Equipment Utilized During Treatment: Rolling walker;Gait belt Nurse Communication: Mobility status  Activity Tolerance: Patient tolerated treatment well Patient left: in chair;with call bell/phone within reach;with chair alarm set  OT Visit Diagnosis: Unsteadiness on feet (R26.81);Pain Pain - Right/Left: Right Pain - part of body: Knee                Time: 1601-0932 OT Time Calculation (min): 25 min Charges:  OT General Charges $OT Visit: 1 Visit OT Evaluation $OT Eval Moderate Complexity: 1 Mod OT Treatments $Self Care/Home  Management : 8-22 mins  Nilsa Nutting., OTR/L Acute Rehabilitation Services Pager [phone] Office [place], [place] 12/24/2019, 3:33 PM

## 2019-12-24 NOTE — Evaluation (Signed)
Physical Therapy Evaluation Patient Details Name: Leon Thomas MRN: [id number] DOB: 17-Feb-1956 Today's Date: 12/24/2019   History of Present Illness  Pt is a 64 y.o. M with significant PMH of CHF, L TKA, R rotator cuff tear who presents with right knee osteoarthritis now s/p total knee arthroplasty 12/23/2019.   Clinical Impression  Pt evaluated s/p R TKA. Presents with decreased functional mobility secondary to RLE weakness, decreased ROM, right knee pain, balance impairments, and obesity. Ambulating 30 feet with a walker at a min guard assist pattern. Displays antalgic gait pattern and heavy reliance through arms on walker. Achieving 96 degrees seated knee flexion. Education initiated regarding HEP, knee positioning, activity progression. Pt will need continued gait and stair training prior to discharge (has 5 steps to enter).    Follow Up Recommendations Home health PT;Supervision for mobility/OOB    Equipment Recommendations  Rolling walker with 5" wheels;3in1 (PT)(bariatric)    Recommendations for Other Services       Precautions / Restrictions Precautions Precautions: Fall;Other (comment) Precaution Comments: wound vac Restrictions Weight Bearing Restrictions: Yes RLE Weight Bearing: Weight bearing as tolerated      Mobility  Bed Mobility Overal bed mobility: Needs Assistance Bed Mobility: Supine to Sit     Supine to sit: Supervision     General bed mobility comments: Increased time/effort. No physical assist required. HOB elevated.  Transfers Overall transfer level: Needs assistance Equipment used: Rolling walker (2 wheeled) Transfers: Sit to/from Stand Sit to Stand: Min guard         General transfer comment: Min guard to rise from significantly elevated bed  Ambulation/Gait Ambulation/Gait assistance: Min guard Gait Distance (Feet): 30 Feet Assistive device: Rolling walker (2 wheeled) Gait Pattern/deviations: Step-through pattern;Decreased stance time -  right;Decreased weight shift to right;Decreased stride length;Antalgic Gait velocity: decreased Gait velocity interpretation: <1.31 ft/sec, indicative of household ambulator General Gait Details: Slow, effortful gait with heavy reliance through arms on walker. Cues for increased left foot clearance, rolling walker rather than picking it up  Stairs            Wheelchair Mobility    Modified Rankin (Stroke Patients Only)       Balance Overall balance assessment: Needs assistance Sitting-balance support: Feet supported Sitting balance-Leahy Scale: Normal     Standing balance support: Bilateral upper extremity supported Standing balance-Leahy Scale: Poor                               Pertinent Vitals/Pain Pain Assessment: Faces Faces Pain Scale: Hurts even more Pain Location: R knee with weightbearing Pain Descriptors / Indicators: Grimacing;Guarding Pain Intervention(s): Limited activity within patient's tolerance;Monitored during session;Patient requesting pain meds-RN notified    Home Living Family/patient expects to be discharged to:: Private residence Living Arrangements: Spouse/significant other(fiance) Available Help at Discharge: Other (Comment)(fiance) Type of Home: Apartment Home Access: Stairs to enter Entrance Stairs-Rails: Right Entrance Stairs-Number of Steps: 5(low steps) Home Layout: One level Home Equipment: Cane - single point Additional Comments: Buying a house soon, but discharging to an apartment. Info for apt listed above    Prior Function Level of Independence: Independent with assistive device(s)         Comments: Uses cane, retired Programmer, systems        Extremity/Trunk Assessment   Upper Extremity Assessment Upper Extremity Assessment: Defer to OT evaluation    Lower Extremity Assessment Lower Extremity Assessment:  RLE deficits/detail RLE Deficits / Details: S/p TKA. Able to  perform limited heel slide, quad set    Cervical / Trunk Assessment Cervical / Trunk Assessment: Other exceptions Cervical / Trunk Exceptions: increased body habitus  Communication   Communication: No difficulties  Cognition Arousal/Alertness: Awake/alert Behavior During Therapy: WFL for tasks assessed/performed Overall Cognitive Status: Within Functional Limits for tasks assessed                                 General Comments: He can be distractable and tangential, likely baseline      General Comments      Exercises Total Joint Exercises Ankle Circles/Pumps: Right;5 reps;Supine Quad Sets: Right;10 reps;Supine Heel Slides: Right;Supine;15 reps;Seated Hip ABduction/ADduction: Right;10 reps;Supine Goniometric ROM: Seated: 96 degrees knee flexion   Assessment/Plan    PT Assessment Patient needs continued PT services  PT Problem List Decreased strength;Decreased range of motion;Decreased activity tolerance;Decreased balance;Decreased mobility;Obesity;Pain       PT Treatment Interventions DME instruction;Gait training;Stair training;Functional mobility training;Therapeutic activities;Therapeutic exercise;Balance training;Patient/family education    PT Goals (Current goals can be found in the Care Plan section)  Acute Rehab PT Goals Patient Stated Goal: get out of bed, walk, not get an infection PT Goal Formulation: With patient Time For Goal Achievement: 01/07/20 Potential to Achieve Goals: Good    Frequency 7X/week   Barriers to discharge        Co-evaluation               AM-PAC PT "6 Clicks" Mobility  Outcome Measure Help needed turning from your back to your side while in a flat bed without using bedrails?: None Help needed moving from lying on your back to sitting on the side of a flat bed without using bedrails?: None Help needed moving to and from a bed to a chair (including a wheelchair)?: A Little Help needed standing up from a chair  using your arms (e.g., wheelchair or bedside chair)?: A Little Help needed to walk in hospital room?: A Little Help needed climbing 3-5 steps with a railing? : A Lot 6 Click Score: 19    End of Session   Activity Tolerance: Patient tolerated treatment well Patient left: in chair;with call bell/phone within reach;with chair alarm set Nurse Communication: Mobility status PT Visit Diagnosis: Pain;Difficulty in walking, not elsewhere classified (R26.2) Pain - Right/Left: Right Pain - part of body: Knee    Time: 0832-0912 PT Time Calculation (min) (ACUTE ONLY): 40 min   Charges:   PT Evaluation $PT Eval Moderate Complexity: 1 Mod PT Treatments $Gait Training: 8-22 mins $Therapeutic Exercise: 8-22 mins          Wyona Almas, PT, DPT Acute Rehabilitation Services Pager [phone] Office [phone]   Deno Etienne 12/24/2019, 10:21 AM

## 2019-12-24 NOTE — Progress Notes (Signed)
Physical Therapy Treatment Patient Details Name: Leon Thomas MRN: [id number] DOB: 03/13/1956 Today's Date: 12/24/2019    History of Present Illness Pt is a 64 y.o. M with significant PMH of CHF, L TKA, R rotator cuff tear who presents with right knee osteoarthritis now s/p total knee arthroplasty 12/23/2019.     PT Comments    Patient is progressing very well towards their physical therapy goals. Session focused on continued gait training, progression of functional mobility, and therapeutic exercises. Pt with noted improved activity tolerance and ambulation distance this session. Ambulating 160 feet with a walker at a min guard assist level. Will trial steps tomorrow morning to prepare for discharge home.     Follow Up Recommendations  Home health PT;Supervision for mobility/OOB     Equipment Recommendations  Rolling walker with 5" wheels;3in1 (PT)(bariatric)    Recommendations for Other Services       Precautions / Restrictions Precautions Precautions: Fall;Other (comment) Precaution Comments: wound vac Restrictions Weight Bearing Restrictions: Yes RLE Weight Bearing: Weight bearing as tolerated    Mobility  Bed Mobility               General bed mobility comments: OOB in chair  Transfers Overall transfer level: Needs assistance Equipment used: Rolling walker (2 wheeled) Transfers: Sit to/from Stand Sit to Stand: Min guard Stand pivot transfers: Min guard       General transfer comment: Min guard to rise from recliner  Ambulation/Gait Ambulation/Gait assistance: Min guard Gait Distance (Feet): 160 Feet Assistive device: Rolling walker (2 wheeled) Gait Pattern/deviations: Step-through pattern;Decreased stance time - right;Decreased weight shift to right;Decreased stride length;Antalgic;Step-to pattern Gait velocity: decreased   General Gait Details: Able to progress to step through pattern, improved L foot clearance. Cues for neutral foot positioning,  keeping feet on inside of walker during turns    Stairs             Wheelchair Mobility    Modified Rankin (Stroke Patients Only)       Balance Overall balance assessment: Needs assistance Sitting-balance support: Feet supported Sitting balance-Leahy Scale: Normal     Standing balance support: Bilateral upper extremity supported Standing balance-Leahy Scale: Poor Standing balance comment: able to maintain static standing with min guard assist                             Cognition Arousal/Alertness: Awake/alert Behavior During Therapy: WFL for tasks assessed/performed Overall Cognitive Status: Within Functional Limits for tasks assessed                                 General Comments: He can be distractable and tangential, likely baseline      Exercises Total Joint Exercises Heel Slides: Right;Supine;15 reps;Seated Hip ABduction/ADduction: 10 reps;Both;Seated Long Arc Quad: Right;10 reps;Seated    General Comments        Pertinent Vitals/Pain Pain Assessment: Faces Pain Score: 8  Faces Pain Scale: Hurts even more Pain Location: R knee with weightbearing Pain Descriptors / Indicators: Grimacing;Guarding Pain Intervention(s): Monitored during session    Home Living Family/patient expects to be discharged to:: Private residence Living Arrangements: Spouse/significant other(fiance) Available Help at Discharge: Other (Comment)(fiance) Type of Home: Apartment Home Access: Stairs to enter Entrance Stairs-Rails: Right Home Layout: One level Home Equipment: Gilmer Mor - single point Additional Comments: Leon Thomas' works during the day.  He is Buying a house soon,  but discharging to an apartment. Info for apt listed above    Prior Function Level of Independence: Independent with assistive device(s)      Comments: Uses cane, retired Aeronautical engineer   PT Goals (current goals can now be found in the care plan section) Acute  Rehab PT Goals Patient Stated Goal: get out of bed, walk, not get an infection PT Goal Formulation: With patient Time For Goal Achievement: 01/07/20 Potential to Achieve Goals: Good Progress towards PT goals: Progressing toward goals    Frequency    7X/week      PT Plan Current plan remains appropriate    Co-evaluation              AM-PAC PT "6 Clicks" Mobility   Outcome Measure  Help needed turning from your back to your side while in a flat bed without using bedrails?: None Help needed moving from lying on your back to sitting on the side of a flat bed without using bedrails?: None Help needed moving to and from a bed to a chair (including a wheelchair)?: A Little Help needed standing up from a chair using your arms (e.g., wheelchair or bedside chair)?: A Little Help needed to walk in hospital room?: A Little Help needed climbing 3-5 steps with a railing? : A Lot 6 Click Score: 19    End of Session   Activity Tolerance: Patient tolerated treatment well Patient left: in chair;with call bell/phone within reach;with chair alarm set Nurse Communication: Mobility status PT Visit Diagnosis: Pain;Difficulty in walking, not elsewhere classified (R26.2) Pain - Right/Left: Right Pain - part of body: Knee     Time: 4765-4650 PT Time Calculation (min) (ACUTE ONLY): 21 min  Charges:  $Gait Training: 8-22 mins                       Wyona Almas, PT, DPT [place] Pager [phone] Office [phone]    Deno Etienne 12/24/2019, 5:26 PM

## 2019-12-24 NOTE — Care Management Obs Status (Signed)
MEDICARE OBSERVATION STATUS NOTIFICATION   Patient Details  Name: MUNACHIMSO PALIN MRN: [id number] Date of Birth: Jul 06, 1956   Medicare Observation Status Notification Given:  Yes    Janae Bridgeman, RN 12/24/2019, 3:25 PM

## 2019-12-24 NOTE — TOC Initial Note (Signed)
Transition of Care [facility]) - Initial/Assessment Note    Patient Details  Name: Leon Thomas MRN: [id number] Date of Birth: [birth year]  [facility]) CM/SW Contact:    Curlene Labrum, RN Phone Number: 12/24/2019, 3:41 PM  Clinical Narrative:                 Case management met with the patient regarding transitions of care.  Patient lives with his girlfriend, who works at [employer] in supply.  Patient was given Medicare Choice by Dr. Jess Barters office for [facility] choice  - [facility] for PT was set up prior to surgery.  Patient was given choice regarding dme and [employer] called to order RW and 3:1 for home.  Patient currently has a wound vac to his left knee. - will following for nursing home needs if any.  Expected Discharge Plan: [place]     Patient Goals and CMS Choice Patient states their goals for this hospitalization and ongoing recovery are:: I've had my other knee fixed so I'm ready to get better and go home. CMS Medicare.gov Compare Post Acute Care list provided to:: Patient Choice offered to / list presented to : Patient  Expected Discharge Plan and Services Expected Discharge Plan: [place]   Discharge Planning Services: CM Consult Post Acute Care Choice: Durable Medical Equipment Living arrangements for the past 2 months: Apartment                 DME Arranged: 3-N-1, Walker rolling DME Agency: [employer] Date DME Agency Contacted: 12/24/19 Time DME Agency Contacted: [id number] Representative spoke with at DME Agency: [place]: PT [place]: [facility] [employer] Date [place]: 12/24/19 Time [place]: 59 Representative spoke with at [place]: Jonelle Sidle, RNCM  Prior Living Arrangements/Services Living arrangements for the past 2 months: [place] with:: Significant Other Patient language and need for interpreter reviewed:: Yes Do you feel safe going  back to the place where you live?: Yes      Need for Family Participation in Patient Care: Yes (Comment) Care giver support system in place?: Yes (comment)   Criminal Activity/Legal Involvement Pertinent to Current Situation/Hospitalization: No - Comment as needed  Activities of Daily Living Home Assistive Devices/Equipment: Cane (specify quad or straight), Eyeglasses ADL Screening (condition at time of admission) Patient's cognitive ability adequate to safely complete daily activities?: Yes Is the patient deaf or have difficulty hearing?: No Does the patient have difficulty seeing, even when wearing glasses/contacts?: No Does the patient have difficulty concentrating, remembering, or making decisions?: No Patient able to express need for assistance with ADLs?: Yes Does the patient have difficulty dressing or bathing?: No Independently performs ADLs?: Yes (appropriate for developmental age) Does the patient have difficulty walking or climbing stairs?: Yes Weakness of Legs: Right Weakness of Arms/Hands: None  Permission Sought/Granted Permission sought to share information with : Case Manager Permission granted to share information with : Yes, Verbal Permission Granted     Permission granted to share info w AGENCY: [facility] [place] granted to share info w Relationship: Sheryl, friend     Emotional Assessment Appearance:: Appears stated age Attitude/Demeanor/Rapport: Gracious Affect (typically observed): Accepting Orientation: : Oriented to Self, Oriented to Place, Oriented to  Time, Oriented to Situation Alcohol / Substance Use: Not Applicable Psych Involvement: No (comment)  Admission diagnosis:  Arthritis of right knee [M17.11] Patient Active Problem List  Diagnosis Date Noted  . Arthritis of right knee 12/23/2019  . Chronic gastritis 01/28/2018  . GI bleed 01/07/2018  . Unilateral primary osteoarthritis, right knee 12/12/2017  . Chronic prescription  opiate use 12/12/2017  . Chronic pain syndrome 12/12/2017  . Atrial fibrillation ([place]) 05/13/2017  . Gout, arthritis 05/13/2017  . Osteoarthritis of left knee 05/13/2017  . Morbid obesity ([place]) 05/13/2017  . Chronic anticoagulation 05/13/2017  . History of knee joint replacement 05/13/2017  . Chronic use of opiate drugs therapeutic purposes 05/13/2017  . Anosmia 06/20/2016  . OSA (obstructive sleep apnea) 06/20/2016   PCP:  Forrest Moron, MD Pharmacy:   [id number] [address]: [phone]: [phone]    Social Determinants of Health (SDOH) Interventions    Readmission Risk Interventions No flowsheet data found.

## 2019-12-25 ENCOUNTER — Telehealth: Payer: Self-pay | Admitting: Physician Assistant

## 2019-12-25 DIAGNOSIS — Z7901 Long term (current) use of anticoagulants: Secondary | ICD-10-CM | POA: Diagnosis not present

## 2019-12-25 DIAGNOSIS — Z6841 Body Mass Index (BMI) 40.0 and over, adult: Secondary | ICD-10-CM | POA: Diagnosis not present

## 2019-12-25 DIAGNOSIS — K219 Gastro-esophageal reflux disease without esophagitis: Secondary | ICD-10-CM | POA: Diagnosis present

## 2019-12-25 DIAGNOSIS — I509 Heart failure, unspecified: Secondary | ICD-10-CM | POA: Diagnosis present

## 2019-12-25 DIAGNOSIS — Z96652 Presence of left artificial knee joint: Secondary | ICD-10-CM | POA: Diagnosis present

## 2019-12-25 DIAGNOSIS — Z20822 Contact with and (suspected) exposure to covid-19: Secondary | ICD-10-CM | POA: Diagnosis present

## 2019-12-25 DIAGNOSIS — M1711 Unilateral primary osteoarthritis, right knee: Secondary | ICD-10-CM | POA: Diagnosis present

## 2019-12-25 DIAGNOSIS — I4891 Unspecified atrial fibrillation: Secondary | ICD-10-CM | POA: Diagnosis present

## 2019-12-25 DIAGNOSIS — I110 Hypertensive heart disease with heart failure: Secondary | ICD-10-CM | POA: Diagnosis present

## 2019-12-25 MED ORDER — OXYCODONE HCL 5 MG PO TABS: 5.0000 mg | ORAL_TABLET | ORAL | 0 refills | Status: AC | PRN

## 2019-12-25 NOTE — Telephone Encounter (Signed)
Chelsea from [facility] on [address] called requesting a call back about prescription. Chelsea stated patient is on oxycodone and PA Person's scripted medication of hydrocodone. She wanted clearance to fill hydrocodone. Please call Chelsea at [facility] [phone].

## 2019-12-25 NOTE — Telephone Encounter (Signed)
I called and lm on vm to advise of message below. To call with any questions.  

## 2019-12-25 NOTE — Telephone Encounter (Signed)
Pt is s/p a total knee Wednesday please see below.

## 2019-12-25 NOTE — Progress Notes (Signed)
Occupational Therapy Treatment Patient Details Name: Leon Thomas MRN: [id number] DOB: 1956-07-25 Today's Date: 12/25/2019    History of present illness Pt is a 64 y.o. M with significant PMH of CHF, L TKA, R rotator cuff tear who presents with right knee osteoarthritis now s/p total knee arthroplasty 12/23/2019.    OT comments  Pt received seated in recliner reporting increased pain in RLE from previous PT session. Pt declined transfers or functional mobility but agreeable to educational session regarding LB AE and demo of tub transfer to 3n1. Pt required MOD A for LB dressing with sock aid. Pt reports using reacher at home and familiar with LB dressing technique with reacher. Demo'ed use of 3n1 in tub shower serving as TTB with pt verbalizing understanding. Education provided on LB dressing with wound vac and full education on all LB AE with pt only interested in sock aid and LH sponge. Agree with DC plan below, will follow acutely for OT needs.   Follow Up Recommendations  No OT follow up;Supervision - Intermittent    Equipment Recommendations  3 in 1 bedside commode    Recommendations for Other Services      Precautions / Restrictions Precautions Precautions: Fall;Other (comment) Precaution Comments: wound vac Restrictions Weight Bearing Restrictions: Yes RLE Weight Bearing: Weight bearing as tolerated       Mobility Bed Mobility               General bed mobility comments: OOB in chair  Transfers Overall transfer level: Needs assistance Equipment used: Rolling walker (2 wheeled) Transfers: Sit to/from Stand Sit to Stand: Min assist         General transfer comment: declined transfer from chair d/t pain; visual demo'ed pivot transfer to 3n1 in tub shower    Balance Overall balance assessment: Needs assistance   Sitting balance-Leahy Scale: Normal       Standing balance-Leahy Scale: Poor Standing balance comment: able to maintain static standing with min  guard assist                            ADL either performed or assessed with clinical judgement   ADL Overall ADL's : Needs assistance/impaired               Lower Body Bathing Details (indicate cue type and reason): issued pt LH sponge to assist with LB bathing     Lower Body Dressing: Moderate assistance;With adaptive equipment;Sitting/lateral leans Lower Body Dressing Details (indicate cue type and reason): MOD A to don sock with sockaid; cues to sequence novel task   Toilet Transfer Details (indicate cue type and reason): pt declined transfer training d/t pain from previous PT session     Tub/ Shower Transfer: Tub transfer;3 in 1;Grab bars Tub/Shower Transfer Details (indicate cue type and reason): demo of tub transfer to 3n1 with pt verbalizing understanding   General ADL Comments: pt limited by pain this session declining out of chair mobility; session focus on LB AE education and shower transfer training to 3n1     Vision       Perception     Praxis      Cognition Arousal/Alertness: Awake/alert Behavior During Therapy: WFL for tasks assessed/performed Overall Cognitive Status: Within Functional Limits for tasks assessed  General Comments: He can be distractable and tangential, likely baseline        Exercises   Shoulder Instructions       General Comments issued pt bari sock aid and LH sponge for LB bathing    Pertinent Vitals/ Pain       Pain Assessment: 0-10 Pain Score: 9  Pain Location: R knee when lifiting to place towel underneath leg Pain Descriptors / Indicators: Discomfort;Moaning;Grimacing Pain Intervention(s): Monitored during session;Repositioned  Home Living                                          Prior Functioning/Environment              Frequency  Min 2X/week        Progress Toward Goals  OT Goals(current goals can now be found in the care  plan section)  Progress towards OT goals: Progressing toward goals  Acute Rehab OT Goals Patient Stated Goal: Climb the stairs OT Goal Formulation: With patient Time For Goal Achievement: 12/31/19 Potential to Achieve Goals: Good  Plan Discharge plan remains appropriate    Co-evaluation                 AM-PAC OT "6 Clicks" Daily Activity     Outcome Measure   Help from another person eating meals?: None Help from another person taking care of personal grooming?: A Little Help from another person toileting, which includes using toliet, bedpan, or urinal?: A Little Help from another person bathing (including washing, rinsing, drying)?: A Little Help from another person to put on and taking off regular upper body clothing?: A Little Help from another person to put on and taking off regular lower body clothing?: A Lot 6 Click Score: 18    End of Session Equipment Utilized During Treatment: Other (comment)(LB AE)  OT Visit Diagnosis: Unsteadiness on feet (R26.81);Pain Pain - Right/Left: Right Pain - part of body: Knee   Activity Tolerance Patient tolerated treatment well   Patient Left in chair;with call bell/phone within reach   Nurse Communication          Time: 6226-3335 OT Time Calculation (min): 13 min  Charges: OT General Charges $OT Visit: 1 Visit OT Treatments $Self Care/Home Management : 8-22 mins  Lanier Clam., COTA/L Acute Rehabilitation Services [phone] [place] 12/25/2019, 2:20 PM

## 2019-12-25 NOTE — Progress Notes (Signed)
Patient discharging home. Discharge instructions explained to patient and he verbalized understanding. Wound vac switched to prevena, with good suction, extra canister given to patient. Took all personal belongings. No further questions or concerns voiced.

## 2019-12-25 NOTE — Progress Notes (Addendum)
Physical Therapy Treatment Patient Details Name: Leon Thomas MRN: [id number] DOB: Jul 24, 1956 Today's Date: 12/25/2019    History of Present Illness Pt is a 64 y.o. M with significant PMH of CHF, L TKA, R rotator cuff tear who presents with right knee osteoarthritis now s/p total knee arthroplasty 12/23/2019.     PT Comments    Pt reclined in chair on arrival.  Required moderate cues for encouragement to participate in PT session this pm.  Focused on gait training and reviewing HEP for home use.  HEP issued and he was educated on frequency.  PTA adjusted new RW to his height.  Continue to recommend HHPT.  Pt is ready to d/c home from a mobility stand point and understands he will need significant assistance to enter home due to stairs.  He denies need for ambulance transport as he is worried about cost.     Follow Up Recommendations  Home health PT;Supervision for mobility/OOB     Equipment Recommendations  Rolling walker with 5" wheels;3in1 (PT)    Recommendations for Other Services       Precautions / Restrictions Precautions Precautions: Fall;Other (comment) Precaution Comments: wound vac Restrictions Weight Bearing Restrictions: Yes RLE Weight Bearing: Weight bearing as tolerated    Mobility  Bed Mobility               General bed mobility comments: OOB in chair  Transfers Overall transfer level: Needs assistance Equipment used: Rolling walker (2 wheeled) Transfers: Sit to/from Stand Sit to Stand: Min assist         General transfer comment: Pt required assistance to move from chair to standing.  He continues to be slow with movement and presents with poor eccentric load back to recliner.  Better carryover following commands for hand placement but continues to flop.  Ambulation/Gait Ambulation/Gait assistance: Min guard Gait Distance (Feet): 80 Feet Assistive device: Rolling walker (2 wheeled) Gait Pattern/deviations: Decreased stance time - right;Decreased  weight shift to right;Decreased stride length;Antalgic;Step-to pattern;Wide base of support Gait velocity: decreased   General Gait Details: Cues for upper trunk control, adjusted new personal RW for correct fit in height.  He continues require several rest breaks due to fatigue.  Pt has very poor endurance.   Stairs        Wheelchair Mobility    Modified Rankin (Stroke Patients Only)       Balance Overall balance assessment: Needs assistance Sitting-balance support: Feet supported Sitting balance-Leahy Scale: Normal       Standing balance-Leahy Scale: Poor Standing balance comment: able to maintain static standing with min guard assist                             Cognition Arousal/Alertness: Awake/alert Behavior During Therapy: WFL for tasks assessed/performed Overall Cognitive Status: Within Functional Limits for tasks assessed                                 General Comments: He can be distractable and tangential, likely baseline      Exercises Total Joint Exercises Ankle Circles/Pumps: AROM;Both;20 reps;Supine Quad Sets: AROM;Right;10 reps;Supine Heel Slides: AAROM;Right;10 reps;Supine Hip ABduction/ADduction: AAROM;Right;10 reps;Supine Straight Leg Raises: AAROM;Right;10 reps;Supine    General Comments General comments (skin integrity, edema, etc.): issued pt bari sock aid and LH sponge for LB bathing      Pertinent Vitals/Pain Pain Assessment: 0-10 Pain Score:  9  Faces Pain Scale: Hurts even more Pain Location: R knee when lifiting to place towel underneath leg Pain Descriptors / Indicators: Discomfort;Moaning;Grimacing Pain Intervention(s): Monitored during session;Repositioned;Ice applied    Home Living                      Prior Function            PT Goals (current goals can now be found in the care plan section) Acute Rehab PT Goals Patient Stated Goal: Climb the stairs Potential to Achieve Goals:  Good Progress towards PT goals: Progressing toward goals    Frequency    7X/week      PT Plan Current plan remains appropriate    Co-evaluation              AM-PAC PT "6 Clicks" Mobility   Outcome Measure  Help needed turning from your back to your side while in a flat bed without using bedrails?: None Help needed moving from lying on your back to sitting on the side of a flat bed without using bedrails?: None Help needed moving to and from a bed to a chair (including a wheelchair)?: A Little Help needed standing up from a chair using your arms (e.g., wheelchair or bedside chair)?: A Little Help needed to walk in hospital room?: A Little Help needed climbing 3-5 steps with a railing? : A Little 6 Click Score: 20    End of Session Equipment Utilized During Treatment: Gait belt Activity Tolerance: Patient limited by fatigue Patient left: in chair;with call bell/phone within reach;with chair alarm set Nurse Communication: Mobility status PT Visit Diagnosis: Pain;Difficulty in walking, not elsewhere classified (R26.2) Pain - Right/Left: Right Pain - part of body: Knee     Time: 0623-7628 PT Time Calculation (min) (ACUTE ONLY): 27 min  Charges:  $Gait Training: 8-22 mins $Therapeutic Exercise: 8-22 mins                     Erasmo Leventhal , PTA Acute Rehabilitation Services Pager [phone] Office [phone]     Marrianne Sica Eli Hose 12/25/2019, 3:15 PM

## 2019-12-25 NOTE — Progress Notes (Signed)
PT Cancellation Note  Patient Details Name: Leon Thomas MRN: [id number] DOB: Apr 17, 1956   Cancelled Treatment:    Reason Eval/Treat Not Completed: (P) Patient declined, no reason specified(Pt reports needing to sleep this am, informed patient we would return in an hour for am PT session.  He is agreeable.)   Florestine Avers 12/25/2019, 9:38 AM

## 2019-12-25 NOTE — Progress Notes (Signed)
Patient is postop day 2 status post right total knee arthroplasty.  He denies fever, chills, or calf pain.  He did do quite a bit of physical therapy yesterday.  He will ports this morning that his top of his thigh is quite sore.  He thinks it may be because he "overdid it "  Vital signs stable afebrile wound VAC is functioning.  Approximately 75 cc in the canister.  Compartments are soft and compressible.  Good active flexion of his ankle.  Negative Homans' sign.  Plan he will work with physical therapy.  Plan is for discharge later today.  He does state that his Eliquis dose was recently changed by primary care.  Apparently while the pharmacist spoke with him and told him that he needed to be on a higher dose as he originally was.  I asked that he discuss this with his primary care provider.

## 2019-12-25 NOTE — Telephone Encounter (Signed)
Patient just had knee replacement and needs oxycodone which he has been taking in the hospital

## 2019-12-25 NOTE — Discharge Summary (Signed)
Discharge Diagnoses:  Active Problems:   Unilateral primary osteoarthritis, right knee   Arthritis of right knee   Surgeries: Procedure(s): RIGHT TOTAL KNEE ARTHROPLASTY on 12/23/2019    Consultants:   Discharged Condition: Improved  Hospital Course: Leon Thomas is an 64 y.o. male who was admitted 12/23/2019 with a chief complaint of Right Knee arthritis, with a final diagnosis of Osteoarthritis Right Knee.  Patient was brought to the operating room on 12/23/2019 and underwent Procedure(s): RIGHT TOTAL KNEE ARTHROPLASTY.    Patient was given perioperative antibiotics:  Anti-infectives (From admission, onward)   Start     Dose/Rate Route Frequency Ordered Stop   12/23/19 1048  ceFAZolin (ANCEF) 2-4 GM/100ML-% IVPB    Note to Pharmacy: Vilinda Blanks   : cabinet override      12/23/19 1048 12/23/19 2259   12/23/19 1045  ceFAZolin (ANCEF) IVPB 2g/100 mL premix     2 g 200 mL/hr over 30 Minutes Intravenous Every 6 hours 12/23/19 1037 12/23/19 1912   12/23/19 0600  ceFAZolin (ANCEF) 3 g in dextrose 5 % 50 mL IVPB     3 g 100 mL/hr over 30 Minutes Intravenous On call to O.R. 12/22/19 4742 12/23/19 5956    .  Patient was given sequential compression devices, early ambulation, and aspirin for DVT prophylaxis.  Recent vital signs:  Patient Vitals for the past 24 hrs:  BP Temp Temp src Pulse Resp SpO2  12/25/19 0338 137/86 98.5 F (36.9 C) Oral 63 16 98 %  12/24/19 2001 127/64 98.3 F (36.8 C) Oral 61 17 96 %  12/24/19 1424 127/68 98.4 F (36.9 C) Oral (!) 58 15 96 %  12/24/19 0756 128/61 98.5 F (36.9 C) Oral 60 17 98 %  .  Recent laboratory studies: No results found.  Discharge Medications:   Allergies as of 12/25/2019   No Known Allergies     Medication List    STOP taking these medications   HYDROcodone-acetaminophen 10-325 MG tablet Commonly known as: NORCO     TAKE these medications   allopurinol 300 MG tablet Commonly known as: ZYLOPRIM TAKE 1 TABLET BY  MOUTH EVERY DAY   APPLE CIDER VINEGAR PO Take 2 tablets by mouth daily.   carvedilol 25 MG tablet Commonly known as: COREG TAKE 2 TABLETS BY MOUTH EVERY DAY What changed:   how much to take  when to take this   cloNIDine 0.2 mg/24hr patch Commonly known as: CATAPRES - Dosed in mg/24 hr PLACE 1 PATCH (0.2 MG TOTAL) ONTO THE SKIN ONCE A WEEK. What changed:   when to take this  reasons to take this   eplerenone 25 MG tablet Commonly known as: INSPRA Take 1 tablet (25 mg total) by mouth daily. Needs office visit for labs and refills.   furosemide 40 MG tablet Commonly known as: LASIX TAKE 1 TABLET BY MOUTH EVERY DAY AS NEEDED FOR FLUID What changed: See the new instructions.   olmesartan 40 MG tablet Commonly known as: BENICAR TAKE 1 TABLET BY MOUTH EVERY DAY   oxyCODONE 5 MG immediate release tablet Commonly known as: Oxy IR/ROXICODONE Take 1-2 tablets (5-10 mg total) by mouth every 4 (four) hours as needed for moderate pain (pain score 4-6).   pantoprazole 40 MG tablet Commonly known as: PROTONIX Take 1 tablet (40 mg total) by mouth daily before breakfast.   psyllium 58.6 % powder Commonly known as: METAMUCIL Take 1 packet by mouth daily as needed (constipation/regularity).   topiramate 25 MG  tablet Commonly known as: TOPAMAX Take 25 mg by mouth 2 (two) times daily.   vitamin B-12 500 MCG tablet Commonly known as: CYANOCOBALAMIN Take 500 mcg by mouth daily.   Xarelto 15 MG Tabs tablet Generic drug: Rivaroxaban Take 15 mg by mouth daily.            Durable Medical Equipment  (From admission, onward)         Start     Ordered   12/24/19 1545  For home use only DME 3 n 1  Once    Comments: Need bariatric size   12/24/19 1548   12/24/19 1545  For home use only DME Walker rolling  Once    Question Answer Comment  Walker: With 5 Inch Wheels   Patient needs a walker to treat with the following condition Hx of total knee arthroplasty      12/24/19  1548          Diagnostic Studies: No results found.  Patient benefited maximally from their hospital stay and there were no complications.     Disposition: Discharge disposition: 01-Home or Self Care      Discharge Instructions    Call MD / Call 911   Complete by: As directed    If you experience chest pain or shortness of breath, CALL 911 and be transported to the hospital emergency room.  If you develope a fever above 101 F, pus (white drainage) or increased drainage or redness at the wound, or calf pain, call your surgeon's office.   Constipation Prevention   Complete by: As directed    Drink plenty of fluids.  Prune juice may be helpful.  You may use a stool softener, such as Colace (over the counter) 100 mg twice a day.  Use MiraLax (over the counter) for constipation as needed.   Diet - low sodium heart healthy   Complete by: As directed    Discharge instructions   Complete by: As directed    Keep dressing wound vac dry and in place. Elevate Right Leg   Increase activity slowly as tolerated   Complete by: As directed    Negative Pressure Wound Therapy - Incisional   Complete by: As directed    Show patient how to attach prevena pump     Follow-up Information    Adonis Huguenin, NP In 1 week.   Specialty: Orthopedic Surgery Contact information: [address] [place] [address] [phone]            Signed: [place] Sharmel Ballantine 12/25/2019, 7:15 AM

## 2019-12-25 NOTE — Progress Notes (Signed)
Physical Therapy Treatment Patient Details Name: Leon Thomas MRN: [id number] DOB: 1956-05-28 Today's Date: 12/25/2019    History of Present Illness Pt is a 64 y.o. M with significant PMH of CHF, L TKA, R rotator cuff tear who presents with right knee osteoarthritis now s/p total knee arthroplasty 12/23/2019.     PT Comments    Pt reclined in chair on arrival.  He required assistance to move chair to standing.  Focused am session on problem solving entry into home.  He ultimately was able to perform with backward facing and use of L rail and cane.  Continue to recommend HHPT.  Will f/u in pm for formal gt training and to review and issued HEP before d/c home.    Follow Up Recommendations  Home health PT;Supervision for mobility/OOB     Equipment Recommendations  Rolling walker with 5" wheels;3in1 (PT)    Recommendations for Other Services       Precautions / Restrictions Precautions Precautions: Fall;Other (comment) Precaution Comments: wound vac Restrictions Weight Bearing Restrictions: Yes RLE Weight Bearing: Weight bearing as tolerated    Mobility  Bed Mobility               General bed mobility comments: OOB in chair  Transfers Overall transfer level: Needs assistance Equipment used: Rolling walker (2 wheeled) Transfers: Sit to/from Stand Sit to Stand: Min assist         General transfer comment: min assistance with increased time and effort to rise into standing and transition hands from chair to RW.  During sitting.  He twists his body to the L and requires cues to reach back for chair with L hand vs. holding the front of the RW.  Pt presents with poor eccentric load.  Noted with a "flop" each time he sits.  Ambulation/Gait Ambulation/Gait assistance: Min guard Gait Distance (Feet): 10 Feet(x2) Assistive device: Rolling walker (2 wheeled) Gait Pattern/deviations: Decreased stance time - right;Decreased weight shift to right;Decreased stride  length;Antalgic;Step-to pattern;Wide base of support     General Gait Details: Wide shuffling pattern to and from stair trainer.  Poor L foot clearance.   Stairs Stairs: Yes Stairs assistance: Min assist Stair Management: No rails;One rail Right;With walker;With cane(x2 backwards with RW. x 2 backwards with L rail and cane.) Number of Stairs: 4 General stair comments: Cues for sequencing and cane and/or RW placement.  Pt required back VCs for backwards negotiation he tried forward and sideways and unable to climb stair at all.   Wheelchair Mobility    Modified Rankin (Stroke Patients Only)       Balance Overall balance assessment: Needs assistance   Sitting balance-Leahy Scale: Normal       Standing balance-Leahy Scale: Poor Standing balance comment: able to maintain static standing with min guard assist                             Cognition Arousal/Alertness: Awake/alert Behavior During Therapy: WFL for tasks assessed/performed Overall Cognitive Status: Within Functional Limits for tasks assessed                                 General Comments: He can be distractable and tangential, likely baseline      Exercises Total Joint Exercises Ankle Circles/Pumps: AROM;Both;20 reps;Supine Quad Sets: AROM;Right;10 reps;Supine Heel Slides: AAROM;Right;10 reps;Supine Hip ABduction/ADduction: AAROM;Right;10 reps;Supine Straight Leg Raises: AAROM;Right;10 reps;Supine  General Comments        Pertinent Vitals/Pain Pain Assessment: 0-10 Pain Score: 8  Pain Location: R knee with weightbearing Pain Descriptors / Indicators: Grimacing;Guarding Pain Intervention(s): Monitored during session;Repositioned    Home Living                      Prior Function            PT Goals (current goals can now be found in the care plan section) Acute Rehab PT Goals Patient Stated Goal: Climb the stairs Potential to Achieve Goals: Good Progress  towards PT goals: Progressing toward goals    Frequency    7X/week      PT Plan Current plan remains appropriate    Co-evaluation              AM-PAC PT "6 Clicks" Mobility   Outcome Measure  Help needed turning from your back to your side while in a flat bed without using bedrails?: None Help needed moving from lying on your back to sitting on the side of a flat bed without using bedrails?: None Help needed moving to and from a bed to a chair (including a wheelchair)?: A Little Help needed standing up from a chair using your arms (e.g., wheelchair or bedside chair)?: A Little Help needed to walk in hospital room?: A Little Help needed climbing 3-5 steps with a railing? : A Little 6 Click Score: 20    End of Session Equipment Utilized During Treatment: Gait belt Activity Tolerance: Patient tolerated treatment well Patient left: in chair;with call bell/phone within reach;with chair alarm set Nurse Communication: Mobility status PT Visit Diagnosis: Pain;Difficulty in walking, not elsewhere classified (R26.2) Pain - Right/Left: Right Pain - part of body: Knee     Time: 1601-0932 PT Time Calculation (min) (ACUTE ONLY): 46 min  Charges:  $Gait Training: 23-37 mins $Therapeutic Exercise: 8-22 mins                     Bonney Leitz , PTA Acute Rehabilitation Services Pager [phone] Office [phone]     Jovahn Breit Artis Delay 12/25/2019, 1:10 PM

## 2019-12-25 NOTE — Plan of Care (Signed)

## 2019-12-27 ENCOUNTER — Other Ambulatory Visit: Payer: Self-pay | Admitting: Cardiology

## 2019-12-27 DIAGNOSIS — I480 Paroxysmal atrial fibrillation: Secondary | ICD-10-CM

## 2019-12-27 MED ORDER — RIVAROXABAN 20 MG PO TABS: 20.0000 mg | ORAL_TABLET | Freq: Every day | ORAL | 3 refills | Status: AC

## 2019-12-27 NOTE — Progress Notes (Signed)
Patient's Xarelto dose was inadvertently changed to 15 mg preprocedurally after his knee replacement, appropriate dose is 20 mg, new prescription was sent today.    ICD-10-CM   1. Paroxysmal atrial fibrillation (HCC)  I48.0 rivaroxaban (XARELTO) 20 MG TABS tablet     Outpatient Encounter Medications as of 12/27/2019  Medication Sig Note  . allopurinol (ZYLOPRIM) 300 MG tablet TAKE 1 TABLET BY MOUTH EVERY DAY (Patient taking differently: Take 300 mg by mouth daily. )   . APPLE CIDER VINEGAR PO Take 2 tablets by mouth daily.   . carvedilol (COREG) 25 MG tablet TAKE 2 TABLETS BY MOUTH EVERY DAY (Patient taking differently: Take 25 mg by mouth in the morning and at bedtime. )   . cloNIDine (CATAPRES - DOSED IN MG/24 HR) 0.2 mg/24hr patch PLACE 1 PATCH (0.2 MG TOTAL) ONTO THE SKIN ONCE A WEEK. (Patient taking differently: Place 0.2 mg onto the skin every three (3) days as needed (elevated blood pressure (lightheaded)). )   . eplerenone (INSPRA) 25 MG tablet Take 1 tablet (25 mg total) by mouth daily. Needs office visit for labs and refills. (Patient not taking: Reported on 12/03/2019)   . furosemide (LASIX) 40 MG tablet TAKE 1 TABLET BY MOUTH EVERY DAY AS NEEDED FOR FLUID (Patient taking differently: Take 40 mg by mouth daily as needed (fluid retention.). )   . olmesartan (BENICAR) 40 MG tablet TAKE 1 TABLET BY MOUTH EVERY DAY (Patient taking differently: Take 40 mg by mouth daily. )   . oxyCODONE (OXY IR/ROXICODONE) 5 MG immediate release tablet Take 1-2 tablets (5-10 mg total) by mouth every 4 (four) hours as needed for moderate pain (pain score 4-6).   . pantoprazole (PROTONIX) 40 MG tablet Take 1 tablet (40 mg total) by mouth daily before breakfast.   . psyllium (METAMUCIL) 58.6 % powder Take 1 packet by mouth daily as needed (constipation/regularity).   . rivaroxaban (XARELTO) 20 MG TABS tablet Take 1 tablet (20 mg total) by mouth daily with supper.   . topiramate (TOPAMAX) 25 MG tablet Take 25 mg by  mouth 2 (two) times daily. 12/03/2019: New medication patient has not started to date  . vitamin B-12 (CYANOCOBALAMIN) 500 MCG tablet Take 500 mcg by mouth daily.   . [DISCONTINUED] XARELTO 15 MG TABS tablet Take 15 mg by mouth daily.    No facility-administered encounter medications on file as of 12/27/2019.

## 2019-12-29 ENCOUNTER — Telehealth: Payer: Self-pay

## 2019-12-29 NOTE — Telephone Encounter (Signed)
[facility] called and advised that pt will see them at 1pm today no need to call.

## 2019-12-29 NOTE — Telephone Encounter (Signed)
Pt will not allow [facility] to come to his home until after 4:30 because that's when his roommate can come and unlock the door fir the therapist. They will not be able to staff this referral for the pt if he is unwilling to let them come during business hours. Will call pt to discuss.

## 2020-01-06 ENCOUNTER — Encounter: Payer: Self-pay | Admitting: Family

## 2020-01-06 ENCOUNTER — Other Ambulatory Visit: Payer: Self-pay

## 2020-01-06 ENCOUNTER — Ambulatory Visit (INDEPENDENT_AMBULATORY_CARE_PROVIDER_SITE_OTHER): Payer: Medicare Other | Admitting: Physician Assistant

## 2020-01-06 VITALS — Ht 74.0 in | Wt 361.0 lb

## 2020-01-06 DIAGNOSIS — M1711 Unilateral primary osteoarthritis, right knee: Secondary | ICD-10-CM

## 2020-01-06 NOTE — Progress Notes (Signed)
Office Visit Note   Patient: Leon Thomas           Date of Birth: January 24, 1956           MRN: [id number] Visit Date: 01/06/2020              Requested by: Forrest Moron, MD [address] [place],  [place] [address] PCP: Forrest Moron, MD  Chief Complaint  Patient presents with  . Right Knee - Routine Post Op    12/23/19 right total knee replacement       HPI: Patient presents today he is now 10 days status post right total knee arthroplasty.  Denies fever chills or calf pain.  He is receiving home physical therapy.  He recently got a refill on his pain medication  Assessment & Plan: Visit Diagnoses: No diagnosis found.  Plan: Patient will continue to work with physical therapy.  Follow-up in 1 week at which time staples will be removed x-rays of his knee should be obtained at that visit  Follow-Up Instructions: No follow-ups on file.   Ortho Exam  Patient is alert, oriented, no adenopathy, well-dressed, normal affect, normal respiratory effort. Right knee: Well-healing surgical incision with opposed edges no wound dehiscence.  Swelling is well controlled.  No cellulitis.  He has flexion to 90 degrees and is lacking 5 degrees of full extension.  Compartments are soft and nontender negative Homans' sign  Imaging: No results found. No images are attached to the encounter.  Labs: Lab Results  Component Value Date   HGBA1C 5.6 07/21/2018   HGBA1C 5.2 05/13/2017     Lab Results  Component Value Date   ALBUMIN 3.9 12/16/2019   ALBUMIN 4.4 03/16/2019   ALBUMIN 3.9 07/21/2018    No results found for: MG No results found for: VD25OH  No results found for: PREALBUMIN CBC EXTENDED Latest Ref Rng & Units 12/16/2019 02/20/2018 01/09/2018  WBC 4.0 - 10.5 K/uL 5.2 5.9 4.9  RBC 4.22 - 5.81 MIL/uL 5.36 4.87 4.03(L)  HGB 13.0 - 17.0 g/dL 15.9 14.2 12.5(L)  HCT 39.0 - 52.0 % 47.7 43.4 36.4(L)  PLT 150 - 400 K/uL 195 174 134(L)  NEUTROABS 1.7 - 7.7 K/uL - - -    LYMPHSABS 0.7 - 4.0 K/uL - - -     Body mass index is 46.35 kg/m.  Orders:  No orders of the defined types were placed in this encounter.  No orders of the defined types were placed in this encounter.    Procedures: No procedures performed  Clinical Data: No additional findings.  ROS:  All other systems negative, except as noted in the HPI. Review of Systems  Objective: Vital Signs: Ht 6\' 2"  (1.88 m)   Wt (!) 361 lb (163.7 kg)   BMI 46.35 kg/m   Specialty Comments:  No specialty comments available.  PMFS History: Patient Active Problem List   Diagnosis Date Noted  . Arthritis of right knee 12/23/2019  . Chronic gastritis 01/28/2018  . GI bleed 01/07/2018  . Unilateral primary osteoarthritis, right knee 12/12/2017  . Chronic prescription opiate use 12/12/2017  . Chronic pain syndrome 12/12/2017  . Atrial fibrillation ([place]) 05/13/2017  . Gout, arthritis 05/13/2017  . Osteoarthritis of left knee 05/13/2017  . Morbid obesity ([place]) 05/13/2017  . Chronic anticoagulation 05/13/2017  . History of knee joint replacement 05/13/2017  . Chronic use of opiate drugs therapeutic purposes 05/13/2017  . Anosmia 06/20/2016  . OSA (obstructive  sleep apnea) 06/20/2016   Past Medical History:  Diagnosis Date  . Arthritis    knees  . CHF (congestive heart failure) (HCC)     " A long time ago"  . Dysrhythmia    04-21-13 Cardioverted at [facility] for A. Fib-Dr. Jacinto Halim. No further problems  . GI bleed   . Gout   . Hypertension   . Rotator cuff tear, right   . Sleep apnea    cpap nightly, settings 3    Family History  Problem Relation Age of Onset  . Diabetes Mother   . Alzheimer's disease Mother   . Diabetes Father   . Diabetes Brother   . Stroke Brother   . Stroke Sister     Past Surgical History:  Procedure Laterality Date  . BIOPSY  01/09/2018   Procedure: BIOPSY;  Surgeon: Kathi Der, MD;  Location: [facility];  Service: Gastroenterology;;  .  CARDIOVERSION N/A 04/21/2013   Procedure: CARDIOVERSION;  Surgeon: [name], MD;  Location: [facility] ENDOSCOPY;  Service: Cardiovascular;  Laterality: N/A;  h&p in file- HW  . COLONOSCOPY WITH PROPOFOL N/A 09/04/2013   Procedure: COLONOSCOPY WITH PROPOFOL;  Surgeon: Vertell Novak., MD;  Location: [facility] ENDOSCOPY;  Service: Endoscopy;  Laterality: N/A;  . COLONOSCOPY WITH PROPOFOL N/A 06/01/2019   Procedure: COLONOSCOPY WITH PROPOFOL;  Surgeon: Carman Ching, MD;  Location: [facility] ENDOSCOPY;  Service: Endoscopy;  Laterality: N/A;  . ESOPHAGOGASTRODUODENOSCOPY (EGD) WITH PROPOFOL N/A 01/09/2018   Procedure: ESOPHAGOGASTRODUODENOSCOPY (EGD) WITH PROPOFOL;  Surgeon: Kathi Der, MD;  Location: [facility] ENDOSCOPY;  Service: Gastroenterology;  Laterality: N/A;  . FOOT SURGERY Left   . FRACTURE SURGERY    . JOINT REPLACEMENT Left   . ORIF ANKLE FRACTURE Right   . SHOULDER ARTHROSCOPY WITH ROTATOR CUFF REPAIR AND SUBACROMIAL DECOMPRESSION Right 03/20/2018   Procedure: SHOULDER ARTHROSCOPY WITH ROTATOR CUFF REPAIR AND SUBACROMIAL DECOMPRESSION;  Surgeon: [name], MD;  Location: [facility] OR;  Service: Orthopedics;  Laterality: Right;  . TOTAL KNEE ARTHROPLASTY Right 12/23/2019   Procedure: RIGHT TOTAL KNEE ARTHROPLASTY;  Surgeon: Nadara Mustard, MD;  Location: [facility] OR;  Service: Orthopedics;  Laterality: Right;   Social History   Occupational History  . Not on file  Tobacco Use  . Smoking status: Never Smoker  . Smokeless tobacco: Never Used  Substance and Sexual Activity  . Alcohol use: Yes    Alcohol/week: 10.0 standard drinks    Types: 8 Cans of beer, 2 Shots of liquor per week  . Drug use: No  . Sexual activity: Not on file

## 2020-01-13 ENCOUNTER — Ambulatory Visit: Payer: Self-pay

## 2020-01-13 ENCOUNTER — Encounter: Payer: Self-pay | Admitting: Family

## 2020-01-13 ENCOUNTER — Ambulatory Visit (INDEPENDENT_AMBULATORY_CARE_PROVIDER_SITE_OTHER): Payer: Medicare Other | Admitting: Family

## 2020-01-13 ENCOUNTER — Other Ambulatory Visit: Payer: Self-pay

## 2020-01-13 VITALS — Ht 74.0 in | Wt 361.0 lb

## 2020-01-13 DIAGNOSIS — M1711 Unilateral primary osteoarthritis, right knee: Secondary | ICD-10-CM | POA: Diagnosis not present

## 2020-01-13 NOTE — Progress Notes (Signed)
Post-Op Visit Note   Patient: Leon Thomas           Date of Birth: 1955-10-26           MRN: [id number] Visit Date: 01/13/2020 PCP: Forrest Moron, MD  Chief Complaint:  Chief Complaint  Patient presents with  . Right Knee - Routine Post Op    12/23/19 right total knee replacement     HPI:  HPI The patient is a 64 year old gentleman seen today status post right total knee arthroplasty on May 20 states he has been working with home physical therapy he is doing quite well.  States his pain today is a 4 out of 10.  Does not need any more pain medication.  He is using a rolling walker.  Ortho Exam Incision is well-healed staples are in place.  There is moderate swelling of his knee he has full extension and flexion to 90 degrees.  Visit Diagnoses:  1. Arthritis of right knee     Plan: Staples harvested without incident.  He will continue with physical therapy.  He will follow-up once more in 4 weeks.  Follow-Up Instructions: Return in about 4 weeks (around 02/10/2020).   Imaging: No results found.  Orders:  Orders Placed This Encounter  Procedures  . XR Knee 1-2 Views Right   No orders of the defined types were placed in this encounter.    PMFS History: Patient Active Problem List   Diagnosis Date Noted  . Arthritis of right knee 12/23/2019  . Chronic gastritis 01/28/2018  . GI bleed 01/07/2018  . Unilateral primary osteoarthritis, right knee 12/12/2017  . Chronic prescription opiate use 12/12/2017  . Chronic pain syndrome 12/12/2017  . Atrial fibrillation ([place]) 05/13/2017  . Gout, arthritis 05/13/2017  . Osteoarthritis of left knee 05/13/2017  . Morbid obesity ([place]) 05/13/2017  . Chronic anticoagulation 05/13/2017  . History of knee joint replacement 05/13/2017  . Chronic use of opiate drugs therapeutic purposes 05/13/2017  . Anosmia 06/20/2016  . OSA (obstructive sleep apnea) 06/20/2016   Past Medical History:  Diagnosis Date  . Arthritis    knees  .  CHF (congestive heart failure) ([place])     " A long time ago"  . Dysrhythmia    04-21-13 Cardioverted at [facility] for A. Fib-Dr. Einar Gip. No further problems  . GI bleed   . Gout   . Hypertension   . Rotator cuff tear, right   . Sleep apnea    cpap nightly, settings 3    Family History  Problem Relation Age of Onset  . Diabetes Mother   . Alzheimer's disease Mother   . Diabetes Father   . Diabetes Brother   . Stroke Brother   . Stroke Sister     Past Surgical History:  Procedure Laterality Date  . BIOPSY  01/09/2018   Procedure: BIOPSY;  Surgeon: Otis Brace, MD;  Location: [facility];  Service: Gastroenterology;;  . CARDIOVERSION N/A 04/21/2013   Procedure: CARDIOVERSION;  Surgeon: Laverda Page, MD;  Location: [facility] ENDOSCOPY;  Service: Cardiovascular;  Laterality: N/A;  h&p in file- HW  . COLONOSCOPY WITH PROPOFOL N/A 09/04/2013   Procedure: COLONOSCOPY WITH PROPOFOL;  Surgeon: Winfield Cunas., MD;  Location: [facility] ENDOSCOPY;  Service: Endoscopy;  Laterality: N/A;  . COLONOSCOPY WITH PROPOFOL N/A 06/01/2019   Procedure: COLONOSCOPY WITH PROPOFOL;  Surgeon: Laurence Spates, MD;  Location: [facility] ENDOSCOPY;  Service: Endoscopy;  Laterality: N/A;  . ESOPHAGOGASTRODUODENOSCOPY (EGD) WITH PROPOFOL N/A 01/09/2018  Procedure: ESOPHAGOGASTRODUODENOSCOPY (EGD) WITH PROPOFOL;  Surgeon: Kathi Der, MD;  Location: [facility] ENDOSCOPY;  Service: Gastroenterology;  Laterality: N/A;  . FOOT SURGERY Left   . FRACTURE SURGERY    . JOINT REPLACEMENT Left   . ORIF ANKLE FRACTURE Right   . SHOULDER ARTHROSCOPY WITH ROTATOR CUFF REPAIR AND SUBACROMIAL DECOMPRESSION Right 03/20/2018   Procedure: SHOULDER ARTHROSCOPY WITH ROTATOR CUFF REPAIR AND SUBACROMIAL DECOMPRESSION;  Surgeon: [name], MD;  Location: [facility] OR;  Service: Orthopedics;  Laterality: Right;  . TOTAL KNEE ARTHROPLASTY Right 12/23/2019   Procedure: RIGHT TOTAL KNEE ARTHROPLASTY;  Surgeon: Nadara Mustard, MD;  Location: [facility] OR;   Service: Orthopedics;  Laterality: Right;   Social History   Occupational History  . Not on file  Tobacco Use  . Smoking status: Never Smoker  . Smokeless tobacco: Never Used  Vaping Use  . Vaping Use: Never used  Substance and Sexual Activity  . Alcohol use: Yes    Alcohol/week: 10.0 standard drinks    Types: 8 Cans of beer, 2 Shots of liquor per week  . Drug use: No  . Sexual activity: Not on file

## 2020-01-19 ENCOUNTER — Other Ambulatory Visit: Payer: Self-pay

## 2020-01-19 DIAGNOSIS — M1711 Unilateral primary osteoarthritis, right knee: Secondary | ICD-10-CM

## 2020-01-26 ENCOUNTER — Ambulatory Visit: Payer: Medicare Other | Attending: Orthopedic Surgery

## 2020-01-26 ENCOUNTER — Other Ambulatory Visit: Payer: Self-pay

## 2020-01-26 DIAGNOSIS — R262 Difficulty in walking, not elsewhere classified: Secondary | ICD-10-CM

## 2020-01-26 DIAGNOSIS — G8929 Other chronic pain: Secondary | ICD-10-CM | POA: Diagnosis present

## 2020-01-26 DIAGNOSIS — M25561 Pain in right knee: Secondary | ICD-10-CM | POA: Insufficient documentation

## 2020-01-26 NOTE — Therapy (Signed)
[facility] [address] [place], [address] Phone: [phone]   Fax:  [phone]  Physical Therapy Evaluation  Patient Details  Name: Leon Thomas MRN: [id number] Date of Birth: 03/08/1956 Referring Provider (PT): Aldean Baker, MD   Encounter Date: 01/26/2020   PT End of Session - 01/26/20 1439    Visit Number 1    Number of Visits 13    Date for PT Re-Evaluation 03/10/20    PT Start Time 1439    PT Stop Time 1540    PT Time Calculation (min) 61 min    Activity Tolerance Patient tolerated treatment well    Behavior During Therapy [facility] for tasks assessed/performed           Past Medical History:  Diagnosis Date  . Arthritis    knees  . CHF (congestive heart failure) (HCC)     " A long time ago"  . Dysrhythmia    04-21-13 Cardioverted at [facility] for A. Fib-Dr. Jacinto Halim. No further problems  . GI bleed   . Gout   . Hypertension   . Rotator cuff tear, right   . Sleep apnea    cpap nightly, settings 3    Past Surgical History:  Procedure Laterality Date  . BIOPSY  01/09/2018   Procedure: BIOPSY;  Surgeon: Kathi Der, MD;  Location: [facility];  Service: Gastroenterology;;  . CARDIOVERSION N/A 04/21/2013   Procedure: CARDIOVERSION;  Surgeon: [name], MD;  Location: [facility] ENDOSCOPY;  Service: Cardiovascular;  Laterality: N/A;  h&p in file- HW  . COLONOSCOPY WITH PROPOFOL N/A 09/04/2013   Procedure: COLONOSCOPY WITH PROPOFOL;  Surgeon: Vertell Novak., MD;  Location: [facility] ENDOSCOPY;  Service: Endoscopy;  Laterality: N/A;  . COLONOSCOPY WITH PROPOFOL N/A 06/01/2019   Procedure: COLONOSCOPY WITH PROPOFOL;  Surgeon: Carman Ching, MD;  Location: [facility] ENDOSCOPY;  Service: Endoscopy;  Laterality: N/A;  . ESOPHAGOGASTRODUODENOSCOPY (EGD) WITH PROPOFOL N/A 01/09/2018   Procedure: ESOPHAGOGASTRODUODENOSCOPY (EGD) WITH PROPOFOL;  Surgeon: Kathi Der, MD;  Location: [facility] ENDOSCOPY;   Service: Gastroenterology;  Laterality: N/A;  . FOOT SURGERY Left   . FRACTURE SURGERY    . JOINT REPLACEMENT Left   . ORIF ANKLE FRACTURE Right   . SHOULDER ARTHROSCOPY WITH ROTATOR CUFF REPAIR AND SUBACROMIAL DECOMPRESSION Right 03/20/2018   Procedure: SHOULDER ARTHROSCOPY WITH ROTATOR CUFF REPAIR AND SUBACROMIAL DECOMPRESSION;  Surgeon: [name], MD;  Location: [facility] OR;  Service: Orthopedics;  Laterality: Right;  . TOTAL KNEE ARTHROPLASTY Right 12/23/2019   Procedure: RIGHT TOTAL KNEE ARTHROPLASTY;  Surgeon: Nadara Mustard, MD;  Location: [facility] OR;  Service: Orthopedics;  Laterality: Right;    There were no vitals filed for this visit.    Subjective Assessment - 01/26/20 1442    Subjective R knee: 4/10 currently (stiff and tight), 7/10 at worst for the past 3 weeks.    Pertinent History S/P R TKA on 12/23/2019. Pt is currently 4-5 weeks post surgery. Pt had home health PT after surgery. Pt has been consistent with his exercises. Pt started with a rw, then transitioned to a SPC, and started walking independently last week.   February 10, 2020 is next MD appointment.  No AD prior to surgery but had a lot of pain. Pt currently works as a Curator part time.    Patient Stated Goals Decreased R knee and leg tightness.    Currently in Pain? Yes    Pain Score 4  Pain Location Knee    Pain Orientation Right    Pain Descriptors / Indicators Tightness    Pain Type Surgical pain;Chronic pain    Pain Onset More than a month ago    Pain Frequency Occasional              [facility] PT Assessment - 01/26/20 1441      Assessment   Medical Diagnosis arthritis of R knee    Referring Provider (PT) Aldean Baker, MD    Onset Date/Surgical Date 12/23/19    Prior Therapy Had home health PT with positive results      Precautions   Precaution Comments R TKA      Restrictions   Other Position/Activity Restrictions no known weight bearing restrictions      Balance Screen   Has the patient fallen in  the past 6 months Yes    How many times? 3 time prior to surgery secondary R knee buckling on him and pain.     Has the patient had a decrease in activity level because of a fear of falling?  Yes    Is the patient reluctant to leave their home because of a fear of falling?  No      Home Environment   Additional Comments Pt lives in a 1 story home with 8 steps to enter front door, R rail. Pt lives with a significant other.       Prior Function   Vocation Part time employment      Observation/Other Assessments   Observations surgical incision healing well, scab formation mid and distal scar.     Focus on Therapeutic Outcomes (FOTO)  knee FOTO 55      Posture/Postural Control   Posture Comments Increased lumbar lordosis, decreased bilateral hip extension, L LE weight bearing , decreased R knee extension       AROM   Right Knee Extension -5    Right Knee Flexion 91      Strength   Right Hip Flexion 4-/5    Right Hip ABduction 4+/5    Left Hip Flexion 4/5    Right Knee Flexion 4+/5    Right Knee Extension 5/5      Palpation   Patella mobility decreased scar tissue mobility and areas around it       Ambulation/Gait   Gait Comments No AD, antalgic, decreased stance R LE, decreased R knee extension during stance phase, L lateral lean.   Decreased R knee flexion during swing phase                      Objective measurements completed on examination: See above findings.   No latex band allergies Blood pressure is controlled per pt.    Medbridge Access Code: [email or web address]  Manual therapy   Seated STM scar tissue     Therapeutic exercise  Seated R knee flexion PROM 10x3 Seated R knee flexion AAROM 10x3  standing gastroc stretch 30 seconds x 3  standing R knee flexion stretch on 2nd stair step with B UE assist 10x5 seconds    Gait with knee flexion during swing phase and knee extension during stance phase and heel strike 75 ft, no AD    Reviewed HEP. Pt  demonstrated and verbalized understanding. Handout provided.    Improved exercise technique, movement at target joints, use of target muscles after mod verbal, visual, tactile cues.     Response to treatment Pt tolerated session well without aggravation  of symptoms.   Clinical impression Patient is a 64 year old male who came to physical therapy S/P R TKA on 12/23/2019 secondary to pain. He currently presents with limited R knee extension and flexion AROM, joint stiffness and soft tissue restrictions, swelling, altered gait pattern and posture, and difficulty performing standing tasks as well as ambulating and stair negotiation. Pt will benefit from skilled physical therapy services to address the aforementioned deficits.          PT Education - 01/26/20 1547    Education Details ther-ex, HEP, plan of care    Person(s) Educated Patient    Methods Explanation;Demonstration;Tactile cues;Verbal cues;Handout    Comprehension Returned demonstration;Verbalized understanding            PT Short Term Goals - 01/26/20 1559      PT SHORT TERM GOAL #1   Title Patient will be independent with his HEP to improve R knee flexion, extension AROM, R LE strength, improve ability to ambulate and negotiate stairs with less difficulty.    Baseline Pt has started his HEP (01/26/2020)    Time 3    Period Weeks    Status New    Target Date 02/18/20             PT Long Term Goals - 01/26/20 1608      PT LONG TERM GOAL #1   Title Pt will improve R knee flexion AROM to 115 degrees or more to promote ability to perform transfers, squat, and negotiate stairs with less difficulty.    Baseline R knee flexion AROM 91 degrees (01/26/2020)    Time 6    Period Weeks    Status New    Target Date 03/10/20      PT LONG TERM GOAL #2   Title Patient will improve R knee extension AROM to 0 degrees to promote ability to ambulate as well as perform standing tasks with less difficulty.    Baseline -5 degrees  extension AROM R knee (01/26/2020)    Time 6    Period Weeks    Status New    Target Date 03/10/20      PT LONG TERM GOAL #3   Title Patient will improve his R knee FOTO score by at least 10 points as a demonstration of improved function.    Baseline Knee FOTO 55 (01/26/2020)    Time 6    Period Weeks    Status New    Target Date 03/10/20                  Plan - 01/26/20 1548    Clinical Impression Statement Patient is a 64 year old male who came to physical therapy S/P R TKA on 12/23/2019 secondary to pain. He currently presents with limited R knee extension and flexion AROM, joint stiffness and soft tissue restrictions, swelling, altered gait pattern and posture, and difficulty performing standing tasks as well as ambulating and stair negotiation. Pt will benefit from skilled physical therapy services to address the aforementioned deficits.    Personal Factors and Comorbidities Time since onset of injury/illness/exacerbation;Past/Current Experience;Comorbidity 3+    Comorbidities CHF, HTN, gout, arthritis    Examination-Activity Limitations Squat;Stairs;Transfers    Stability/Clinical Decision Making Stable/Uncomplicated    Clinical Decision Making Low    Rehab Potential Good    PT Frequency 2x / week    PT Duration 6 weeks    PT Treatment/Interventions Electrical Stimulation;Iontophoresis 4mg /ml Dexamethasone;Aquatic Therapy;Gait training;Stair training;Functional mobility training;Therapeutic activities;Therapeutic  exercise;Balance training;Neuromuscular re-education;Patient/family education;Manual techniques;Dry needling    PT Next Visit Plan knee ROM, gait, strengthening, balance, manual techinques, modalities PRN    PT Home Exercise Plan Medbridge Access Code: [email or web address]    Consulted and Agree with Plan of Care Patient           Patient will benefit from skilled therapeutic intervention in order to improve the following deficits and impairments:  Pain, Postural  dysfunction, Improper body mechanics, Difficulty walking, Decreased strength, Decreased range of motion, Abnormal gait  Visit Diagnosis: Chronic pain of right knee - Plan: PT plan of care cert/re-cert  Difficulty in walking, not elsewhere classified - Plan: PT plan of care cert/re-cert     Problem List Patient Active Problem List   Diagnosis Date Noted  . Arthritis of right knee 12/23/2019  . Chronic gastritis 01/28/2018  . GI bleed 01/07/2018  . Unilateral primary osteoarthritis, right knee 12/12/2017  . Chronic prescription opiate use 12/12/2017  . Chronic pain syndrome 12/12/2017  . Atrial fibrillation (HCC) 05/13/2017  . Gout, arthritis 05/13/2017  . Osteoarthritis of left knee 05/13/2017  . Morbid obesity (HCC) 05/13/2017  . Chronic anticoagulation 05/13/2017  . History of knee joint replacement 05/13/2017  . Chronic use of opiate drugs therapeutic purposes 05/13/2017  . Anosmia 06/20/2016  . OSA (obstructive sleep apnea) 06/20/2016    Loralyn FreshwaterMiguel Nicle Connole PT, DPT   01/26/2020, 4:22 PM  [facility] [address] [place], [place], [address] Phone: [phone]   Fax:  [phone]  Name: Leon Thomas MRN: [id number] Date of Birth: 03-May-1956

## 2020-01-26 NOTE — Patient Instructions (Signed)
Access Code: [email or web address] URL: [email or web address] Date: 01/26/2020 Prepared by: Loralyn Freshwater  Exercises Standing Bilateral Gastroc Stretch with Step - 3 x daily - 7 x weekly - 2 sets - 3 reps - 30 seconds hold Standing Knee Flexion Stretch on Step - 2-3 x daily - 7 x weekly - 3 sets - 10 reps - 5 seconds hold

## 2020-01-28 ENCOUNTER — Ambulatory Visit: Payer: Medicare Other | Attending: Orthopedic Surgery

## 2020-01-28 ENCOUNTER — Other Ambulatory Visit: Payer: Self-pay

## 2020-01-28 DIAGNOSIS — M25561 Pain in right knee: Secondary | ICD-10-CM | POA: Insufficient documentation

## 2020-01-28 DIAGNOSIS — R262 Difficulty in walking, not elsewhere classified: Secondary | ICD-10-CM | POA: Diagnosis present

## 2020-01-28 DIAGNOSIS — G8929 Other chronic pain: Secondary | ICD-10-CM | POA: Diagnosis present

## 2020-01-28 NOTE — Therapy (Signed)
[facility] PHYSICAL AND SPORTS MEDICINE [address], [place], [address] Phone: [phone]   Fax:  [phone]  Physical Therapy Treatment  Patient Details  Name: Leon Thomas MRN: [id number] Date of Birth: 1955/12/07 Referring Provider (PT): Aldean Baker, MD   Encounter Date: 01/28/2020   PT End of Session - 01/28/20 1120    Visit Number 2    Number of Visits 13    Date for PT Re-Evaluation 03/10/20    Authorization Type 2    Authorization Time Period of 10 progress report    PT Start Time 1120    PT Stop Time 1203    PT Time Calculation (min) 43 min    Activity Tolerance Patient tolerated treatment well    Behavior During Therapy [facility] for tasks assessed/performed           Past Medical History:  Diagnosis Date  . Arthritis    knees  . CHF (congestive heart failure) (HCC)     " A long time ago"  . Dysrhythmia    04-21-13 Cardioverted at [facility] for A. Fib-Dr. Jacinto Halim. No further problems  . GI bleed   . Gout   . Hypertension   . Rotator cuff tear, right   . Sleep apnea    cpap nightly, settings 3    Past Surgical History:  Procedure Laterality Date  . BIOPSY  01/09/2018   Procedure: BIOPSY;  Surgeon: Kathi Der, MD;  Location: [facility];  Service: Gastroenterology;;  . CARDIOVERSION N/A 04/21/2013   Procedure: CARDIOVERSION;  Surgeon: [name], MD;  Location: [facility] ENDOSCOPY;  Service: Cardiovascular;  Laterality: N/A;  h&p in file- HW  . COLONOSCOPY WITH PROPOFOL N/A 09/04/2013   Procedure: COLONOSCOPY WITH PROPOFOL;  Surgeon: Vertell Novak., MD;  Location: [facility] ENDOSCOPY;  Service: Endoscopy;  Laterality: N/A;  . COLONOSCOPY WITH PROPOFOL N/A 06/01/2019   Procedure: COLONOSCOPY WITH PROPOFOL;  Surgeon: Carman Ching, MD;  Location: [facility] ENDOSCOPY;  Service: Endoscopy;  Laterality: N/A;  . ESOPHAGOGASTRODUODENOSCOPY (EGD) WITH PROPOFOL N/A 01/09/2018   Procedure: ESOPHAGOGASTRODUODENOSCOPY (EGD) WITH  PROPOFOL;  Surgeon: Kathi Der, MD;  Location: [facility] ENDOSCOPY;  Service: Gastroenterology;  Laterality: N/A;  . FOOT SURGERY Left   . FRACTURE SURGERY    . JOINT REPLACEMENT Left   . ORIF ANKLE FRACTURE Right   . SHOULDER ARTHROSCOPY WITH ROTATOR CUFF REPAIR AND SUBACROMIAL DECOMPRESSION Right 03/20/2018   Procedure: SHOULDER ARTHROSCOPY WITH ROTATOR CUFF REPAIR AND SUBACROMIAL DECOMPRESSION;  Surgeon: [name], MD;  Location: [facility] OR;  Service: Orthopedics;  Laterality: Right;  . TOTAL KNEE ARTHROPLASTY Right 12/23/2019   Procedure: RIGHT TOTAL KNEE ARTHROPLASTY;  Surgeon: Nadara Mustard, MD;  Location: [facility] OR;  Service: Orthopedics;  Laterality: Right;    There were no vitals filed for this visit.   Subjective Assessment - 01/28/20 1121    Subjective R knee was a 7/10 yesterday. 4/10 currently (took pain medicine). Was alright after Tuesday's session.    Pertinent History S/P R TKA on 12/23/2019. Pt is currently 4-5 weeks post surgery. Pt had home health PT after surgery. Pt has been consistent with his exercises. Pt started with a rw, then transitioned to a SPC, and started walking independently last week.   February 10, 2020 is next MD appointment.  No AD prior to surgery but had a lot of pain. Pt currently works as a Curator part time.    Patient Stated Goals Decreased R knee and leg tightness.  Currently in Pain? Yes    Pain Score 4     Pain Onset More than a month ago                                     PT Education - 01/28/20 1221    Education Details ther-ex    Person(s) Educated Patient    Methods Explanation;Demonstration;Tactile cues;Verbal cues    Comprehension Returned demonstration;Verbalized understanding          Objective    No latex band allergies Blood pressure is controlled per pt.    Medbridge Access Code: [email or web address]  Manual therapy  Seated STM peri scar tissue area, shin to decrease fascial restrictions  Seated  STM vastus lateralis, lateral hamstrings, vastus medialis to decrease muscle tension   Seated STM R IT band      Therapeutic exercise  Seated R knee flexion PROM 10x3 Seated R knee flexion AAROM 5x3 with 5 second holds   Gait with emphasis in improving knee flexion during swing phase.     Improved exercise technique, movement at target joints, use of target muscles after mod verbal, visual, tactile cues.     Response to treatment Pt tolerated session well without aggravation of symptoms. 93 degrees seated knee flexion after treatment.  Clinical impression Pt demonstrates significant stiffness a his R knee including scar tissue and fascial restrictions. Continued working on improving fascial mobility from his R knee to his shin to decrease restrictions. Per pt, stiffness is felt from his knee to his anterior and anterior medial leg. Pt able to achieve 93 degrees seated R knee flexion AROM after session. Pt to continue his HEP to work on ROM and strength. Pt tolerated session well without aggravation of symptoms. Pt will benefit from continued skilled physical therapy services to improve ROM, strength, function, and ability to ambulate and perform transfers with less difficulty.         PT Short Term Goals - 01/26/20 1559      PT SHORT TERM GOAL #1   Title Patient will be independent with his HEP to improve R knee flexion, extension AROM, R LE strength, improve ability to ambulate and negotiate stairs with less difficulty.    Baseline Pt has started his HEP (01/26/2020)    Time 3    Period Weeks    Status New    Target Date 02/18/20             PT Long Term Goals - 01/26/20 1608      PT LONG TERM GOAL #1   Title Pt will improve R knee flexion AROM to 115 degrees or more to promote ability to perform transfers, squat, and negotiate stairs with less difficulty.    Baseline R knee flexion AROM 91 degrees (01/26/2020)    Time 6    Period Weeks    Status New     Target Date 03/10/20      PT LONG TERM GOAL #2   Title Patient will improve R knee extension AROM to 0 degrees to promote ability to ambulate as well as perform standing tasks with less difficulty.    Baseline -5 degrees extension AROM R knee (01/26/2020)    Time 6    Period Weeks    Status New    Target Date 03/10/20      PT LONG TERM GOAL #3   Title Patient will improve his R  knee FOTO score by at least 10 points as a demonstration of improved function.    Baseline Knee FOTO 55 (01/26/2020)    Time 6    Period Weeks    Status New    Target Date 03/10/20                 Plan - 01/28/20 1219    Clinical Impression Statement Pt demonstrates significant stiffness a his R knee including scar tissue and fascial restrictions. Continued working on improving fascial mobility from his R knee to his shin to decrease restrictions. Per pt, stiffness is felt from his knee to his anterior and anterior medial leg. Pt able to achieve 93 degrees seated R knee flexion AROM after session. Pt to continue his HEP to work on ROM and strength. Pt tolerated session well without aggravation of symptoms. Pt will benefit from continued skilled physical therapy services to improve ROM, strength, function, and ability to ambulate and perform transfers with less difficulty.    Personal Factors and Comorbidities Time since onset of injury/illness/exacerbation;Past/Current Experience;Comorbidity 3+    Comorbidities CHF, HTN, gout, arthritis    Examination-Activity Limitations Squat;Stairs;Transfers    Stability/Clinical Decision Making Stable/Uncomplicated    Rehab Potential Good    PT Frequency 2x / week    PT Duration 6 weeks    PT Treatment/Interventions Electrical Stimulation;Iontophoresis 4mg /ml Dexamethasone;Aquatic Therapy;Gait training;Stair training;Functional mobility training;Therapeutic activities;Therapeutic exercise;Balance training;Neuromuscular re-education;Patient/family education;Manual  techniques;Dry needling    PT Next Visit Plan knee ROM, gait, strengthening, balance, manual techinques, modalities PRN    PT Home Exercise Plan Medbridge Access Code:    Consulted and Agree with Plan of Care Patient           Patient will benefit from skilled therapeutic intervention in order to improve the following deficits and impairments:  Pain, Postural dysfunction, Improper body mechanics, Difficulty walking, Decreased strength, Decreased range of motion, Abnormal gait  Visit Diagnosis: Chronic pain of right knee  Difficulty in walking, not elsewhere classified     Problem List Patient Active Problem List   Diagnosis Date Noted  . Arthritis of right knee 12/23/2019  . Chronic gastritis 01/28/2018  . GI bleed 01/07/2018  . Unilateral primary osteoarthritis, right knee 12/12/2017  . Chronic prescription opiate use 12/12/2017  . Chronic pain syndrome 12/12/2017  . Atrial fibrillation (HCC) 05/13/2017  . Gout, arthritis 05/13/2017  . Osteoarthritis of left knee 05/13/2017  . Morbid obesity (HCC) 05/13/2017  . Chronic anticoagulation 05/13/2017  . History of knee joint replacement 05/13/2017  . Chronic use of opiate drugs therapeutic purposes 05/13/2017  . Anosmia 06/20/2016  . OSA (obstructive sleep apnea) 06/20/2016    06/22/2016 PT, DPT   01/28/2020, 12:24 PM  [facility] PHYSICAL AND SPORTS MEDICINE [address], [place] Phone: [phone]   Fax:  [phone]  Name: Leon Thomas MRN: Anabel Halon Date of Birth: [birth year]

## 2020-02-03 ENCOUNTER — Ambulatory Visit: Payer: Medicare Other

## 2020-02-05 ENCOUNTER — Encounter: Payer: Self-pay | Admitting: Family Medicine

## 2020-02-08 ENCOUNTER — Ambulatory Visit: Payer: Medicare Other

## 2020-02-10 ENCOUNTER — Ambulatory Visit: Payer: Medicare Other

## 2020-02-10 ENCOUNTER — Other Ambulatory Visit: Payer: Self-pay

## 2020-02-10 ENCOUNTER — Ambulatory Visit (INDEPENDENT_AMBULATORY_CARE_PROVIDER_SITE_OTHER): Payer: Medicare Other | Admitting: Physician Assistant

## 2020-02-10 ENCOUNTER — Encounter: Payer: Self-pay | Admitting: Physician Assistant

## 2020-02-10 VITALS — Ht 74.0 in | Wt 361.0 lb

## 2020-02-10 DIAGNOSIS — M1711 Unilateral primary osteoarthritis, right knee: Secondary | ICD-10-CM

## 2020-02-10 NOTE — Progress Notes (Signed)
Office Visit Note   Patient: Leon Thomas           Date of Birth: 1956/06/09           MRN: [id number] Visit Date: 02/10/2020              Requested by: Doristine Bosworth, MD [phone] [address] [place],  [place] [address] PCP: Doristine Bosworth, MD  Chief Complaint  Patient presents with  . Right Knee - Routine Post Op    12/23/19 right total knee replacement         HPI: The patient presents today 6 weeks status post right total knee replacement.  Other than stiffness in his leg he denies any concerns.  He denies fever, chills, or calf pain he does say that he has tightness in his legs from swelling but he had this on his other leg after knee replacement and it resolved.  He has already seen improvement with physical therapy.  Since surgery patient has lost about 50 pounds  Assessment & Plan: Visit Diagnoses: No diagnosis found.  Plan: Patient will follow up in 6 weeks he would like to see Dr. Lajoyce Corners at that visit.  We did discuss compression stockings and he is going to look at them at [facility] as he has a coupon there.  And could get them for free  Follow-Up Instructions: No follow-ups on file.   Ortho Exam  Patient is alert, oriented, no adenopathy, well-dressed, normal affect, normal respiratory effort. Well-healed surgical incision he does have swelling in his right lower leg but nontender in the calf no pain with passive stretch.  No cellulitis he has excellent knee range of motion with flexion to almost 120 degrees and full extension no evidence of any infection  Imaging: No results found. No images are attached to the encounter.  Labs: Lab Results  Component Value Date   HGBA1C 5.6 07/21/2018   HGBA1C 5.2 05/13/2017     Lab Results  Component Value Date   ALBUMIN 3.9 12/16/2019   ALBUMIN 4.4 03/16/2019   ALBUMIN 3.9 07/21/2018    No results found for: MG No results found for: VD25OH  No results found for: PREALBUMIN CBC EXTENDED Latest Ref  Rng & Units 12/16/2019 02/20/2018 01/09/2018  WBC 4.0 - 10.5 K/uL 5.2 5.9 4.9  RBC 4.22 - 5.81 MIL/uL 5.36 4.87 4.03(L)  HGB 13.0 - 17.0 g/dL 37.8 58.8 12.5(L)  HCT 39 - 52 % 47.7 43.4 36.4(L)  PLT 150 - 400 K/uL 195 174 134(L)  NEUTROABS 1.7 - 7.7 K/uL - - -  LYMPHSABS 0.7 - 4.0 K/uL - - -     Body mass index is 46.35 kg/m.  Orders:  No orders of the defined types were placed in this encounter.  No orders of the defined types were placed in this encounter.    Procedures: No procedures performed  Clinical Data: No additional findings.  ROS:  All other systems negative, except as noted in the HPI. Review of Systems  Objective: Vital Signs: Ht 6\' 2"  (1.88 m)   Wt (!) 361 lb (163.7 kg)   BMI 46.35 kg/m   Specialty Comments:  No specialty comments available.  PMFS History: Patient Active Problem List   Diagnosis Date Noted  . Arthritis of right knee 12/23/2019  . Chronic gastritis 01/28/2018  . GI bleed 01/07/2018  . Unilateral primary osteoarthritis, right knee 12/12/2017  . Chronic prescription opiate use 12/12/2017  .  Chronic pain syndrome 12/12/2017  . Atrial fibrillation (HCC) 05/13/2017  . Gout, arthritis 05/13/2017  . Osteoarthritis of left knee 05/13/2017  . Morbid obesity (HCC) 05/13/2017  . Chronic anticoagulation 05/13/2017  . History of knee joint replacement 05/13/2017  . Chronic use of opiate drugs therapeutic purposes 05/13/2017  . Anosmia 06/20/2016  . OSA (obstructive sleep apnea) 06/20/2016   Past Medical History:  Diagnosis Date  . Arthritis    knees  . CHF (congestive heart failure) (HCC)     " A long time ago"  . Dysrhythmia    04-21-13 Cardioverted at [facility] for A. Fib-Dr. Jacinto Halim. No further problems  . GI bleed   . Gout   . Hypertension   . Rotator cuff tear, right   . Sleep apnea    cpap nightly, settings 3    Family History  Problem Relation Age of Onset  . Diabetes Mother   . Alzheimer's disease Mother   . Diabetes  Father   . Diabetes Brother   . Stroke Brother   . Stroke Sister     Past Surgical History:  Procedure Laterality Date  . BIOPSY  01/09/2018   Procedure: BIOPSY;  Surgeon: Kathi Der, MD;  Location: [facility];  Service: Gastroenterology;;  . CARDIOVERSION N/A 04/21/2013   Procedure: CARDIOVERSION;  Surgeon: [name], MD;  Location: [facility] ENDOSCOPY;  Service: Cardiovascular;  Laterality: N/A;  h&p in file- HW  . COLONOSCOPY WITH PROPOFOL N/A 09/04/2013   Procedure: COLONOSCOPY WITH PROPOFOL;  Surgeon: Vertell Novak., MD;  Location: [facility] ENDOSCOPY;  Service: Endoscopy;  Laterality: N/A;  . COLONOSCOPY WITH PROPOFOL N/A 06/01/2019   Procedure: COLONOSCOPY WITH PROPOFOL;  Surgeon: Carman Ching, MD;  Location: [facility] ENDOSCOPY;  Service: Endoscopy;  Laterality: N/A;  . ESOPHAGOGASTRODUODENOSCOPY (EGD) WITH PROPOFOL N/A 01/09/2018   Procedure: ESOPHAGOGASTRODUODENOSCOPY (EGD) WITH PROPOFOL;  Surgeon: Kathi Der, MD;  Location: [facility] ENDOSCOPY;  Service: Gastroenterology;  Laterality: N/A;  . FOOT SURGERY Left   . FRACTURE SURGERY    . JOINT REPLACEMENT Left   . ORIF ANKLE FRACTURE Right   . SHOULDER ARTHROSCOPY WITH ROTATOR CUFF REPAIR AND SUBACROMIAL DECOMPRESSION Right 03/20/2018   Procedure: SHOULDER ARTHROSCOPY WITH ROTATOR CUFF REPAIR AND SUBACROMIAL DECOMPRESSION;  Surgeon: [name], MD;  Location: [facility] OR;  Service: Orthopedics;  Laterality: Right;  . TOTAL KNEE ARTHROPLASTY Right 12/23/2019   Procedure: RIGHT TOTAL KNEE ARTHROPLASTY;  Surgeon: Nadara Mustard, MD;  Location: [facility] OR;  Service: Orthopedics;  Laterality: Right;   Social History   Occupational History  . Not on file  Tobacco Use  . Smoking status: Never Smoker  . Smokeless tobacco: Never Used  Vaping Use  . Vaping Use: Never used  Substance and Sexual Activity  . Alcohol use: Yes    Alcohol/week: 10.0 standard drinks    Types: 8 Cans of beer, 2 Shots of liquor per week  . Drug use: No  . Sexual  activity: Not on file

## 2020-02-17 ENCOUNTER — Ambulatory Visit: Payer: Medicare Other

## 2020-02-17 ENCOUNTER — Telehealth: Payer: Self-pay

## 2020-02-17 NOTE — Telephone Encounter (Signed)
Called patient to check up on him. Pt just returned from a plan ride from [place] this morning. Will be ready to resume PT next week. Will be able to come to his next scheduled session. Has been working on his knee and can bend it further.

## 2020-02-18 ENCOUNTER — Ambulatory Visit: Payer: Medicare Other

## 2020-02-24 ENCOUNTER — Ambulatory Visit: Payer: Medicare Other

## 2020-02-24 ENCOUNTER — Other Ambulatory Visit: Payer: Self-pay

## 2020-02-24 DIAGNOSIS — M25561 Pain in right knee: Secondary | ICD-10-CM | POA: Diagnosis not present

## 2020-02-24 DIAGNOSIS — R262 Difficulty in walking, not elsewhere classified: Secondary | ICD-10-CM

## 2020-02-24 DIAGNOSIS — G8929 Other chronic pain: Secondary | ICD-10-CM

## 2020-02-24 NOTE — Therapy (Signed)
[place] [facility] PH[facility]OR[facility] MEDICINE [address] [place], [address] Phone: [phone]   Fax:  [phone]  Physical Therapy Treatment  Patient Details  Name: Leon Thomas MRN: [id number] Date of Birth: 1955-12-20 Referring Provider (PT): Aldean Baker, MD   Encounter Date: 02/24/2020   PT End of Session - 02/24/20 1827    Visit Number 3    Number of Visits 13    Date for PT Re-Evaluation 03/10/20    Authorization Type 3    Authorization Time Period of 10 progress report    PT Start Time 1745    PT Stop Time 1825    PT Time Calculation (min) 40 min    Activity Tolerance Patient tolerated treatment well    Behavior During Therapy [facility] for tasks assessed/performed           Past Medical History:  Diagnosis Date  . Arthritis    knees  . CHF (congestive heart failure) (HCC)     " A long time ago"  . Dysrhythmia    04-21-13 Cardioverted at [facility] for A. Fib-Dr. Jacinto Halim. No further problems  . GI bleed   . Gout   . Hypertension   . Rotator cuff tear, right   . Sleep apnea    cpap nightly, settings 3    Past Surgical History:  Procedure Laterality Date  . BIOPSY  01/09/2018   Procedure: BIOPSY;  Surgeon: Kathi Der, MD;  Location: [facility];  Service: Gastroenterology;;  . CARDIOVERSION N/A 04/21/2013   Procedure: CARDIOVERSION;  Surgeon: [name], MD;  Location: [facility] ENDOSCOPY;  Service: Cardiovascular;  Laterality: N/A;  h&p in file- HW  . COLONOSCOPY WITH PROPOFOL N/A 09/04/2013   Procedure: COLONOSCOPY WITH PROPOFOL;  Surgeon: Vertell Novak., MD;  Location: [facility] ENDOSCOPY;  Service: Endoscopy;  Laterality: N/A;  . COLONOSCOPY WITH PROPOFOL N/A 06/01/2019   Procedure: COLONOSCOPY WITH PROPOFOL;  Surgeon: Carman Ching, MD;  Location: [facility] ENDOSCOPY;  Service: Endoscopy;  Laterality: N/A;  . ESOPHAGOGASTRODUODENOSCOPY (EGD) WITH PROPOFOL N/A 01/09/2018   Procedure: ESOPHAGOGASTRODUODENOSCOPY (EGD) WITH  PROPOFOL;  Surgeon: Kathi Der, MD;  Location: [facility] ENDOSCOPY;  Service: Gastroenterology;  Laterality: N/A;  . FOOT SURGERY Left   . FRACTURE SURGERY    . JOINT REPLACEMENT Left   . ORIF ANKLE FRACTURE Right   . SHOULDER ARTHROSCOPY WITH ROTATOR CUFF REPAIR AND SUBACROMIAL DECOMPRESSION Right 03/20/2018   Procedure: SHOULDER ARTHROSCOPY WITH ROTATOR CUFF REPAIR AND SUBACROMIAL DECOMPRESSION;  Surgeon: [name], MD;  Location: [facility] OR;  Service: Orthopedics;  Laterality: Right;  . TOTAL KNEE ARTHROPLASTY Right 12/23/2019   Procedure: RIGHT TOTAL KNEE ARTHROPLASTY;  Surgeon: Nadara Mustard, MD;  Location: [facility] OR;  Service: Orthopedics;  Laterality: Right;    There were no vitals filed for this visit.   Subjective Assessment - 02/24/20 1746    Subjective Patient has been busy with several funerals.    Pertinent History S/P R TKA on 12/23/2019. Pt is currently 4-5 weeks post surgery. Pt had home health PT after surgery. Pt has been consistent with his exercises. Pt started with a rw, then transitioned to a SPC, and started walking independently last week.   February 10, 2020 is next MD appointment.  No AD prior to surgery but had a lot of pain. Pt currently works as a Curator part time.    Patient Stated Goals Decreased R knee and leg tightness.    Currently in Pain? Yes  Pain Score 3     Pain Location Knee    Pain Orientation Right;Medial    Pain Descriptors / Indicators Tightness    Pain Onset More than a month ago               No latex band allergies Blood pressure is controlled per pt.       Medbridge Access Code: [email or web address]   Manual therapy   Seated STM peri scar tissue area, shin to decrease fascial restrictions   Seated STM vastus lateralis, lateral hamstrings, vastus medialis to decrease muscle tension    Seated STM R IT band      Therapeutic exercise   Supine R knee flexion AAROM 10x3 Supine R knee flexion AROM 5x3 with 2 second holds    Gait with  emphasis in improving knee flexion during swing phase. cued for heel strike and toe push off on RLE  Recumbent bike, pt preferred to pedal backwards, no resistance, seat at 21. Pt able to intermittently able to perform full revolution, but did compensate with R hip hike. X    Improved exercise technique, movement at target joints, use of target muscles after mod verbal, visual, tactile cues.        Response to treatment Pt motivated throughout to improve knee, no increase in pain at end of session.   Clinical impression Continued working on improving fascial mobility from his R knee to his shin to decrease restrictions. Pt able to intermittently able to perform full revolution on recumbent bike, but did compensate with R hip hike. Pt will benefit from continued skilled physical therapy services to improve ROM, strength, function, and ability to ambulate and perform transfers with less difficulty.         PT Education - 02/24/20 1750    Education Details therex    Person(s) Educated Patient    Methods Explanation;Demonstration;Tactile cues;Verbal cues    Comprehension Verbalized understanding;Returned demonstration            PT Short Term Goals - 01/26/20 1559      PT SHORT TERM GOAL #1   Title Patient will be independent with his HEP to improve R knee flexion, extension AROM, R LE strength, improve ability to ambulate and negotiate stairs with less difficulty.    Baseline Pt has started his HEP (01/26/2020)    Time 3    Period Weeks    Status New    Target Date 02/18/20             PT Long Term Goals - 01/26/20 1608      PT LONG TERM GOAL #1   Title Pt will improve R knee flexion AROM to 115 degrees or more to promote ability to perform transfers, squat, and negotiate stairs with less difficulty.    Baseline R knee flexion AROM 91 degrees (01/26/2020)    Time 6    Period Weeks    Status New    Target Date 03/10/20      PT LONG TERM GOAL #2   Title Patient  will improve R knee extension AROM to 0 degrees to promote ability to ambulate as well as perform standing tasks with less difficulty.    Baseline -5 degrees extension AROM R knee (01/26/2020)    Time 6    Period Weeks    Status New    Target Date 03/10/20      PT LONG TERM GOAL #3   Title Patient will improve his R knee  FOTO score by at least 10 points as a demonstration of improved function.    Baseline Knee FOTO 55 (01/26/2020)    Time 6    Period Weeks    Status New    Target Date 03/10/20                 Plan - 02/24/20 1822    Clinical Impression Statement Continued working on improving fascial mobility from his R knee to his shin to decrease restrictions. Pt able to intermittently able to perform full revolution on recumbent bike, but did compensate with R hip hike. Pt will benefit from continued skilled physical therapy services to improve ROM, strength, function, and ability to ambulate and perform transfers with less difficulty.    Personal Factors and Comorbidities Time since onset of injury/illness/exacerbation;Past/Current Experience;Comorbidity 3+    Comorbidities CHF, HTN, gout, arthritis    Examination-Activity Limitations Squat;Stairs;Transfers    Stability/Clinical Decision Making Stable/Uncomplicated    Rehab Potential Good    PT Frequency 2x / week    PT Duration 6 weeks    PT Treatment/Interventions Electrical Stimulation;Iontophoresis 4mg /ml Dexamethasone;Aquatic Therapy;Gait training;Stair training;Functional mobility training;Therapeutic activities;Therapeutic exercise;Balance training;Neuromuscular re-education;Patient/family education;Manual techniques;Dry needling    PT Next Visit Plan knee ROM, gait, strengthening, balance, manual techinques, modalities PRN    PT Home Exercise Plan Medbridge Access Code:    Consulted and Agree with Plan of Care Patient           Patient will benefit from skilled therapeutic intervention in order to improve  the following deficits and impairments:  Pain, Postural dysfunction, Improper body mechanics, Difficulty walking, Decreased strength, Decreased range of motion, Abnormal gait  Visit Diagnosis: Chronic pain of right knee  Difficulty in walking, not elsewhere classified     Problem List Patient Active Problem List   Diagnosis Date Noted  . Arthritis of right knee 12/23/2019  . Chronic gastritis 01/28/2018  . GI bleed 01/07/2018  . Unilateral primary osteoarthritis, right knee 12/12/2017  . Chronic prescription opiate use 12/12/2017  . Chronic pain syndrome 12/12/2017  . Atrial fibrillation (HCC) 05/13/2017  . Gout, arthritis 05/13/2017  . Osteoarthritis of left knee 05/13/2017  . Morbid obesity (HCC) 05/13/2017  . Chronic anticoagulation 05/13/2017  . History of knee joint replacement 05/13/2017  . Chronic use of opiate drugs therapeutic purposes 05/13/2017  . Anosmia 06/20/2016  . OSA (obstructive sleep apnea) 06/20/2016    06/22/2016 PT, DPT 6:29 PM,02/24/20   [place] [facility] PHYSICAL AND SPORTS MEDICINE [address], [address], [place] Phone: [phone]   Fax:  [phone]  Name: ARHAAN CHESNUT MRN: Anabel Halon Date of Birth: June 24, 1956

## 2020-03-01 ENCOUNTER — Ambulatory Visit: Payer: Medicare Other | Attending: Orthopedic Surgery

## 2020-03-01 ENCOUNTER — Other Ambulatory Visit: Payer: Self-pay

## 2020-03-01 DIAGNOSIS — M25561 Pain in right knee: Secondary | ICD-10-CM | POA: Diagnosis present

## 2020-03-01 DIAGNOSIS — G8929 Other chronic pain: Secondary | ICD-10-CM | POA: Insufficient documentation

## 2020-03-01 DIAGNOSIS — R262 Difficulty in walking, not elsewhere classified: Secondary | ICD-10-CM | POA: Insufficient documentation

## 2020-03-01 NOTE — Therapy (Signed)
[place] [facility] PHYS[facility]ORTS MEDICINE [address], [place], [address] Phone: [phone]   Fax:  [phone]  Physical Therapy Treatment  Patient Details  Name: Leon Thomas MRN: [id number] Date of Birth: 1956/03/12 Referring Provider (PT): Aldean Baker, MD   Encounter Date: 03/01/2020   PT End of Session - 03/01/20 1346    Visit Number 4    Number of Visits 13    Date for PT Re-Evaluation 03/10/20    Authorization Type 4    Authorization Time Period of 10 progress report    PT Start Time 1346    PT Stop Time 1431    PT Time Calculation (min) 45 min    Activity Tolerance Patient tolerated treatment well    Behavior During Therapy [facility] for tasks assessed/performed           Past Medical History:  Diagnosis Date  . Arthritis    knees  . CHF (congestive heart failure) (HCC)     " A long time ago"  . Dysrhythmia    04-21-13 Cardioverted at [facility] for A. Fib-Dr. Jacinto Halim. No further problems  . GI bleed   . Gout   . Hypertension   . Rotator cuff tear, right   . Sleep apnea    cpap nightly, settings 3    Past Surgical History:  Procedure Laterality Date  . BIOPSY  01/09/2018   Procedure: BIOPSY;  Surgeon: Kathi Der, MD;  Location: [facility];  Service: Gastroenterology;;  . CARDIOVERSION N/A 04/21/2013   Procedure: CARDIOVERSION;  Surgeon: [name], MD;  Location: [facility] ENDOSCOPY;  Service: Cardiovascular;  Laterality: N/A;  h&p in file- HW  . COLONOSCOPY WITH PROPOFOL N/A 09/04/2013   Procedure: COLONOSCOPY WITH PROPOFOL;  Surgeon: Vertell Novak., MD;  Location: [facility] ENDOSCOPY;  Service: Endoscopy;  Laterality: N/A;  . COLONOSCOPY WITH PROPOFOL N/A 06/01/2019   Procedure: COLONOSCOPY WITH PROPOFOL;  Surgeon: Carman Ching, MD;  Location: [facility] ENDOSCOPY;  Service: Endoscopy;  Laterality: N/A;  . ESOPHAGOGASTRODUODENOSCOPY (EGD) WITH PROPOFOL N/A 01/09/2018   Procedure: ESOPHAGOGASTRODUODENOSCOPY (EGD) WITH  PROPOFOL;  Surgeon: Kathi Der, MD;  Location: [facility] ENDOSCOPY;  Service: Gastroenterology;  Laterality: N/A;  . FOOT SURGERY Left   . FRACTURE SURGERY    . JOINT REPLACEMENT Left   . ORIF ANKLE FRACTURE Right   . SHOULDER ARTHROSCOPY WITH ROTATOR CUFF REPAIR AND SUBACROMIAL DECOMPRESSION Right 03/20/2018   Procedure: SHOULDER ARTHROSCOPY WITH ROTATOR CUFF REPAIR AND SUBACROMIAL DECOMPRESSION;  Surgeon: [name], MD;  Location: [facility] OR;  Service: Orthopedics;  Laterality: Right;  . TOTAL KNEE ARTHROPLASTY Right 12/23/2019   Procedure: RIGHT TOTAL KNEE ARTHROPLASTY;  Surgeon: Nadara Mustard, MD;  Location: [facility] OR;  Service: Orthopedics;  Laterality: Right;    There were no vitals filed for this visit.   Subjective Assessment - 03/01/20 1351    Subjective R knee is doing good. Has been working on the swelling and steps. 3/10 R medial knee pain currently.    Pertinent History S/P R TKA on 12/23/2019. Pt is currently 4-5 weeks post surgery. Pt had home health PT after surgery. Pt has been consistent with his exercises. Pt started with a rw, then transitioned to a SPC, and started walking independently last week.   February 10, 2020 is next MD appointment.  No AD prior to surgery but had a lot of pain. Pt currently works as a Curator part time.    Patient Stated Goals Decreased R knee  and leg tightness.    Currently in Pain? Yes    Pain Score 3     Pain Onset More than a month ago                                     PT Education - 03/01/20 1447    Education Details ther-ex    Person(s) Educated Patient    Methods Explanation;Demonstration;Tactile cues;Verbal cues    Comprehension Returned demonstration;Verbalized understanding          No latex band allergies Blood pressure is controlled per pt.  S/P 10 weeks  MedbridgeAccess Code: [email or web address]  Manual therapy  Seated STMperi scar tissue area, shin to decrease fascial restrictions  Seated STM  medial and lateral R knee  Therapeutic exercise   Nustep seat 10, level 1 for 1 min. Knees too close to handles so stopped  Seated R knee flexion AAROM 10x3 Seated R knee flexion AROM5x3with 2 second holds   100 degrees seated R knee flexion AROM afterwards  Ascending and descending 4 regular steps with B UE assist 1x. Able to perform in a reciprocal pattern   SLS R LE with 2 finger light touch assist 10x5 seconds   Static mini lunge R LE with one UE assist  10x, then regular lunge 5x.   Sit <> stand from low mat table with pillow, no hands 10x  Gait with emphasis in improving knee flexion during swing phase.cued for heel strike and toe push off on RLE 200 ft to help incorporate ROM into daily movement.     Improved exercise technique, movement at target joints, use of target muscles after mod verbal, visual, tactile cues.    Response to treatment Pt motivated throughout to improve knee, no increase in pain at end of session.  Clinical impression Improving soft tissue mobility R knee. Pt able to achieve 100 degrees seated R knee flexion AROM today after manual therapy and exercise. Continued working on R LE strengthening to improve function and decrease difficulty with transfers and gait. Pt tolerated session well without aggravation of symptoms. Pt will benefit from continued skilled physical therapy services to improve AROM, strength and function.       PT Short Term Goals - 01/26/20 1559      PT SHORT TERM GOAL #1   Title Patient will be independent with his HEP to improve R knee flexion, extension AROM, R LE strength, improve ability to ambulate and negotiate stairs with less difficulty.    Baseline Pt has started his HEP (01/26/2020)    Time 3    Period Weeks    Status New    Target Date 02/18/20             PT Long Term Goals - 01/26/20 1608      PT LONG TERM GOAL #1   Title Pt will improve R knee flexion AROM to 115 degrees or more to promote  ability to perform transfers, squat, and negotiate stairs with less difficulty.    Baseline R knee flexion AROM 91 degrees (01/26/2020)    Time 6    Period Weeks    Status New    Target Date 03/10/20      PT LONG TERM GOAL #2   Title Patient will improve R knee extension AROM to 0 degrees to promote ability to ambulate as well as perform standing tasks with less difficulty.  Baseline -5 degrees extension AROM R knee (01/26/2020)    Time 6    Period Weeks    Status New    Target Date 03/10/20      PT LONG TERM GOAL #3   Title Patient will improve his R knee FOTO score by at least 10 points as a demonstration of improved function.    Baseline Knee FOTO 55 (01/26/2020)    Time 6    Period Weeks    Status New    Target Date 03/10/20                 Plan - 03/01/20 1439    Clinical Impression Statement Improving soft tissue mobility R knee. Pt able to achieve 100 degrees seated R knee flexion AROM today after manual therapy and exercise. Continued working on R LE strengthening to improve function and decrease difficulty with transfers and gait. Pt tolerated session well without aggravation of symptoms. Pt will benefit from continued skilled physical therapy services to improve AROM, strength and function.    Personal Factors and Comorbidities Time since onset of injury/illness/exacerbation;Past/Current Experience;Comorbidity 3+    Comorbidities CHF, HTN, gout, arthritis    Examination-Activity Limitations Squat;Stairs;Transfers    Stability/Clinical Decision Making Stable/Uncomplicated    Rehab Potential Good    PT Frequency 2x / week    PT Duration 6 weeks    PT Treatment/Interventions Electrical Stimulation;Iontophoresis 4mg /ml Dexamethasone;Aquatic Therapy;Gait training;Stair training;Functional mobility training;Therapeutic activities;Therapeutic exercise;Balance training;Neuromuscular re-education;Patient/family education;Manual techniques;Dry needling    PT Next Visit Plan  knee ROM, gait, strengthening, balance, manual techinques, modalities PRN    PT Home Exercise Plan Medbridge Access Code:    Consulted and Agree with Plan of Care Patient           Patient will benefit from skilled therapeutic intervention in order to improve the following deficits and impairments:  Pain, Postural dysfunction, Improper body mechanics, Difficulty walking, Decreased strength, Decreased range of motion, Abnormal gait  Visit Diagnosis: Chronic pain of right knee  Difficulty in walking, not elsewhere classified     Problem List Patient Active Problem List   Diagnosis Date Noted  . Arthritis of right knee 12/23/2019  . Chronic gastritis 01/28/2018  . GI bleed 01/07/2018  . Unilateral primary osteoarthritis, right knee 12/12/2017  . Chronic prescription opiate use 12/12/2017  . Chronic pain syndrome 12/12/2017  . Atrial fibrillation (HCC) 05/13/2017  . Gout, arthritis 05/13/2017  . Osteoarthritis of left knee 05/13/2017  . Morbid obesity (HCC) 05/13/2017  . Chronic anticoagulation 05/13/2017  . History of knee joint replacement 05/13/2017  . Chronic use of opiate drugs therapeutic purposes 05/13/2017  . Anosmia 06/20/2016  . OSA (obstructive sleep apnea) 06/20/2016    06/22/2016 PT, DPT   03/01/2020, 2:49 PM  [place] [facility] PHYSICAL AND SPORTS MEDICINE [address], [place] Phone: [phone]   Fax:  [phone]  Name: Leon Thomas MRN: Anabel Halon Date of Birth: 30-Sep-1955

## 2020-03-03 ENCOUNTER — Ambulatory Visit: Payer: Medicare Other

## 2020-03-03 ENCOUNTER — Other Ambulatory Visit: Payer: Self-pay

## 2020-03-03 DIAGNOSIS — R262 Difficulty in walking, not elsewhere classified: Secondary | ICD-10-CM

## 2020-03-03 DIAGNOSIS — M25561 Pain in right knee: Secondary | ICD-10-CM | POA: Diagnosis not present

## 2020-03-03 DIAGNOSIS — G8929 Other chronic pain: Secondary | ICD-10-CM

## 2020-03-03 NOTE — Therapy (Signed)
[place] [facility] [address], [place], [address] Phone: [phone]   Fax:  [phone]  Physical Therapy Treatment  Patient Details  Name: Leon Thomas MRN: [id number] Date of Birth: 10/19/1955 Referring Provider (PT): Aldean Baker, MD   Encounter Date: 03/03/2020   PT End of Session - 03/03/20 1433    Visit Number 5    Number of Visits 13    Date for PT Re-Evaluation 03/10/20    Authorization Type 5    Authorization Time Period of 10 progress report    PT Start Time 1433    PT Stop Time 1516    PT Time Calculation (min) 43 min    Activity Tolerance Patient tolerated treatment well    Behavior During Therapy [facility] for tasks assessed/performed           Past Medical History:  Diagnosis Date  . Arthritis    knees  . CHF (congestive heart failure) (HCC)     " A long time ago"  . Dysrhythmia    04-21-13 Cardioverted at [facility] for A. Fib-Dr. Jacinto Halim. No further problems  . GI bleed   . Gout   . Hypertension   . Rotator cuff tear, right   . Sleep apnea    cpap nightly, settings 3    Past Surgical History:  Procedure Laterality Date  . BIOPSY  01/09/2018   Procedure: BIOPSY;  Surgeon: Kathi Der, MD;  Location: [facility]OSCOPY;  Service: Gastroenterology;;  . CARDIOVERSION N/A 04/21/2013   Procedure: CARDIOVERSION;  Surgeon: [name], MD;  Location: [facility] ENDOSCOPY;  Service: Cardiovascular;  Laterality: N/A;  h&p in file- HW  . COLONOSCOPY WITH PROPOFOL N/A 09/04/2013   Procedure: COLONOSCOPY WITH PROPOFOL;  Surgeon: Vertell Novak., MD;  Location: [facility] ENDOSCOPY;  Service: Endoscopy;  Laterality: N/A;  . COLONOSCOPY WITH PROPOFOL N/A 06/01/2019   Procedure: COLONOSCOPY WITH PROPOFOL;  Surgeon: Carman Ching, MD;  Location: [facility] ENDOSCOPY;  Service: Endoscopy;  Laterality: N/A;  . ESOPHAGOGASTRODUODENOSCOPY (EGD) WITH PROPOFOL N/A 01/09/2018   Procedure: ESOPHAGOGASTRODUODENOSCOPY (EGD) WITH  PROPOFOL;  Surgeon: Kathi Der, MD;  Location: [facility] ENDOSCOPY;  Service: Gastroenterology;  Laterality: N/A;  . FOOT SURGERY Left   . FRACTURE SURGERY    . JOINT REPLACEMENT Left   . ORIF ANKLE FRACTURE Right   . SHOULDER ARTHROSCOPY WITH ROTATOR CUFF REPAIR AND SUBACROMIAL DECOMPRESSION Right 03/20/2018   Procedure: SHOULDER ARTHROSCOPY WITH ROTATOR CUFF REPAIR AND SUBACROMIAL DECOMPRESSION;  Surgeon: [name], MD;  Location: [facility] OR;  Service: Orthopedics;  Laterality: Right;  . TOTAL KNEE ARTHROPLASTY Right 12/23/2019   Procedure: RIGHT TOTAL KNEE ARTHROPLASTY;  Surgeon: Nadara Mustard, MD;  Location: [facility] OR;  Service: Orthopedics;  Laterality: Right;    There were no vitals filed for this visit.   Subjective Assessment - 03/03/20 1435    Subjective R knee doing alright. Worked late last night. 3/10 R knee pain currently. Tightness. Walking is getting real real good. Working on getting up and down.    Pertinent History S/P R TKA on 12/23/2019. Pt is currently 4-5 weeks post surgery. Pt had home health PT after surgery. Pt has been consistent with his exercises. Pt started with a rw, then transitioned to a SPC, and started walking independently last week.   February 10, 2020 is next MD appointment.  No AD prior to surgery but had a lot of pain. Pt currently works as a Curator part time.  Patient Stated Goals Decreased R knee and leg tightness.    Currently in Pain? Yes    Pain Score 3     Pain Onset More than a month ago                                     PT Education - 03/03/20 1952    Education Details ther-ex, manual therapy    Person(s) Educated Patient    Methods Explanation;Demonstration;Tactile cues;Verbal cues    Comprehension Returned demonstration;Verbalized understanding          No latex band allergies Blood pressure is controlled per pt.  S/P 10 weeks  MedbridgeAccess Code: [email or web address]  Manual therapy  Seated STMperi scar  tissue area, shin to decrease fascial restrictions  Seated STM medial and lateral R knee  Muscle energy technique to promote knee flexion ROM  Improved R knee flexion AROM to 102 degrees in sitting afterwards   Therapeutic exercise  SeatedR knee flexionAAROM 10x3 SeatedR knee flexion AROM5x3with2second holds    Sit <> stand from low mat table without pillow, no hands 10x  Able to perform.    Improved exercise technique, movement at target joints, use of target muscles after mod verbal, visual, tactile cues.    Response to treatment Pt motivated throughout to improve knee, no increase in pain at end of session.  Clinical impression Improved R knee AROM after muscle energy technique and STM. Continued working on improving knee ROM to decrease stiffness as well as general LE strength to improve function. Pt able to perform independent sit <> stand transfers a low mat table/chair height without UE assist. Pt will benefit from continued skilled physical therapy services to decrease stiffness, improve strength and function.            PT Short Term Goals - 01/26/20 1559      PT SHORT TERM GOAL #1   Title Patient will be independent with his HEP to improve R knee flexion, extension AROM, R LE strength, improve ability to ambulate and negotiate stairs with less difficulty.    Baseline Pt has started his HEP (01/26/2020)    Time 3    Period Weeks    Status New    Target Date 02/18/20             PT Long Term Goals - 01/26/20 1608      PT LONG TERM GOAL #1   Title Pt will improve R knee flexion AROM to 115 degrees or more to promote ability to perform transfers, squat, and negotiate stairs with less difficulty.    Baseline R knee flexion AROM 91 degrees (01/26/2020)    Time 6    Period Weeks    Status New    Target Date 03/10/20      PT LONG TERM GOAL #2   Title Patient will improve R knee extension AROM to 0 degrees to promote ability to ambulate  as well as perform standing tasks with less difficulty.    Baseline -5 degrees extension AROM R knee (01/26/2020)    Time 6    Period Weeks    Status New    Target Date 03/10/20      PT LONG TERM GOAL #3   Title Patient will improve his R knee FOTO score by at least 10 points as a demonstration of improved function.    Baseline Knee FOTO 55 (01/26/2020)  Time 6    Period Weeks    Status New    Target Date 03/10/20                 Plan - 03/03/20 1953    Clinical Impression Statement Improved R knee AROM after muscle energy technique and STM. Continued working on improving knee ROM to decrease stiffness as well as general LE strength to improve function. Pt able to perform independent sit <> stand transfers a low mat table/chair height without UE assist. Pt will benefit from continued skilled physical therapy services to decrease stiffness, improve strength and function.    Personal Factors and Comorbidities Time since onset of injury/illness/exacerbation;Past/Current Experience;Comorbidity 3+    Comorbidities CHF, HTN, gout, arthritis    Examination-Activity Limitations Squat;Stairs;Transfers    Stability/Clinical Decision Making Stable/Uncomplicated    Rehab Potential Good    PT Frequency 2x / week    PT Duration 6 weeks    PT Treatment/Interventions Electrical Stimulation;Iontophoresis 4mg /ml Dexamethasone;Aquatic Therapy;Gait training;Stair training;Functional mobility training;Therapeutic activities;Therapeutic exercise;Balance training;Neuromuscular re-education;Patient/family education;Manual techniques;Dry needling    PT Next Visit Plan knee ROM, gait, strengthening, balance, manual techinques, modalities PRN    PT Home Exercise Plan Medbridge Access Code:    Consulted and Agree with Plan of Care Patient           Patient will benefit from skilled therapeutic intervention in order to improve the following deficits and impairments:  Pain, Postural dysfunction,  Improper body mechanics, Difficulty walking, Decreased strength, Decreased range of motion, Abnormal gait  Visit Diagnosis: Chronic pain of right knee  Difficulty in walking, not elsewhere classified     Problem List Patient Active Problem List   Diagnosis Date Noted  . Arthritis of right knee 12/23/2019  . Chronic gastritis 01/28/2018  . GI bleed 01/07/2018  . Unilateral primary osteoarthritis, right knee 12/12/2017  . Chronic prescription opiate use 12/12/2017  . Chronic pain syndrome 12/12/2017  . Atrial fibrillation (HCC) 05/13/2017  . Gout, arthritis 05/13/2017  . Osteoarthritis of left knee 05/13/2017  . Morbid obesity (HCC) 05/13/2017  . Chronic anticoagulation 05/13/2017  . History of knee joint replacement 05/13/2017  . Chronic use of opiate drugs therapeutic purposes 05/13/2017  . Anosmia 06/20/2016  . OSA (obstructive sleep apnea) 06/20/2016    Emmery Seiler 03/03/2020, 7:57 PM  [place]  [facility] [address], [place] Phone: [phone]   Fax:  [phone]  Name: AIVEN KAMPE MRN: Anabel Halon Date of Birth: 03/12/1956

## 2020-03-08 ENCOUNTER — Other Ambulatory Visit: Payer: Self-pay

## 2020-03-08 ENCOUNTER — Ambulatory Visit: Payer: Medicare Other

## 2020-03-08 DIAGNOSIS — R262 Difficulty in walking, not elsewhere classified: Secondary | ICD-10-CM

## 2020-03-08 DIAGNOSIS — M25561 Pain in right knee: Secondary | ICD-10-CM | POA: Diagnosis not present

## 2020-03-08 DIAGNOSIS — G8929 Other chronic pain: Secondary | ICD-10-CM

## 2020-03-08 NOTE — Therapy (Signed)
[place] [facility] [address] [place], [address] Phone: [phone]   Fax:  [phone]  Physical Therapy Treatment  Patient Details  Name: Leon Thomas MRN: [id number] Date of Birth: [birth year] Referring Provider (PT): Aldean Baker, MD   Encounter Date: 03/08/2020   PT End of Session - 03/08/20 1356    Visit Number 6    Number of Visits 13    Date for PT Re-Evaluation 03/10/20    Authorization Type 6    Authorization Time Period of 10 progress report    PT Start Time 1346    PT Stop Time 1426    PT Time Calculation (min) 40 min    Equipment Utilized During Treatment Gait belt    Activity Tolerance Patient tolerated treatment well;No increased pain    Behavior During Therapy WFL for tasks assessed/performed           Past Medical History:  Diagnosis Date  . Arthritis    knees  . CHF (congestive heart failure) (HCC)     " A long time ago"  . Dysrhythmia    04-21-13 Cardioverted at [facility] for A. Fib-Dr. Jacinto Halim. No further problems  . GI bleed   . Gout   . Hypertension   . Rotator cuff tear, right   . Sleep apnea    cpap nightly, settings 3    Past Surgical History:  Procedure Laterality Date  . BIOPSY  01/09/2018   Procedure: BIOPSY;  Surgeon: Kathi Der, MD;  Location: [facility]COPY;  Service: Gastroenterology;;  . CARDIOVERSION N/A 04/21/2013   Procedure: CARDIOVERSION;  Surgeon: [name], MD;  Location: [facility] ENDOSCOPY;  Service: Cardiovascular;  Laterality: N/A;  h&p in file- HW  . COLONOSCOPY WITH PROPOFOL N/A 09/04/2013   Procedure: COLONOSCOPY WITH PROPOFOL;  Surgeon: Vertell Novak., MD;  Location: [facility] ENDOSCOPY;  Service: Endoscopy;  Laterality: N/A;  . COLONOSCOPY WITH PROPOFOL N/A 06/01/2019   Procedure: COLONOSCOPY WITH PROPOFOL;  Surgeon: Carman Ching, MD;  Location: [facility] ENDOSCOPY;  Service: Endoscopy;  Laterality: N/A;  . ESOPHAGOGASTRODUODENOSCOPY (EGD) WITH  PROPOFOL N/A 01/09/2018   Procedure: ESOPHAGOGASTRODUODENOSCOPY (EGD) WITH PROPOFOL;  Surgeon: Kathi Der, MD;  Location: [facility] ENDOSCOPY;  Service: Gastroenterology;  Laterality: N/A;  . FOOT SURGERY Left   . FRACTURE SURGERY    . JOINT REPLACEMENT Left   . ORIF ANKLE FRACTURE Right   . SHOULDER ARTHROSCOPY WITH ROTATOR CUFF REPAIR AND SUBACROMIAL DECOMPRESSION Right 03/20/2018   Procedure: SHOULDER ARTHROSCOPY WITH ROTATOR CUFF REPAIR AND SUBACROMIAL DECOMPRESSION;  Surgeon: [name], MD;  Location: [facility] OR;  Service: Orthopedics;  Laterality: Right;  . TOTAL KNEE ARTHROPLASTY Right 12/23/2019   Procedure: RIGHT TOTAL KNEE ARTHROPLASTY;  Surgeon: Nadara Mustard, MD;  [facility] OR;  Service: Orthopedics;  Laterality: Right;    There were no vitals filed for this visit.   Subjective Assessment - 03/08/20 1351    Subjective Pt doing ok this date, denies any update. Saw his PCP who is going to put him on Rx strenth Vit D3 supplement. Reports driving down to [place] over the weekend, also had increased swelling in th eknee, now resolved.    Pertinent History S/P R TKA on 12/23/2019. Pt is currently 4-5 weeks post surgery. Pt had home health PT after surgery. Pt has been consistent with his exercises. Pt started with a rw, then transitioned to a SPC, and started walking independently last week.   February 10, 2020 is  next MD appointment.  No AD prior to surgery but had a lot of pain. Pt currently works as a Curator part time.    Patient Stated Goals Decreased R knee and leg tightness.    Currently in Pain? Yes    Pain Score 4     Pain Location Knee    Pain Orientation Right    Pain Descriptors / Indicators Aching    Pain Type Surgical pain          ASSESSMENT: -Rt knee flexion P/ROM: 17-104 degrees  INTERVENTION  -AA/ROM Nustep x5 minute, seat 13 (no arms) level 1 -Standing Hamstrings stretch 3x30sec -lunges knee flexion stretch on 2nd step 3x30sec  -STS from chair+2 airex pads  1x10 -STS from chair+1 airex pad 1x10 -Seated LAQ 1x10 (no resistance)  -Standing Rt knee flexion c 3lb AW 1x15 -Standing Rt knee flexion c 5lb AW 1x15 -Hooklying Rt SLR 1x15 -Hooklying Rt SAQ c 3lb AW 1x10  -Hooklying Rt SAQ c 5lb AW 1x15 -Hooklying Bridge 1x10; tolerated range, care to avoid Left hip exacerbation    PT Short Term Goals - 01/26/20 1559      PT SHORT TERM GOAL #1   Title Patient will be independent with his HEP to improve R knee flexion, extension AROM, R LE strength, improve ability to ambulate and negotiate stairs with less difficulty.    Baseline Pt has started his HEP (01/26/2020)    Time 3    Period Weeks    Status New    Target Date 02/18/20             PT Long Term Goals - 01/26/20 1608      PT LONG TERM GOAL #1   Title Pt will improve R knee flexion AROM to 115 degrees or more to promote ability to perform transfers, squat, and negotiate stairs with less difficulty.    Baseline R knee flexion AROM 91 degrees (01/26/2020)    Time 6    Period Weeks    Status New    Target Date 03/10/20      PT LONG TERM GOAL #2   Title Patient will improve R knee extension AROM to 0 degrees to promote ability to ambulate as well as perform standing tasks with less difficulty.    Baseline -5 degrees extension AROM R knee (01/26/2020)    Time 6    Period Weeks    Status New    Target Date 03/10/20      PT LONG TERM GOAL #3   Title Patient will improve his R knee FOTO score by at least 10 points as a demonstration of improved function.    Baseline Knee FOTO 55 (01/26/2020)    Time 6    Period Weeks    Status New    Target Date 03/10/20                 Plan - 03/08/20 1357    Clinical Impression Statement Knee remains stiff, pt motivated. Continued to progress ROM and muscle activation. Pt able to complete entire session as planned with rest breaks provided as needed. Pt maintains high level of focus and motivation. Extensive verbal, visual, and tactile cues  are provided for most accurate form possible. Overall pt continues to make steady progress toward treatment goals.    Personal Factors and Comorbidities Time since onset of injury/illness/exacerbation;Past/Current Experience;Comorbidity 3+    Comorbidities CHF, HTN, gout, arthritis    Examination-Activity Limitations Squat;Stairs;Transfers    Stability/Clinical Decision  Making Stable/Uncomplicated    Clinical Decision Making Low    Rehab Potential Good    PT Frequency 2x / week    PT Duration 6 weeks    PT Treatment/Interventions Electrical Stimulation;Iontophoresis 4mg /ml Dexamethasone;Aquatic Therapy;Gait training;Stair training;Functional mobility training;Therapeutic activities;Therapeutic exercise;Balance training;Neuromuscular re-education;Patient/family education;Manual techniques;Dry needling    PT Next Visit Plan knee ROM, gait, strengthening, balance, manual techinques, modalities PRN    PT Home Exercise Plan Medbridge Access Code:    Consulted and Agree with Plan of Care Patient           Patient will benefit from skilled therapeutic intervention in order to improve the following deficits and impairments:  Pain, Postural dysfunction, Improper body mechanics, Difficulty walking, Decreased strength, Decreased range of motion, Abnormal gait  Visit Diagnosis: Chronic pain of right knee  Difficulty in walking, not elsewhere classified     Problem List Patient Active Problem List   Diagnosis Date Noted  . Arthritis of right knee 12/23/2019  . Chronic gastritis 01/28/2018  . GI bleed 01/07/2018  . Unilateral primary osteoarthritis, right knee 12/12/2017  . Chronic prescription opiate use 12/12/2017  . Chronic pain syndrome 12/12/2017  . Atrial fibrillation (HCC) 05/13/2017  . Gout, arthritis 05/13/2017  . Osteoarthritis of left knee 05/13/2017  . Morbid obesity (HCC) 05/13/2017  . Chronic anticoagulation 05/13/2017  . History of knee joint replacement 05/13/2017   . Chronic use of opiate drugs therapeutic purposes 05/13/2017  . Anosmia 06/20/2016  . OSA (obstructive sleep apnea) 06/20/2016   2:28 PM, 03/08/20 05/08/20, PT, DPT Physical Therapist - [place] [phone] (Office)    [phone] 03/08/2020, 2:28 PM  [place] [facility] SPORTS MEDICINE [address], [place] Phone: [phone]   Fax:  [phone]  Name: Leon Thomas MRN: Anabel Halon Date of Birth: 10-Jul-1956

## 2020-03-10 ENCOUNTER — Ambulatory Visit: Payer: Medicare Other

## 2020-03-10 ENCOUNTER — Other Ambulatory Visit: Payer: Self-pay

## 2020-03-10 DIAGNOSIS — M25561 Pain in right knee: Secondary | ICD-10-CM

## 2020-03-10 DIAGNOSIS — R262 Difficulty in walking, not elsewhere classified: Secondary | ICD-10-CM

## 2020-03-10 DIAGNOSIS — G8929 Other chronic pain: Secondary | ICD-10-CM

## 2020-03-10 NOTE — Therapy (Signed)
[facility] PHYSICAL AND SPORTS MEDICINE [address], [place], [address] Phone: [phone]   Fax:  [phone]  Physical Therapy Treatment  Patient Details  Name: Leon Thomas MRN: [id number] Date of Birth: 03-11-1956 Referring Provider (PT): Aldean Baker, MD   Encounter Date: 03/10/2020   PT End of Session - 03/10/20 1348    Visit Number 7    Number of Visits 13    Date for PT Re-Evaluation 03/10/20    Authorization Type 7    Authorization Time Period of 10 progress report    PT Start Time 1348    PT Stop Time 1429    PT Time Calculation (min) 41 min    Equipment Utilized During Treatment --    Activity Tolerance Patient tolerated treatment well;No increased pain    Behavior During Therapy WFL for tasks assessed/performed           Past Medical History:  Diagnosis Date  . Arthritis    knees  . CHF (congestive heart failure) (HCC)     " A long time ago"  . Dysrhythmia    04-21-13 Cardioverted at [facility] for A. Fib-Dr. Jacinto Halim. No further problems  . GI bleed   . Gout   . Hypertension   . Rotator cuff tear, right   . Sleep apnea    cpap nightly, settings 3    Past Surgical History:  Procedure Laterality Date  . BIOPSY  01/09/2018   Procedure: BIOPSY;  Surgeon: Kathi Der, MD;  Location: [facility];  Service: Gastroenterology;;  . CARDIOVERSION N/A 04/21/2013   Procedure: CARDIOVERSION;  Surgeon: [name], MD;  Location: [facility] ENDOSCOPY;  Service: Cardiovascular;  Laterality: N/A;  h&p in file- HW  . COLONOSCOPY WITH PROPOFOL N/A 09/04/2013   Procedure: COLONOSCOPY WITH PROPOFOL;  Surgeon: Vertell Novak., MD;  Location: [facility] ENDOSCOPY;  Service: Endoscopy;  Laterality: N/A;  . COLONOSCOPY WITH PROPOFOL N/A 06/01/2019   Procedure: COLONOSCOPY WITH PROPOFOL;  Surgeon: Carman Ching, MD;  Location: [facility] ENDOSCOPY;  Service: Endoscopy;  Laterality: N/A;  . ESOPHAGOGASTRODUODENOSCOPY (EGD) WITH PROPOFOL N/A  01/09/2018   Procedure: ESOPHAGOGASTRODUODENOSCOPY (EGD) WITH PROPOFOL;  Surgeon: Kathi Der, MD;  Location: [facility] ENDOSCOPY;  Service: Gastroenterology;  Laterality: N/A;  . FOOT SURGERY Left   . FRACTURE SURGERY    . JOINT REPLACEMENT Left   . ORIF ANKLE FRACTURE Right   . SHOULDER ARTHROSCOPY WITH ROTATOR CUFF REPAIR AND SUBACROMIAL DECOMPRESSION Right 03/20/2018   Procedure: SHOULDER ARTHROSCOPY WITH ROTATOR CUFF REPAIR AND SUBACROMIAL DECOMPRESSION;  Surgeon: [name], MD;  Location: [facility] OR;  Service: Orthopedics;  Laterality: Right;  . TOTAL KNEE ARTHROPLASTY Right 12/23/2019   Procedure: RIGHT TOTAL KNEE ARTHROPLASTY;  Surgeon: Nadara Mustard, MD;  Location: [facility] OR;  Service: Orthopedics;  Laterality: Right;    There were no vitals filed for this visit.   Subjective Assessment - 03/10/20 1349    Subjective R leg got scraped at work (has bandaids).  Rknee feels alright.    Pertinent History S/P R TKA on 12/23/2019. Pt is currently 4-5 weeks post surgery. Pt had home health PT after surgery. Pt has been consistent with his exercises. Pt started with a rw, then transitioned to a SPC, and started walking independently last week.   February 10, 2020 is next MD appointment.  No AD prior to surgery but had a lot of pain. Pt currently works as a Curator part time.    Patient Stated Goals  Decreased R knee and leg tightness.    Currently in Pain? Yes    Pain Score 3                                     No latex band allergies Blood pressure is controlled per pt.  S/P 11 weeks  MedbridgeAccess Code: [email or web address]   Therapeutic exercise  seated hip adduction pillow squeeze 10x3 with 5 second holds  Sit <> stand from low mat table without pillow, no hands 3x. L hip discomfort  Standing mini lunge R LE with one UE assist 5x3  SLS with L UE assist  R 10x5 seconds   Standing glute max squeeze 10x10 seconds  Decreased back pain  Forward step up onto  4 inch step with B UE assist   R 10x2  Seated knee flexion and extension AAROM stretch with large medicine ball sitting on low mat table   R 10x5 seconds each way for 2 sets   Seated R knee flexion AROM after session 100 degrees.      Response to treatment Good muscle use felt with exercises. Pt tolerated session well without aggravation of symptoms.    Clinical impression Worked on closed chain R LE strength to promote ability to perform transfers, and curb negotiations with less difficulty. Also continued working on knee flexion ROM to decrease stiffness. 100 degrees seated R knee flexion AROM after session. Pt will benefit from continued skilled physical therapy services to improve ROM, strength and function.      PT Short Term Goals - 01/26/20 1559      PT SHORT TERM GOAL #1   Title Patient will be independent with his HEP to improve R knee flexion, extension AROM, R LE strength, improve ability to ambulate and negotiate stairs with less difficulty.    Baseline Pt has started his HEP (01/26/2020)    Time 3    Period Weeks    Status New    Target Date 02/18/20             PT Long Term Goals - 01/26/20 1608      PT LONG TERM GOAL #1   Title Pt will improve R knee flexion AROM to 115 degrees or more to promote ability to perform transfers, squat, and negotiate stairs with less difficulty.    Baseline R knee flexion AROM 91 degrees (01/26/2020)    Time 6    Period Weeks    Status New    Target Date 03/10/20      PT LONG TERM GOAL #2   Title Patient will improve R knee extension AROM to 0 degrees to promote ability to ambulate as well as perform standing tasks with less difficulty.    Baseline -5 degrees extension AROM R knee (01/26/2020)    Time 6    Period Weeks    Status New    Target Date 03/10/20      PT LONG TERM GOAL #3   Title Patient will improve his R knee FOTO score by at least 10 points as a demonstration of improved function.    Baseline Knee FOTO 55  (01/26/2020)    Time 6    Period Weeks    Status New    Target Date 03/10/20                 Plan - 03/10/20 1415    Clinical Impression  Statement Worked on closed chain R LE strength to promote ability to perform transfers, and curb negotiations with less difficulty. Also continued working on knee flexion ROM to decrease stiffness. 100 degrees seated R knee flexion AROM after session. Pt will benefit from continued skilled physical therapy services to improve ROM, strength and function.    Personal Factors and Comorbidities Time since onset of injury/illness/exacerbation;Past/Current Experience;Comorbidity 3+    Comorbidities CHF, HTN, gout, arthritis    Examination-Activity Limitations Squat;Stairs;Transfers    Stability/Clinical Decision Making Stable/Uncomplicated    Rehab Potential Good    PT Frequency 2x / week    PT Duration 6 weeks    PT Treatment/Interventions Electrical Stimulation;Iontophoresis 4mg /ml Dexamethasone;Aquatic Therapy;Gait training;Stair training;Functional mobility training;Therapeutic activities;Therapeutic exercise;Balance training;Neuromuscular re-education;Patient/family education;Manual techniques;Dry needling    PT Next Visit Plan knee ROM, gait, strengthening, balance, manual techinques, modalities PRN    PT Home Exercise Plan Medbridge Access Code:    Consulted and Agree with Plan of Care Patient           Patient will benefit from skilled therapeutic intervention in order to improve the following deficits and impairments:  Pain, Postural dysfunction, Improper body mechanics, Difficulty walking, Decreased strength, Decreased range of motion, Abnormal gait  Visit Diagnosis: Chronic pain of right knee  Difficulty in walking, not elsewhere classified     Problem List Patient Active Problem List   Diagnosis Date Noted  . Arthritis of right knee 12/23/2019  . Chronic gastritis 01/28/2018  . GI bleed 01/07/2018  . Unilateral primary  osteoarthritis, right knee 12/12/2017  . Chronic prescription opiate use 12/12/2017  . Chronic pain syndrome 12/12/2017  . Atrial fibrillation (HCC) 05/13/2017  . Gout, arthritis 05/13/2017  . Osteoarthritis of left knee 05/13/2017  . Morbid obesity (HCC) 05/13/2017  . Chronic anticoagulation 05/13/2017  . History of knee joint replacement 05/13/2017  . Chronic use of opiate drugs therapeutic purposes 05/13/2017  . Anosmia 06/20/2016  . OSA (obstructive sleep apnea) 06/20/2016    06/22/2016 PT, DPT   03/10/2020, 2:45 PM  [facility] PHYSICAL AND SPORTS MEDICINE [address], [address], [place] Phone: [phone]   Fax:  [phone]  Name: Leon Thomas MRN: Anabel Halon Date of Birth: [birth year]

## 2020-03-15 ENCOUNTER — Ambulatory Visit: Payer: Medicare Other

## 2020-03-17 ENCOUNTER — Ambulatory Visit: Payer: Medicare Other

## 2020-03-22 ENCOUNTER — Ambulatory Visit: Payer: Medicare Other

## 2020-03-22 ENCOUNTER — Telehealth: Payer: Self-pay

## 2020-03-22 DIAGNOSIS — M25561 Pain in right knee: Secondary | ICD-10-CM

## 2020-03-22 DIAGNOSIS — R262 Difficulty in walking, not elsewhere classified: Secondary | ICD-10-CM

## 2020-03-22 DIAGNOSIS — G8929 Other chronic pain: Secondary | ICD-10-CM

## 2020-03-22 NOTE — Telephone Encounter (Signed)
No show. Called patient that he has to go back to work and stop PT secondary to financial reasons. Wants to discharge and he will continue his progress with his home exercise program. Has been doing his exercises. Able to go up and down stairs in a reciprocal pattern, no UE assist.

## 2020-03-22 NOTE — Therapy (Signed)
[facility]TS [facility]ICINE [address] [place], [place], [address] Phone: [phone]   Fax:  [phone]  Physical Therapy  Discharge Summary  Patient Details  Name: Leon Thomas MRN: [id number] Date of Birth: 06/30/1956 Referring Provider (PT): Marcus Duda, MD   Encounter Date: 03/22/2020    Past Medical History:  Diagnosis Date  . Arthritis    knees  . CHF (congestive heart failure) (HCC)     " A long time ago"  . Dysrhythmia    04-21-13 Cardioverted at [place] for A. Fib-Dr. Ganji. No further problems  . GI bleed   . Gout   . Hypertension   . Rotator cuff tear, right   . Sleep apnea    cpap nightly, settings 3    Past Surgical History:  Procedure Laterality Date  . BIOPSY  01/09/2018   Procedure: BIOPSY;  Surgeon: Brahmbhatt, Parag, MD;  Location: [facility] ENDOSCOPY;  Service: Gastroenterology;;  . CARDIOVERSION N/A 04/21/2013   Procedure: CARDIOVERSION;  Surgeon: Jagadeesh R Ganji, MD;  Location: [facility] ENDOSCOPY;  Service: Cardiovascular;  Laterality: N/A;  h&p in file- HW  . COLONOSCOPY WITH PROPOFOL N/A 09/04/2013   Procedure: COLONOSCOPY WITH PROPOFOL;  Surgeon: James L Edwards Jr., MD;  Location: [facility] ENDOSCOPY;  Service: Endoscopy;  Laterality: N/A;  . COLONOSCOPY WITH PROPOFOL N/A 06/01/2019   Procedure: COLONOSCOPY WITH PROPOFOL;  Surgeon: Edwards, James, MD;  Location: [facility] ENDOSCOPY;  Service: Endoscopy;  Laterality: N/A;  . ESOPHAGOGASTRODUODENOSCOPY (EGD) WITH PROPOFOL N/A 01/09/2018   Procedure: ESOPHAGOGASTRODUODENOSCOPY (EGD) WITH PROPOFOL;  Surgeon: Brahmbhatt, Parag, MD;  Location: [facility]OSCOPY;  Service: Gastroenterology;  Laterality: N/A;  . FOOT SURGERY Left   . FRACTURE SURGERY    . JOINT REPLACEMENT Left   . ORIF ANKLE FRACTURE Right   . SHOULDER ARTHROSCOPY WITH ROTATOR CUFF REPAIR AND SUBACROMIAL DECOMPRESSION Right 03/20/2018   Procedure: SHOULDER ARTHROSCOPY WITH ROTATOR CUFF REPAIR AND SUBACROMIAL  DECOMPRESSION;  Surgeon: Chandler, Justin, MD;  Location: [facility] OR;  Service: Orthopedics;  Laterality: Right;  . TOTAL KNEE ARTHROPLASTY Right 12/23/2019   Procedure: RIGHT TOTAL KNEE ARTHROPLASTY;  Surgeon: Duda, Marcus V, MD;  Location: [facility] OR;  Service: Orthopedics;  Laterality: Right;    There were no vitals filed for this visit.                                     PT Short Term Goals - 03/22/20 1710      PT SHORT TERM GOAL #1   Title Patient will be independent with his HEP to improve R knee flexion, extension AROM, R LE strength, improve ability to ambulate and negotiate stairs with less difficulty.    Baseline Pt has started his HEP (01/26/2020); Pt demonstrates independence and compliance with his HEP based on phone conversation (03/22/2020)    Time 3    Period Weeks    Status Achieved    Target Date 02/18/20             PT Long Term Goals - 03/22/20 1711      PT LONG TERM GOAL #1   Title Pt will improve R knee flexion AROM to 115 degrees or more to promote ability to perform transfers, squat, and negotiate stairs with less difficulty.    Baseline R knee flexion AROM 91 degrees (01/26/2020); 100 degrees R knee flexion (03/10/2020)    Time 6      Period Weeks    Status Partially Met    Target Date 03/10/20      PT LONG TERM GOAL #2   Title Patient will improve R knee extension AROM to 0 degrees to promote ability to ambulate as well as perform standing tasks with less difficulty.    Baseline -5 degrees extension AROM R knee (01/26/2020)    Time 6    Period Weeks    Status Unable to assess    Target Date 03/10/20      PT LONG TERM GOAL #3   Title Patient will improve his R knee FOTO score by at least 10 points as a demonstration of improved function.    Baseline Knee FOTO 55 (01/26/2020)    Time 6    Period Weeks    Status Unable to assess    Target Date 03/10/20                 Plan - 03/22/20 1709    Clinical Impression Statement  Called patient today secondary to missed visit. Pt states that he had to return to work secondary financial reasons. Wants to discharge and continue his progress with his exercises at home. Has been doing his exercises regularly and is currently able to ascend and descend stairs in a reciprocal pattern without UE assist. Skilled physical therapy services discharged secondary to pt request and pt to continue with his exercises at home.    Personal Factors and Comorbidities Time since onset of injury/illness/exacerbation;Past/Current Experience;Comorbidity 3+    Comorbidities CHF, HTN, gout, arthritis    Examination-Activity Limitations --    Stability/Clinical Decision Making Stable/Uncomplicated    Clinical Decision Making Low    Rehab Potential Good    PT Frequency --    PT Duration --    PT Treatment/Interventions Gait training;Functional mobility training;Therapeutic activities;Therapeutic exercise;Neuromuscular re-education;Patient/family education;Manual techniques    PT Next Visit Plan Continue progress with his exercises at home.    PT Home Exercise Plan Medbridge Access Code: [email or web address]    Consulted and Agree with Plan of Care Patient           Patient will benefit from skilled therapeutic intervention in order to improve the following deficits and impairments:  Improper body mechanics, Difficulty walking, Decreased strength, Decreased range of motion, Abnormal gait  Visit Diagnosis: Chronic pain of right knee  Difficulty in walking, not elsewhere classified     Problem List Patient Active Problem List   Diagnosis Date Noted  . Arthritis of right knee 12/23/2019  . Chronic gastritis 01/28/2018  . GI bleed 01/07/2018  . Unilateral primary osteoarthritis, right knee 12/12/2017  . Chronic prescription opiate use 12/12/2017  . Chronic pain syndrome 12/12/2017  . Atrial fibrillation ([place]) 05/13/2017  . Gout, arthritis 05/13/2017  . Osteoarthritis of left knee 05/13/2017  .  Morbid obesity ([place]) 05/13/2017  . Chronic anticoagulation 05/13/2017  . History of knee joint replacement 05/13/2017  . Chronic use of opiate drugs therapeutic purposes 05/13/2017  . Anosmia 06/20/2016  . OSA (obstructive sleep apnea) 06/20/2016     Thank you for your referral.   Joneen Boers PT, DPT    03/22/2020, 5:13 PM  [place] [facility]ORTS MEDICINE [address], [place], [address] Phone: [phone]   Fax:  [phone]  Name: Leon Thomas MRN: [id number] Date of Birth: 11-30-1955

## 2020-03-24 ENCOUNTER — Ambulatory Visit (INDEPENDENT_AMBULATORY_CARE_PROVIDER_SITE_OTHER): Payer: Medicare Other | Admitting: Orthopedic Surgery

## 2020-03-24 ENCOUNTER — Encounter: Payer: Self-pay | Admitting: Orthopedic Surgery

## 2020-03-24 VITALS — Ht 74.0 in | Wt 361.0 lb

## 2020-03-24 DIAGNOSIS — Z96651 Presence of right artificial knee joint: Secondary | ICD-10-CM

## 2020-03-24 NOTE — Progress Notes (Signed)
Office Visit Note   Patient: Leon Thomas           Date of Birth: [birth year]           MRN: [id number] Visit Date: 03/24/2020              Requested by: Doristine Bosworth, MD [phone] [address] [place],  [place] [address] PCP: Doristine Bosworth, MD  Chief Complaint  Patient presents with  . Right Knee - Routine Post Op    12/23/19 right total knee replacement       HPI: Patient is a 64 year old gentleman who is 3 months status post right total knee arthroplasty he states he feels well he feels like he is back to normal he can carry groceries and walk up and down stairs he has completed his physical therapy and has returned to work as a Economist & Plan: Visit Diagnoses:  1. Status post total right knee replacement     Plan: Patient was given instructions to continue working on knee extension scar massage and strengthening  Follow-Up Instructions: No follow-ups on file.   Ortho Exam  Patient is alert, oriented, no adenopathy, well-dressed, normal affect, normal respiratory effort. Examination patient lacks about 5 degrees to full extension there is slight keloiding to the scar he has no pain with range of motion of his knee he ambulates without a limp  Imaging: No results found. No images are attached to the encounter.  Labs: Lab Results  Component Value Date   HGBA1C 5.6 07/21/2018   HGBA1C 5.2 05/13/2017     Lab Results  Component Value Date   ALBUMIN 3.9 12/16/2019   ALBUMIN 4.4 03/16/2019   ALBUMIN 3.9 07/21/2018    No results found for: MG No results found for: VD25OH  No results found for: PREALBUMIN CBC EXTENDED Latest Ref Rng & Units 12/16/2019 02/20/2018 01/09/2018  WBC 4.0 - 10.5 K/uL 5.2 5.9 4.9  RBC 4.22 - 5.81 MIL/uL 5.36 4.87 4.03(L)  HGB 13.0 - 17.0 g/dL 25.8 52.7 12.5(L)  HCT 39 - 52 % 47.7 43.4 36.4(L)  PLT 150 - 400 K/uL 195 174 134(L)  NEUTROABS 1.7 - 7.7 K/uL - - -  LYMPHSABS 0.7 - 4.0 K/uL - - -     Body  mass index is 46.35 kg/m.  Orders:  No orders of the defined types were placed in this encounter.  No orders of the defined types were placed in this encounter.    Procedures: No procedures performed  Clinical Data: No additional findings.  ROS:  All other systems negative, except as noted in the HPI. Review of Systems  Objective: Vital Signs: Ht 6\' 2"  (1.88 m)   Wt (!) 361 lb (163.7 kg)   BMI 46.35 kg/m   Specialty Comments:  No specialty comments available.  PMFS History: Patient Active Problem List   Diagnosis Date Noted  . Arthritis of right knee 12/23/2019  . Chronic gastritis 01/28/2018  . GI bleed 01/07/2018  . Unilateral primary osteoarthritis, right knee 12/12/2017  . Chronic prescription opiate use 12/12/2017  . Chronic pain syndrome 12/12/2017  . Atrial fibrillation (HCC) 05/13/2017  . Gout, arthritis 05/13/2017  . Osteoarthritis of left knee 05/13/2017  . Morbid obesity (HCC) 05/13/2017  . Chronic anticoagulation 05/13/2017  . History of knee joint replacement 05/13/2017  . Chronic use of opiate drugs therapeutic purposes 05/13/2017  . Anosmia 06/20/2016  . OSA (obstructive sleep apnea) 06/20/2016  Past Medical History:  Diagnosis Date  . Arthritis    knees  . CHF (congestive heart failure) (HCC)     " A long time ago"  . Dysrhythmia    04-21-13 Cardioverted at [facility] for A. Fib-Dr. Jacinto Halim. No further problems  . GI bleed   . Gout   . Hypertension   . Rotator cuff tear, right   . Sleep apnea    cpap nightly, settings 3    Family History  Problem Relation Age of Onset  . Diabetes Mother   . Alzheimer's disease Mother   . Diabetes Father   . Diabetes Brother   . Stroke Brother   . Stroke Sister     Past Surgical History:  Procedure Laterality Date  . BIOPSY  01/09/2018   Procedure: BIOPSY;  Surgeon: Kathi Der, MD;  Location: [facility];  Service: Gastroenterology;;  . CARDIOVERSION N/A 04/21/2013   Procedure:  CARDIOVERSION;  Surgeon: [name], MD;  Location: [facility] ENDOSCOPY;  Service: Cardiovascular;  Laterality: N/A;  h&p in file- HW  . COLONOSCOPY WITH PROPOFOL N/A 09/04/2013   Procedure: COLONOSCOPY WITH PROPOFOL;  Surgeon: Vertell Novak., MD;  [facility] ENDOSCOPY;  Service: Endoscopy;  Laterality: N/A;  . COLONOSCOPY WITH PROPOFOL N/A 06/01/2019   Procedure: COLONOSCOPY WITH PROPOFOL;  Surgeon: Carman Ching, MD;  Location: [facility] ENDOSCOPY;  Service: Endoscopy;  Laterality: N/A;  . ESOPHAGOGASTRODUODENOSCOPY (EGD) WITH PROPOFOL N/A 01/09/2018   Procedure: ESOPHAGOGASTRODUODENOSCOPY (EGD) WITH PROPOFOL;  Surgeon: Kathi Der, MD;  Location: [facility] ENDOSCOPY;  Service: Gastroenterology;  Laterality: N/A;  . FOOT SURGERY Left   . FRACTURE SURGERY    . JOINT REPLACEMENT Left   . ORIF ANKLE FRACTURE Right   . SHOULDER ARTHROSCOPY WITH ROTATOR CUFF REPAIR AND SUBACROMIAL DECOMPRESSION Right 03/20/2018   Procedure: SHOULDER ARTHROSCOPY WITH ROTATOR CUFF REPAIR AND SUBACROMIAL DECOMPRESSION;  Surgeon: [name], MD;  Location: [facility] OR;  Service: Orthopedics;  Laterality: Right;  . TOTAL KNEE ARTHROPLASTY Right 12/23/2019   Procedure: RIGHT TOTAL KNEE ARTHROPLASTY;  Surgeon: Nadara Mustard, MD;  Location[facility] OR;  Service: Orthopedics;  Laterality: Right;   Social History   Occupational History  . Not on file  Tobacco Use  . Smoking status: Never Smoker  . Smokeless tobacco: Never Used  Vaping Use  . Vaping Use: Never used  Substance and Sexual Activity  . Alcohol use: Yes    Alcohol/week: 10.0 standard drinks    Types: 8 Cans of beer, 2 Shots of liquor per week  . Drug use: No  . Sexual activity: Not on file

## 2022-09-06 ENCOUNTER — Emergency Department (HOSPITAL_COMMUNITY): Payer: 59

## 2022-09-06 ENCOUNTER — Encounter (HOSPITAL_COMMUNITY): Payer: Self-pay

## 2022-09-06 ENCOUNTER — Emergency Department (HOSPITAL_COMMUNITY)
Admission: EM | Admit: 2022-09-06 | Discharge: 2022-09-06 | Disposition: A | Discharge status: Home / Self Care | Payer: 59 | Attending: Emergency Medicine | Admitting: Emergency Medicine

## 2022-09-06 ENCOUNTER — Other Ambulatory Visit: Payer: Self-pay

## 2022-09-06 DIAGNOSIS — Z7901 Long term (current) use of anticoagulants: Secondary | ICD-10-CM | POA: Insufficient documentation

## 2022-09-06 DIAGNOSIS — R109 Unspecified abdominal pain: Secondary | ICD-10-CM | POA: Insufficient documentation

## 2022-09-06 DIAGNOSIS — Z79899 Other long term (current) drug therapy: Secondary | ICD-10-CM | POA: Diagnosis not present

## 2022-09-06 DIAGNOSIS — R0789 Other chest pain: Secondary | ICD-10-CM | POA: Insufficient documentation

## 2022-09-06 DIAGNOSIS — I509 Heart failure, unspecified: Secondary | ICD-10-CM | POA: Insufficient documentation

## 2022-09-06 DIAGNOSIS — Y92410 Unspecified street and highway as the place of occurrence of the external cause: Secondary | ICD-10-CM | POA: Insufficient documentation

## 2022-09-06 DIAGNOSIS — I110 Hypertensive heart disease with heart failure: Secondary | ICD-10-CM | POA: Diagnosis not present

## 2022-09-06 DIAGNOSIS — S0181XA Laceration without foreign body of other part of head, initial encounter: Secondary | ICD-10-CM | POA: Insufficient documentation

## 2022-09-06 LAB — CBC
HCT: 36.9 % — ABNORMAL LOW (ref 39.0–52.0)
Hemoglobin: 12.2 g/dL — ABNORMAL LOW (ref 13.0–17.0)
MCH: 29.3 pg (ref 26.0–34.0)
MCHC: 33.1 g/dL (ref 30.0–36.0)
MCV: 88.7 fL (ref 80.0–100.0)
Platelets: 210 10*3/uL (ref 150–400)
RBC: 4.16 MIL/uL — ABNORMAL LOW (ref 4.22–5.81)
RDW: 15 % (ref 11.5–15.5)
WBC: 5.6 10*3/uL (ref 4.0–10.5)
nRBC: 0 % (ref 0.0–0.2)

## 2022-09-06 LAB — I-STAT CHEM 8, ED
BUN: 8 mg/dL (ref 8–23)
Calcium, Ion: 1.17 mmol/L (ref 1.15–1.40)
Chloride: 103 mmol/L (ref 98–111)
Creatinine, Ser: 1 mg/dL (ref 0.61–1.24)
Glucose, Bld: 81 mg/dL (ref 70–99)
HCT: 40 % (ref 39.0–52.0)
Hemoglobin: 13.6 g/dL (ref 13.0–17.0)
Potassium: 3.7 mmol/L (ref 3.5–5.1)
Sodium: 142 mmol/L (ref 135–145)
TCO2: 25 mmol/L (ref 22–32)

## 2022-09-06 MED ORDER — MORPHINE SULFATE (PF) 4 MG/ML IV SOLN
4.0000 mg | Freq: Once | INTRAVENOUS | Status: AC
  Administered 2022-09-06: 4 mg via INTRAVENOUS
  Filled 2022-09-06: qty 1

## 2022-09-06 MED ORDER — LABETALOL HCL 5 MG/ML IV SOLN
10.0000 mg | Freq: Once | INTRAVENOUS | Status: AC
  Administered 2022-09-06: 10 mg via INTRAVENOUS
  Filled 2022-09-06: qty 4

## 2022-09-06 MED ORDER — IOHEXOL 300 MG/ML  SOLN
100.0000 mL | Freq: Once | INTRAMUSCULAR | Status: AC | PRN
  Administered 2022-09-06: 100 mL via INTRAVENOUS
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
## 2023-04-03 ENCOUNTER — Ambulatory Visit: Admission: RE | Admit: 2023-04-03 | Payer: 59 | Source: Ambulatory Visit

## 2023-04-03 ENCOUNTER — Other Ambulatory Visit: Payer: Self-pay | Admitting: Family Medicine

## 2023-04-03 DIAGNOSIS — R600 Localized edema: Secondary | ICD-10-CM
